# Patient Record
Sex: Female | Born: 1970 | Race: Black or African American | Hispanic: No | Marital: Single | Smoking: Current every day smoker
Health system: Southern US, Community
[De-identification: ages and names within clinical notes are randomized; demographics above are authoritative.]

## PROBLEM LIST (undated history)

## (undated) DIAGNOSIS — F32A Depression, unspecified: Secondary | ICD-10-CM

## (undated) DIAGNOSIS — G5600 Carpal tunnel syndrome, unspecified upper limb: Secondary | ICD-10-CM

## (undated) DIAGNOSIS — M1990 Unspecified osteoarthritis, unspecified site: Secondary | ICD-10-CM

## (undated) DIAGNOSIS — E119 Type 2 diabetes mellitus without complications: Secondary | ICD-10-CM

## (undated) DIAGNOSIS — E669 Obesity, unspecified: Secondary | ICD-10-CM

## (undated) DIAGNOSIS — J45909 Unspecified asthma, uncomplicated: Secondary | ICD-10-CM

## (undated) DIAGNOSIS — K219 Gastro-esophageal reflux disease without esophagitis: Secondary | ICD-10-CM

## (undated) DIAGNOSIS — F419 Anxiety disorder, unspecified: Secondary | ICD-10-CM

## (undated) DIAGNOSIS — F329 Major depressive disorder, single episode, unspecified: Secondary | ICD-10-CM

## (undated) DIAGNOSIS — M549 Dorsalgia, unspecified: Secondary | ICD-10-CM

## (undated) HISTORY — PX: TUBAL LIGATION: SHX77

## (undated) HISTORY — DX: Carpal tunnel syndrome, unspecified upper limb: G56.00

## (undated) HISTORY — DX: Major depressive disorder, single episode, unspecified: F32.9

## (undated) HISTORY — DX: Unspecified osteoarthritis, unspecified site: M19.90

## (undated) HISTORY — PX: CHOLECYSTECTOMY: SHX55

## (undated) HISTORY — DX: Gastro-esophageal reflux disease without esophagitis: K21.9

## (undated) HISTORY — DX: Type 2 diabetes mellitus without complications: E11.9

## (undated) HISTORY — DX: Depression, unspecified: F32.A

---

## 2001-12-16 ENCOUNTER — Encounter: Payer: Self-pay | Admitting: *Deleted

## 2001-12-16 ENCOUNTER — Emergency Department (HOSPITAL_COMMUNITY): Admission: EM | Admit: 2001-12-16 | Discharge: 2001-12-16 | Payer: Self-pay | Admitting: *Deleted

## 2002-01-20 ENCOUNTER — Emergency Department (HOSPITAL_COMMUNITY): Admission: EM | Admit: 2002-01-20 | Discharge: 2002-01-20 | Payer: Self-pay | Admitting: Emergency Medicine

## 2005-01-03 ENCOUNTER — Emergency Department (HOSPITAL_COMMUNITY): Admission: EM | Admit: 2005-01-03 | Discharge: 2005-01-03 | Payer: Self-pay | Admitting: Emergency Medicine

## 2005-06-23 ENCOUNTER — Emergency Department (HOSPITAL_COMMUNITY): Admission: EM | Admit: 2005-06-23 | Discharge: 2005-06-23 | Payer: Self-pay | Admitting: Emergency Medicine

## 2005-09-12 ENCOUNTER — Emergency Department (HOSPITAL_COMMUNITY): Admission: EM | Admit: 2005-09-12 | Discharge: 2005-09-12 | Payer: Self-pay | Admitting: Emergency Medicine

## 2005-10-27 ENCOUNTER — Emergency Department (HOSPITAL_COMMUNITY): Admission: EM | Admit: 2005-10-27 | Discharge: 2005-10-27 | Payer: Self-pay | Admitting: Emergency Medicine

## 2006-06-23 ENCOUNTER — Emergency Department (HOSPITAL_COMMUNITY): Admission: EM | Admit: 2006-06-23 | Discharge: 2006-06-24 | Payer: Self-pay | Admitting: Emergency Medicine

## 2006-11-03 ENCOUNTER — Emergency Department (HOSPITAL_COMMUNITY): Admission: EM | Admit: 2006-11-03 | Discharge: 2006-11-03 | Payer: Self-pay | Admitting: *Deleted

## 2008-09-10 ENCOUNTER — Emergency Department (HOSPITAL_COMMUNITY): Admission: EM | Admit: 2008-09-10 | Discharge: 2008-09-10 | Payer: Self-pay | Admitting: Emergency Medicine

## 2010-05-19 ENCOUNTER — Emergency Department (HOSPITAL_COMMUNITY): Admission: EM | Admit: 2010-05-19 | Discharge: 2010-05-19 | Payer: Self-pay | Admitting: Emergency Medicine

## 2010-12-01 LAB — URINALYSIS, ROUTINE W REFLEX MICROSCOPIC
Glucose, UA: NEGATIVE mg/dL
Hgb urine dipstick: NEGATIVE
Ketones, ur: NEGATIVE mg/dL
Protein, ur: NEGATIVE mg/dL
pH: 5.5 (ref 5.0–8.0)

## 2010-12-01 LAB — CBC
HCT: 40.4 % (ref 36.0–46.0)
Hemoglobin: 13.2 g/dL (ref 12.0–15.0)
MCV: 91.3 fL (ref 78.0–100.0)
Platelets: 239 10*3/uL (ref 150–400)
WBC: 11.8 10*3/uL — ABNORMAL HIGH (ref 4.0–10.5)

## 2010-12-01 LAB — DIFFERENTIAL
Basophils Absolute: 0.1 10*3/uL (ref 0.0–0.1)
Basophils Relative: 1 % (ref 0–1)
Lymphocytes Relative: 31 % (ref 12–46)
Monocytes Absolute: 0.5 10*3/uL (ref 0.1–1.0)
Neutro Abs: 7.5 10*3/uL (ref 1.7–7.7)
Neutrophils Relative %: 63 % (ref 43–77)

## 2010-12-01 LAB — PREGNANCY, URINE: Preg Test, Ur: NEGATIVE

## 2010-12-01 LAB — COMPREHENSIVE METABOLIC PANEL
Albumin: 3.7 g/dL (ref 3.5–5.2)
Alkaline Phosphatase: 77 U/L (ref 39–117)
BUN: 10 mg/dL (ref 6–23)
Chloride: 102 mEq/L (ref 96–112)
Creatinine, Ser: 0.83 mg/dL (ref 0.4–1.2)
GFR calc non Af Amer: >60 mL/min (ref >60)
Glucose, Bld: 104 mg/dL — ABNORMAL HIGH (ref 70–99)
Potassium: 3.6 mEq/L (ref 3.5–5.1)
Total Bilirubin: 0.3 mg/dL (ref 0.3–1.2)

## 2011-11-06 ENCOUNTER — Encounter (HOSPITAL_COMMUNITY): Payer: Self-pay

## 2011-11-06 ENCOUNTER — Emergency Department (HOSPITAL_COMMUNITY): Payer: Self-pay

## 2011-11-06 ENCOUNTER — Emergency Department (HOSPITAL_COMMUNITY)
Admission: EM | Admit: 2011-11-06 | Discharge: 2011-11-06 | Disposition: A | Discharge status: Home Health | Payer: Self-pay | Attending: Emergency Medicine | Admitting: Emergency Medicine

## 2011-11-06 DIAGNOSIS — F17200 Nicotine dependence, unspecified, uncomplicated: Secondary | ICD-10-CM | POA: Insufficient documentation

## 2011-11-06 DIAGNOSIS — R040 Epistaxis: Secondary | ICD-10-CM | POA: Insufficient documentation

## 2011-11-06 DIAGNOSIS — IMO0001 Reserved for inherently not codable concepts without codable children: Secondary | ICD-10-CM | POA: Insufficient documentation

## 2011-11-06 DIAGNOSIS — R61 Generalized hyperhidrosis: Secondary | ICD-10-CM | POA: Insufficient documentation

## 2011-11-06 DIAGNOSIS — R209 Unspecified disturbances of skin sensation: Secondary | ICD-10-CM | POA: Insufficient documentation

## 2011-11-06 DIAGNOSIS — R059 Cough, unspecified: Secondary | ICD-10-CM | POA: Insufficient documentation

## 2011-11-06 DIAGNOSIS — R6883 Chills (without fever): Secondary | ICD-10-CM | POA: Insufficient documentation

## 2011-11-06 DIAGNOSIS — J40 Bronchitis, not specified as acute or chronic: Secondary | ICD-10-CM | POA: Insufficient documentation

## 2011-11-06 DIAGNOSIS — R112 Nausea with vomiting, unspecified: Secondary | ICD-10-CM | POA: Insufficient documentation

## 2011-11-06 DIAGNOSIS — M791 Myalgia, unspecified site: Secondary | ICD-10-CM

## 2011-11-06 DIAGNOSIS — R05 Cough: Secondary | ICD-10-CM | POA: Insufficient documentation

## 2011-11-06 LAB — COMPREHENSIVE METABOLIC PANEL
AST: 25 U/L (ref 0–37)
Albumin: 3.5 g/dL (ref 3.5–5.2)
Alkaline Phosphatase: 83 U/L (ref 39–117)
BUN: 5 mg/dL — ABNORMAL LOW (ref 6–23)
CO2: 25 mEq/L (ref 19–32)
Chloride: 104 mEq/L (ref 96–112)
Creatinine, Ser: 0.7 mg/dL (ref 0.50–1.10)
GFR calc non Af Amer: >90 mL/min (ref >90)
Potassium: 3.9 mEq/L (ref 3.5–5.1)
Total Bilirubin: 0.1 mg/dL — ABNORMAL LOW (ref 0.3–1.2)

## 2011-11-06 LAB — DIFFERENTIAL
Basophils Absolute: 0.1 10*3/uL (ref 0.0–0.1)
Basophils Relative: 1 % (ref 0–1)
Eosinophils Absolute: 0.1 10*3/uL (ref 0.0–0.7)
Eosinophils Relative: 3 % (ref 0–5)
Lymphocytes Relative: 46 % (ref 12–46)
Lymphs Abs: 2.1 10*3/uL (ref 0.7–4.0)
Monocytes Absolute: 0.7 10*3/uL (ref 0.1–1.0)
Monocytes Relative: 15 % — ABNORMAL HIGH (ref 3–12)
Neutro Abs: 1.6 10*3/uL — ABNORMAL LOW (ref 1.7–7.7)
Neutrophils Relative %: 35 % — ABNORMAL LOW (ref 43–77)

## 2011-11-06 LAB — CBC
HCT: 41.6 % (ref 36.0–46.0)
Hemoglobin: 13.6 g/dL (ref 12.0–15.0)
MCHC: 32.7 g/dL (ref 30.0–36.0)
RBC: 4.69 MIL/uL (ref 3.87–5.11)
WBC: 4.5 10*3/uL (ref 4.0–10.5)

## 2011-11-06 MED ORDER — KETOROLAC TROMETHAMINE 60 MG/2ML IM SOLN
60.0000 mg | Freq: Once | INTRAMUSCULAR | Status: AC
  Administered 2011-11-06: 60 mg via INTRAMUSCULAR
  Filled 2011-11-06: qty 2

## 2011-11-06 MED ORDER — HYDROCODONE-ACETAMINOPHEN 5-325 MG PO TABS: 2.0000 | ORAL_TABLET | ORAL | Status: AC | PRN

## 2011-11-06 MED ORDER — ONDANSETRON 4 MG PO TBDP
4.0000 mg | ORAL_TABLET | Freq: Once | ORAL | Status: AC
  Administered 2011-11-06: 4 mg via ORAL
  Filled 2011-11-06: qty 1

## 2011-11-06 MED ORDER — AZITHROMYCIN 250 MG PO TABS: 250.0000 mg | ORAL_TABLET | Freq: Every day | ORAL | Status: AC

## 2011-11-06 MED ORDER — PROMETHAZINE HCL 25 MG PO TABS: 25.0000 mg | ORAL_TABLET | Freq: Four times a day (QID) | ORAL | Status: DC | PRN

## 2011-11-06 NOTE — ED Notes (Signed)
Patient states that since last week she had a stomach virus with n/v/d. She states that in the past 3 days she has now developed left sided numbness on her left arm as well. She states that this morning when she woke up she had a nosebleed that was severe in nature. Pt thinks that she still has the stomach virus and the flu. She describes chills and sweats. She states that she has been having productive cough as well.

## 2011-11-06 NOTE — ED Provider Notes (Signed)
History     CSN: [id number]  Arrival date & time 11/06/11  9518   First MD Initiated Contact with Patient 11/06/11 [phone]      Chief Complaint  Patient presents with  . Influenza  . Numbness     HPI Patient states that since last week she had a stomach virus with n/v/d. She states that in the past 3 days she has now developed left sided numbness on her left arm as well. She states that this morning when she woke up she had a nosebleed that was severe in nature. Pt thinks that she still has the stomach virus and the flu. She describes chills and sweats. She states that she has been having productive cough as well.   History reviewed. No pertinent past medical history.  Past Surgical History  Procedure Date  . Cholecystectomy     History reviewed. No pertinent family history.  History  Substance Use Topics  . Smoking status: Current Everyday Smoker -- 1.0 packs/day  . Smokeless tobacco: Not on file  . Alcohol Use: No    OB History    Grav Para Term Preterm Abortions TAB SAB Ect Mult Living                  Review of Systems Negative except as noted in history of present illness Allergies  Penicillins  Home Medications   Current Outpatient Rx  Name Route Sig Dispense Refill  . ACETAMINOPHEN 500 MG PO TABS Oral Take 500 mg by mouth every 6 (six) hours as needed. Pain/ fever    . IBUPROFEN 200 MG PO TABS Oral Take 400 mg by mouth every 6 (six) hours as needed. Pain/fever    . DAYQUIL MULTI-SYMPTOM PO Oral Take 2 tablets by mouth every 6 (six) hours as needed. For cold    . AZITHROMYCIN 250 MG PO TABS Oral Take 1 tablet (250 mg total) by mouth daily. Take 2 tablets on day 1 then take 1 tablet daily until gone 66 tablet 0  . HYDROCODONE-ACETAMINOPHEN 5-325 MG PO TABS Oral Take 2 tablets by mouth every 4 (four) hours as needed for pain. 10 tablet 0  . PROMETHAZINE HCL 25 MG PO TABS Oral Take 1 tablet (25 mg total) by mouth every 6 (six) hours as needed for nausea. 15  tablet 0    BP 102/57  Pulse 83  Temp(Src) 99.9 F (37.7 C) (Oral)  Resp 22  SpO2 97%  LMP 11/06/2011  Physical Exam  Nursing note and vitals reviewed. Constitutional: She is oriented to person, place, and time. She appears well-developed and well-nourished. No distress.  HENT:  Head: Normocephalic and atraumatic.  Eyes: Pupils are equal, round, and reactive to light.  Neck: Normal range of motion.  Cardiovascular: Normal rate and intact distal pulses.   Pulmonary/Chest: No respiratory distress. She has no wheezes. She has no rales.  Abdominal: Normal appearance. She exhibits no distension. There is no tenderness. There is no rebound and no guarding.  Musculoskeletal: Normal range of motion.  Neurological: She is alert and oriented to person, place, and time. No cranial nerve deficit.  Skin: Skin is warm and dry. No rash noted.  Psychiatric: She has a normal mood and affect. Her behavior is normal.    ED Course  Procedures (including critical care time)  Labs Reviewed  DIFFERENTIAL - Abnormal; Notable for the following:    Neutrophils Relative 35 (*)    Neutro Abs 1.6 (*)    Monocytes Relative 15 (*)  All other components within normal limits  COMPREHENSIVE METABOLIC PANEL - Abnormal; Notable for the following:    Glucose, Bld 113 (*)    BUN 5 (*)    Total Bilirubin 0.1 (*)    All other components within normal limits  CBC   Dg Chest 2 View  11/06/2011  *RADIOLOGY REPORT*  Clinical Data: Productive cough, chest soreness, smoking history  CHEST - 2 VIEW  Comparison: None.  Findings: No pneumonia is seen.  There are somewhat prominent perihilar markings with peribronchial thickening which may indicate bronchitis.  The heart is mildly enlarged.  No bony abnormality is seen.  IMPRESSION: Prominent perihilar markings may indicate bronchitis.  No pneumonia is seen.  Original Report Authenticated By: Juline Patch, M.D.     1. Nausea and vomiting   2. Myalgia   3.  Bronchitis       MDM          Nelia Shi, MD 11/06/11 1135

## 2011-11-06 NOTE — Discharge Instructions (Signed)
B.R.A.T. Diet Your doctor has recommended the B.R.A.T. diet for you or your child until the condition improves. This is often used to help control diarrhea and vomiting symptoms. If you or your child can tolerate clear liquids, you may have:  Bananas.   Rice.   Applesauce.   Toast (and other simple starches such as crackers, potatoes, noodles).  Be sure to avoid dairy products, meats, and fatty foods until symptoms are better. Fruit juices such as apple, grape, and prune juice can make diarrhea worse. Avoid these. Continue this diet for 2 days or as instructed by your caregiver. Document Released: 08/03/2005 Document Revised: 07/23/2011 Document Reviewed: 01/20/2007 [facility] Patient Information 2012 [place], [place].Bronchitis Bronchitis is the body's way of reacting to injury and/or infection (inflammation) of the bronchi. Bronchi are the air tubes that extend from the windpipe into the lungs. If the inflammation becomes severe, it may cause shortness of breath. CAUSES  Inflammation may be caused by:  A virus.   Germs (bacteria).   Dust.   Allergens.   Pollutants and many other irritants.  The cells lining the bronchial tree are covered with tiny hairs (cilia). These constantly beat upward, away from the lungs, toward the mouth. This keeps the lungs free of pollutants. When these cells become too irritated and are unable to do their job, mucus begins to develop. This causes the characteristic cough of bronchitis. The cough clears the lungs when the cilia are unable to do their job. Without either of these protective mechanisms, the mucus would settle in the lungs. Then you would develop pneumonia. Smoking is a common cause of bronchitis and can contribute to pneumonia. Stopping this habit is the single most important thing you can do to help yourself. TREATMENT   Your caregiver may prescribe an antibiotic if the cough is caused by bacteria. Also, medicines that open up your airways make  it easier to breathe. Your caregiver may also recommend or prescribe an expectorant. It will loosen the mucus to be coughed up. Only take over-the-counter or prescription medicines for pain, discomfort, or fever as directed by your caregiver.   Removing whatever causes the problem (smoking, for example) is critical to preventing the problem from getting worse.   Cough suppressants may be prescribed for relief of cough symptoms.   Inhaled medicines may be prescribed to help with symptoms now and to help prevent problems from returning.   For those with recurrent (chronic) bronchitis, there may be a need for steroid medicines.  SEEK IMMEDIATE MEDICAL CARE IF:   During treatment, you develop more pus-like mucus (purulent sputum).   You have a fever.   Your baby is older than 3 months with a rectal temperature of 102 F (38.9 C) or higher.   Your baby is 28 months old or younger with a rectal temperature of 100.4 F (38 C) or higher.   You become progressively more ill.   You have increased difficulty breathing, wheezing, or shortness of breath.  It is necessary to seek immediate medical care if you are elderly or sick from any other disease. MAKE SURE YOU:   Understand these instructions.   Will watch your condition.   Will get help right away if you are not doing well or get worse.  Document Released: 08/03/2005 Document Revised: 07/23/2011 Document Reviewed: 06/12/2008 [facility] Patient Information 2012 [place], [place].

## 2011-11-06 NOTE — ED Notes (Signed)
Patient transported to X-ray 

## 2011-11-06 NOTE — ED Notes (Addendum)
Pt reports this week nausea, vomiting, diarrhea but these symptoms have since stopped x 2 days. Today, pt c/o headache, chills, cough, "spitting up blood". Pt reports nosebleed this morning, since then has been coughing up blood. Pt skin feels warm. Pt has mask in place. Family at bedside. Pt c/o "aches". Pt also c/o "numbness" in left arm. No neuro deficits noted.

## 2012-05-04 ENCOUNTER — Encounter: Payer: Self-pay | Admitting: Family Medicine

## 2012-05-04 ENCOUNTER — Ambulatory Visit (INDEPENDENT_AMBULATORY_CARE_PROVIDER_SITE_OTHER): Payer: Self-pay | Admitting: Family Medicine

## 2012-05-04 DIAGNOSIS — F329 Major depressive disorder, single episode, unspecified: Secondary | ICD-10-CM

## 2012-05-04 DIAGNOSIS — Z720 Tobacco use: Secondary | ICD-10-CM

## 2012-05-04 DIAGNOSIS — G8929 Other chronic pain: Secondary | ICD-10-CM

## 2012-05-04 DIAGNOSIS — F32A Depression, unspecified: Secondary | ICD-10-CM

## 2012-05-04 DIAGNOSIS — F17200 Nicotine dependence, unspecified, uncomplicated: Secondary | ICD-10-CM

## 2012-05-04 MED ORDER — IBUPROFEN 600 MG PO TABS: 600.0000 mg | ORAL_TABLET | Freq: Three times a day (TID) | ORAL | Status: DC | PRN

## 2012-05-04 NOTE — Patient Instructions (Addendum)
Nice to meet you! Please bring in your empty pill bottles to next visit so I can know the name of your depression medication. You might have sleep apnea, and we need to do some bloodwork and pap test (physical exam). Make sure to get the orange card, so we can do the testing you need! Try to stay active and get as much exercise as possible for weight loss. Make an appointment as soon as you can after the orange card.   Degenerative Arthritis You have osteoarthritis. This is the wear and tear arthritis that comes with aging. It is also called degenerative arthritis. This is common in people past middle age. It is caused by stress on the joints. The large weight bearing joints of the lower extremities are most often affected. The knees, hips, back, neck, and hands can become painful, swollen, and stiff. This is the most common type of arthritis. It comes on with age, carrying too much weight, or from an injury. Treatment includes resting the sore joint until the pain and swelling improve. Crutches or a walker may be needed for severe flares. Only take over-the-counter or prescription medicines for pain, discomfort, or fever as directed by your caregiver. Local heat therapy may improve motion. Cortisone shots into the joint are sometimes used to reduce pain and swelling during flares. Osteoarthritis is usually not crippling and progresses slowly. There are things you can do to decrease pain:  Avoid high impact activities.   Exercise regularly.   Low impact exercises such as walking, biking and swimming help to keep the muscles strong and keep normal joint function.   Stretching helps to keep your range of motion.   Lose weight if you are overweight. This reduces joint stress.  In severe cases when you have pain at rest or increasing disability, joint surgery may be helpful. See your caregiver for follow-up treatment as recommended.  SEEK IMMEDIATE MEDICAL CARE IF:   You have severe joint pain.     Marked swelling and redness in your joint develops.   You develop a high fever.  Document Released: 08/03/2005 Document Revised: 07/23/2011 Document Reviewed: 01/03/2007 [facility] Patient Information 2012 [place], [place].
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
## 2012-05-04 NOTE — Assessment & Plan Note (Signed)
Discussed weight as an exacerbating factor for joint pains and likely sleep apnea. She describes symptoms consistent with apnea, will plan to obtain a sleep study once she is approved for the orange card. Reinforced weight loss currently. Consider nutrition versus bariatric referral, though not likely unable to afford this. Will check A1c, lipids, TSH at next visit.

## 2012-05-04 NOTE — Assessment & Plan Note (Signed)
Complains of chronic polyarticular symmetric pain. Has not seen a doctor in greater than 10 years. There are plain films of her knees taken 10 years ago that looked normal. Will start trial of NSAID in addition to her Tylenol. Has followup planned to screen for inflammatory conditions with ESR, CBC differential, cmet, TSH. Possibly this represents simple arthritic changes related to morbid obesity. Followup as soon as orange card is obtained.

## 2012-05-04 NOTE — Assessment & Plan Note (Signed)
Symptoms are not bothersome to patient currently. Advised her to bring old pill bottles to next visit, so that this medication may be re-prescribed if beneficial, may try to target chronic pain with a TCA or SNRI. Check PHQ-9 and MDQ at next visit.

## 2012-05-05 ENCOUNTER — Telehealth: Payer: Self-pay | Admitting: Family Medicine

## 2012-05-05 NOTE — Telephone Encounter (Signed)
Pt called to say she couldn't get the info for [employer] and she wanted to know what she could do.  States that the visit yesterday was not a real office visit and she wanted a refund of her $19 - the doctor would give her what she needed and she is upset that we aren't going to help her. I explained what the process is and that she would need to help get the info together (she hasn't filed taxes since 1998) and something about her food stamps verification. She states that she is not coming back here so I have taken her off as our patient at [facility] medicine.

## 2013-01-13 ENCOUNTER — Encounter (HOSPITAL_COMMUNITY): Payer: Self-pay | Admitting: Emergency Medicine

## 2013-01-13 ENCOUNTER — Emergency Department (HOSPITAL_COMMUNITY)
Admission: EM | Admit: 2013-01-13 | Discharge: 2013-01-13 | Disposition: A | Discharge status: Home / Self Care | Payer: Self-pay | Attending: Emergency Medicine | Admitting: Emergency Medicine

## 2013-01-13 DIAGNOSIS — M170 Bilateral primary osteoarthritis of knee: Secondary | ICD-10-CM

## 2013-01-13 DIAGNOSIS — IMO0002 Reserved for concepts with insufficient information to code with codable children: Secondary | ICD-10-CM | POA: Insufficient documentation

## 2013-01-13 DIAGNOSIS — Z880 Allergy status to penicillin: Secondary | ICD-10-CM | POA: Insufficient documentation

## 2013-01-13 DIAGNOSIS — F17200 Nicotine dependence, unspecified, uncomplicated: Secondary | ICD-10-CM | POA: Insufficient documentation

## 2013-01-13 DIAGNOSIS — M1710 Unilateral primary osteoarthritis, unspecified knee: Secondary | ICD-10-CM | POA: Insufficient documentation

## 2013-01-13 DIAGNOSIS — Z8659 Personal history of other mental and behavioral disorders: Secondary | ICD-10-CM | POA: Insufficient documentation

## 2013-01-13 HISTORY — DX: Obesity, unspecified: E66.9

## 2013-01-13 MED ORDER — IBUPROFEN 800 MG PO TABS
800.0000 mg | ORAL_TABLET | Freq: Once | ORAL | Status: AC
  Administered 2013-01-13: 800 mg via ORAL
  Filled 2013-01-13: qty 1

## 2013-01-13 NOTE — ED Provider Notes (Signed)
Medical screening examination/treatment/procedure(s) were performed by non-physician practitioner and as supervising physician I was immediately available for consultation/collaboration.   Joya Gaskins, MD 01/13/13 1013

## 2013-01-13 NOTE — ED Provider Notes (Signed)
History     CSN: [id number]  Arrival date & time 01/13/13  4098   First MD Initiated Contact with Patient 01/13/13 [phone]      Chief Complaint  Patient presents with  . Knee Pain    (Consider location/radiation/quality/duration/timing/severity/associated sxs/prior treatment) HPI  This is a morbidly obese 42 year old female with history of osteoarthritis presents complaining of bilateral knee pain.patient reports she has chronic knee pain for many years. Pain is described as a sharp and throbbing sensation to bilateral knee, worsening with weather changes. She usually takes Tylenol, using aspercreme for pain with minimal relief. No complaints of hip or ankle pain. Denies fever, swelling, or rash. Denies any recent trauma. She reports that her lawyer recommend coming to the ER for management of her knee pain in order to file for disability.  patient is able to ambulate without any assistive device  Past Medical History  Diagnosis Date  . Depression   . Arthritis     Past Surgical History  Procedure Laterality Date  . Cholecystectomy    . Tubal ligation      Family History  Problem Relation Age of Onset  . Depression Mother     bipolar/schizo  . Heart disease Mother   . Diabetes Mother   . Asthma Father   . Hyperlipidemia Father   . Hypertension Father   . Depression Maternal Grandmother   . Heart disease Maternal Grandmother   . Diabetes Maternal Grandmother   . Hypertension Maternal Grandfather     History  Substance Use Topics  . Smoking status: Current Every Day Smoker -- 1.00 packs/day  . Smokeless tobacco: Not on file  . Alcohol Use: No    OB History   Grav Para Term Preterm Abortions TAB SAB Ect Mult Living                  Review of Systems  Constitutional: Negative for fever.  Musculoskeletal: Positive for arthralgias. Negative for back pain and joint swelling.  Skin: Negative for rash and wound.  Neurological: Negative for numbness.    Allergies   Penicillins  Home Medications   Current Outpatient Rx  Name  Route  Sig  Dispense  Refill  . trolamine salicylate (ASPERCREME) 10 % cream   Topical   Apply 1 application topically as needed (For pain.).           There were no vitals taken for this visit.  Physical Exam  Nursing note and vitals reviewed. Constitutional:  Morbidly obese, appears to be in no acute distress  HENT:  Head: Normocephalic and atraumatic.  Neck: Neck supple.  Cardiovascular: Intact distal pulses.   Musculoskeletal: She exhibits tenderness (Generalized tenderness to bilateral anterior and posterior knee on palpation with normal range of motion, no joint laxity, no overlying skin changes, or edema noted). She exhibits no edema.       Right hip: Normal.       Left hip: Normal.       Right ankle: Normal.       Left ankle: Normal.  Neurological: She is alert.  Skin: Skin is warm. No rash noted.  Psychiatric: She has a normal mood and affect.    ED Course  Procedures (including critical care time)  9:18 AM Patient with polys arthralgia consistence with osteoarthritis. No red flags. Able to ambulate. Patient likely will benefit from diet, exercise, and weight loss along with rice therapy. [facility] for the  same complaint, was prescribed NSAIDs however patient unsatisfied with treatment. I do not believe narcotic pain medication is appropriate for this patient.  Time spent discussing weight reduction, diet and exercise, and followup with ortho as needed. Patient voiced understanding and agrees with plan. Patient stable for discharge.  Labs Reviewed - No data to display No results found.   1. Osteoarthritis of both knees       MDM  BP 95/57  Pulse 79  Temp(Src) 98.3 F (36.8 C)  Resp 16  SpO2 99%         Fayrene Helper, PA-C 01/13/13 [phone]

## 2013-01-13 NOTE — ED Notes (Signed)
Rt knee pain  X years but has hurt worse the last 4 months now has some swelling has never seen a dr for this hurts to walk it no injury she states

## 2013-07-04 ENCOUNTER — Emergency Department (HOSPITAL_COMMUNITY)
Admission: EM | Admit: 2013-07-04 | Discharge: 2013-07-05 | Disposition: A | Discharge status: Home / Self Care | Payer: Self-pay | Attending: Emergency Medicine | Admitting: Emergency Medicine

## 2013-07-04 ENCOUNTER — Encounter (HOSPITAL_COMMUNITY): Payer: Self-pay | Admitting: Emergency Medicine

## 2013-07-04 DIAGNOSIS — F17200 Nicotine dependence, unspecified, uncomplicated: Secondary | ICD-10-CM | POA: Insufficient documentation

## 2013-07-04 DIAGNOSIS — E669 Obesity, unspecified: Secondary | ICD-10-CM | POA: Insufficient documentation

## 2013-07-04 DIAGNOSIS — R112 Nausea with vomiting, unspecified: Secondary | ICD-10-CM | POA: Insufficient documentation

## 2013-07-04 DIAGNOSIS — Z880 Allergy status to penicillin: Secondary | ICD-10-CM | POA: Insufficient documentation

## 2013-07-04 DIAGNOSIS — Z8659 Personal history of other mental and behavioral disorders: Secondary | ICD-10-CM | POA: Insufficient documentation

## 2013-07-04 DIAGNOSIS — Z3202 Encounter for pregnancy test, result negative: Secondary | ICD-10-CM | POA: Insufficient documentation

## 2013-07-04 DIAGNOSIS — R109 Unspecified abdominal pain: Secondary | ICD-10-CM | POA: Insufficient documentation

## 2013-07-04 DIAGNOSIS — Z8739 Personal history of other diseases of the musculoskeletal system and connective tissue: Secondary | ICD-10-CM | POA: Insufficient documentation

## 2013-07-04 DIAGNOSIS — M549 Dorsalgia, unspecified: Secondary | ICD-10-CM | POA: Insufficient documentation

## 2013-07-04 MED ORDER — ONDANSETRON HCL 4 MG/2ML IJ SOLN
4.0000 mg | Freq: Once | INTRAMUSCULAR | Status: AC
  Administered 2013-07-05: 4 mg via INTRAVENOUS
  Filled 2013-07-04: qty 2

## 2013-07-04 MED ORDER — SODIUM CHLORIDE 0.9 % IV BOLUS (SEPSIS)
1000.0000 mL | Freq: Once | INTRAVENOUS | Status: AC
  Administered 2013-07-05: 1000 mL via INTRAVENOUS

## 2013-07-04 MED ORDER — KETOROLAC TROMETHAMINE 30 MG/ML IJ SOLN
30.0000 mg | Freq: Once | INTRAMUSCULAR | Status: AC
  Administered 2013-07-05: 30 mg via INTRAVENOUS
  Filled 2013-07-04: qty 1

## 2013-07-04 NOTE — ED Notes (Signed)
Pt with back and abd pain with nausea and also noted "speck of blood" on toliet paper per pt with last BM tonight, denies vomiting

## 2013-07-04 NOTE — ED Provider Notes (Signed)
CSN: [id number]     Arrival date & time 07/04/13  2235 History  This chart was scribed for Geoffery Lyons, MD by Ronal Fear, ED Scribe. This patient was seen in room APA11/APA11 and the patient's care was started at 11:33 PM.    Chief Complaint  Patient presents with  . Back Pain  . Abdominal Pain   (Consider location/radiation/quality/duration/timing/severity/associated sxs/prior Treatment) HPI  HPI Comments: Sandra Orr is a 42 y.o. female who presents to the Emergency Department complaining of sudden onset back pain for 4x days, with nausea. Pt states that she went to the bathroom today and during an episode of emesis she saw dark brown blood. She denies any heavy lifting, fever, diarrhea, or dysuria. She denies hx of UTI or bladder infection, and diverticulitis. Pt had her tubes tied 25 yrs ago.  She does not appear to be in any acute distress with no other complaints.    Past Medical History  Diagnosis Date  . Depression   . Arthritis   . Obesity    Past Surgical History  Procedure Laterality Date  . Cholecystectomy    . Tubal ligation     Family History  Problem Relation Age of Onset  . Depression Mother     bipolar/schizo  . Heart disease Mother   . Diabetes Mother   . Asthma Father   . Hyperlipidemia Father   . Hypertension Father   . Depression Maternal Grandmother   . Heart disease Maternal Grandmother   . Diabetes Maternal Grandmother   . Hypertension Maternal Grandfather    History  Substance Use Topics  . Smoking status: Current Every Day Smoker -- 1.00 packs/day  . Smokeless tobacco: Not on file  . Alcohol Use: No   OB History   Grav Para Term Preterm Abortions TAB SAB Ect Mult Living                 Review of Systems  Constitutional: Negative for fever.  Gastrointestinal: Positive for nausea, vomiting and abdominal pain. Negative for diarrhea and blood in stool.  Genitourinary: Negative for dysuria and hematuria.  All other systems reviewed  and are negative.    Allergies  Penicillins  Home Medications   Current Outpatient Rx  Name  Route  Sig  Dispense  Refill  . trolamine salicylate (ASPERCREME) 10 % cream   Topical   Apply 1 application topically as needed (For pain.).          BP 130/86  Pulse 80  Temp(Src) 97.8 F (36.6 C) (Oral)  Resp 22  Ht 5\' 4"  (1.626 m)  Wt 353 lb (160.12 kg)  BMI 60.56 kg/m2  SpO2 99%  LMP 06/02/2013 Physical Exam  Nursing note and vitals reviewed. Constitutional: She is oriented to person, place, and time. She appears well-developed and well-nourished. No distress.  HENT:  Head: Normocephalic and atraumatic.  Eyes: EOM are normal.  Neck: Neck supple. No tracheal deviation present.  Cardiovascular: Normal rate.   Pulmonary/Chest: Effort normal. No respiratory distress.  Abdominal: Soft. Bowel sounds are normal. There is tenderness. There is no rebound and no guarding.  Mild tenderness to palpation in all four quadrants with no rebound and no guarding.   Musculoskeletal: Normal range of motion. She exhibits no tenderness.  Neurological: She is alert and oriented to person, place, and time.  Skin: Skin is warm and dry.  Psychiatric: She has a normal mood and affect. Her behavior is normal.    ED Course  Procedures (  including critical care time) DIAGNOSTIC STUDIES: Oxygen Saturation is 99% on RA, normal by my interpretation.    COORDINATION OF CARE:    11:37 PM- Pt advised of plan for treatment IV and anti emetic pt agrees.  Labs Review Labs Reviewed  URINALYSIS, ROUTINE W REFLEX MICROSCOPIC  PREGNANCY, URINE   Imaging Review No results found.    MDM  No diagnosis found. Patient is a 42 year old female presents to the emergency department with complaints of abdominal pain and nausea for the past 2 days. She denies any diarrhea. Workup reveals a mildly elevated white count of 11.7 and urinalysis is unremarkable. She is having mild tenderness to palpation  throughout the abdomen however there were no focal areas of tenderness. CT scan is negative for any intra-abdominal pathology. This does not appear to be appendicitis and she is status post cholecystectomy. This point feels that she is stable for discharge. I will give her a small number of pain pills and advised to return if her symptoms worsen or change.  I personally performed the services described in this documentation, which was scribed in my presence. The recorded information has been reviewed and is accurate.      Geoffery Lyons, MD 07/05/13 [phone]

## 2013-07-05 ENCOUNTER — Emergency Department (HOSPITAL_COMMUNITY): Payer: Self-pay

## 2013-07-05 ENCOUNTER — Emergency Department (HOSPITAL_COMMUNITY)
Admission: EM | Admit: 2013-07-05 | Discharge: 2013-07-06 | Disposition: A | Discharge status: Home / Self Care | Payer: Self-pay | Attending: Emergency Medicine | Admitting: Emergency Medicine

## 2013-07-05 ENCOUNTER — Encounter (HOSPITAL_COMMUNITY): Payer: Self-pay | Admitting: Emergency Medicine

## 2013-07-05 DIAGNOSIS — E669 Obesity, unspecified: Secondary | ICD-10-CM | POA: Insufficient documentation

## 2013-07-05 DIAGNOSIS — R109 Unspecified abdominal pain: Secondary | ICD-10-CM | POA: Insufficient documentation

## 2013-07-05 DIAGNOSIS — M545 Low back pain, unspecified: Secondary | ICD-10-CM | POA: Insufficient documentation

## 2013-07-05 DIAGNOSIS — K625 Hemorrhage of anus and rectum: Secondary | ICD-10-CM

## 2013-07-05 DIAGNOSIS — M129 Arthropathy, unspecified: Secondary | ICD-10-CM | POA: Insufficient documentation

## 2013-07-05 DIAGNOSIS — R110 Nausea: Secondary | ICD-10-CM | POA: Insufficient documentation

## 2013-07-05 DIAGNOSIS — Z880 Allergy status to penicillin: Secondary | ICD-10-CM | POA: Insufficient documentation

## 2013-07-05 DIAGNOSIS — F17200 Nicotine dependence, unspecified, uncomplicated: Secondary | ICD-10-CM | POA: Insufficient documentation

## 2013-07-05 DIAGNOSIS — Z8659 Personal history of other mental and behavioral disorders: Secondary | ICD-10-CM | POA: Insufficient documentation

## 2013-07-05 DIAGNOSIS — K649 Unspecified hemorrhoids: Secondary | ICD-10-CM | POA: Insufficient documentation

## 2013-07-05 LAB — URINALYSIS, ROUTINE W REFLEX MICROSCOPIC
Glucose, UA: NEGATIVE mg/dL
Nitrite: NEGATIVE
Protein, ur: NEGATIVE mg/dL
Specific Gravity, Urine: >1.03 — ABNORMAL HIGH (ref 1.005–1.030)
Urobilinogen, UA: 0.2 mg/dL (ref 0.0–1.0)
Urobilinogen, UA: 0.2 mg/dL (ref 0.0–1.0)

## 2013-07-05 LAB — ETHANOL: Alcohol, Ethyl (B): <11 mg/dL (ref 0–11)

## 2013-07-05 LAB — URINE MICROSCOPIC-ADD ON

## 2013-07-05 LAB — COMPREHENSIVE METABOLIC PANEL
ALT: 17 U/L (ref 0–35)
Albumin: 4 g/dL (ref 3.5–5.2)
Albumin: 4.1 g/dL (ref 3.5–5.2)
BUN: 10 mg/dL (ref 6–23)
CO2: 28 mEq/L (ref 19–32)
Calcium: 10 mg/dL (ref 8.4–10.5)
Calcium: 9.8 mg/dL (ref 8.4–10.5)
Creatinine, Ser: 0.68 mg/dL (ref 0.50–1.10)
Creatinine, Ser: 0.68 mg/dL (ref 0.50–1.10)
GFR calc Af Amer: >90 mL/min (ref >90)
GFR calc non Af Amer: >90 mL/min (ref >90)
Glucose, Bld: 105 mg/dL — ABNORMAL HIGH (ref 70–99)
Sodium: 138 mEq/L (ref 135–145)
Total Bilirubin: 0.3 mg/dL (ref 0.3–1.2)
Total Protein: 8 g/dL (ref 6.0–8.3)
Total Protein: 8.5 g/dL — ABNORMAL HIGH (ref 6.0–8.3)

## 2013-07-05 LAB — CBC WITH DIFFERENTIAL/PLATELET
Basophils Absolute: 0 10*3/uL (ref 0.0–0.1)
Basophils Absolute: 0 10*3/uL (ref 0.0–0.1)
Basophils Relative: 0 % (ref 0–1)
Basophils Relative: 0 % (ref 0–1)
Eosinophils Absolute: 0.1 10*3/uL (ref 0.0–0.7)
Eosinophils Absolute: 0.2 10*3/uL (ref 0.0–0.7)
Eosinophils Relative: 1 % (ref 0–5)
Eosinophils Relative: 2 % (ref 0–5)
HCT: 41.4 % (ref 36.0–46.0)
Hemoglobin: 13.8 g/dL (ref 12.0–15.0)
Lymphs Abs: 3.7 10*3/uL (ref 0.7–4.0)
MCH: 29.1 pg (ref 26.0–34.0)
MCH: 29.6 pg (ref 26.0–34.0)
MCHC: 32.8 g/dL (ref 30.0–36.0)
MCHC: 33.3 g/dL (ref 30.0–36.0)
MCV: 88.9 fL (ref 78.0–100.0)
Monocytes Absolute: 0.7 10*3/uL (ref 0.1–1.0)
Monocytes Relative: 6 % (ref 3–12)
Neutrophils Relative %: 63 % (ref 43–77)
Platelets: 237 10*3/uL (ref 150–400)
RDW: 15 % (ref 11.5–15.5)
RDW: 15.1 % (ref 11.5–15.5)
WBC: 12.2 10*3/uL — ABNORMAL HIGH (ref 4.0–10.5)

## 2013-07-05 LAB — LIPASE, BLOOD
Lipase: 26 U/L (ref 11–59)
Lipase: 30 U/L (ref 11–59)

## 2013-07-05 LAB — RAPID URINE DRUG SCREEN, HOSP PERFORMED
Cocaine: NOT DETECTED
Opiates: NOT DETECTED

## 2013-07-05 MED ORDER — ONDANSETRON HCL 4 MG/2ML IJ SOLN
4.0000 mg | Freq: Once | INTRAMUSCULAR | Status: AC
  Administered 2013-07-05: 4 mg via INTRAVENOUS
  Filled 2013-07-05: qty 2

## 2013-07-05 MED ORDER — HYDROMORPHONE HCL PF 1 MG/ML IJ SOLN
1.0000 mg | Freq: Once | INTRAMUSCULAR | Status: AC
  Administered 2013-07-05: 1 mg via INTRAVENOUS
  Filled 2013-07-05: qty 1

## 2013-07-05 MED ORDER — SODIUM CHLORIDE 0.9 % IV BOLUS (SEPSIS)
1000.0000 mL | Freq: Once | INTRAVENOUS | Status: AC
  Administered 2013-07-05: 1000 mL via INTRAVENOUS

## 2013-07-05 MED ORDER — IOHEXOL 300 MG/ML  SOLN
120.0000 mL | Freq: Once | INTRAMUSCULAR | Status: AC | PRN
  Administered 2013-07-05: 120 mL via INTRAVENOUS

## 2013-07-05 MED ORDER — HYDROCODONE-ACETAMINOPHEN 5-325 MG PO TABS: 2.0000 | ORAL_TABLET | ORAL | Status: DC | PRN

## 2013-07-05 MED ORDER — OXYCODONE-ACETAMINOPHEN 5-325 MG PO TABS: 1.0000 | ORAL_TABLET | ORAL | Status: DC | PRN

## 2013-07-05 MED ORDER — SODIUM CHLORIDE 0.9 % IV SOLN
INTRAVENOUS | Status: DC
  Administered 2013-07-05: 22:00:00 via INTRAVENOUS

## 2013-07-05 MED ORDER — IOHEXOL 300 MG/ML  SOLN
50.0000 mL | Freq: Once | INTRAMUSCULAR | Status: AC | PRN
  Administered 2013-07-05: 50 mL via ORAL

## 2013-07-05 NOTE — ED Notes (Signed)
Patient complaining of diffuse abdominal pain and mid lower back pain x 4 days. Also reports nausea and black stools. States seen in ED yesterday for the same.

## 2013-07-05 NOTE — ED Provider Notes (Signed)
CSN: [id number]     Arrival date & time 07/05/13  1925 History  This chart was scribed for Sandra Melter, MD by Danella Maiers, ED Scribe. This patient was seen in room APA04/APA04 and the patient's care was started at 8:24 PM.     Chief Complaint  Patient presents with  . Abdominal Pain  . Back Pain   The history is provided by the patient. No language interpreter was used.   HPI Comments: Sandra Orr is a 42 y.o. female who presents to the Emergency Department complaining of diffuse abdominal pain and mid lower back pain for the past four days. Pt reports black stool whereas yesterday was brown. She reports nausea but no vomiting. She has had 3 BMs today. Pt was seen in ED yesterday for same with a negative CT and is back because of the black stools. She denies h/o similar pain. She has a h/o cholecystectomy.   Past Medical History  Diagnosis Date  . Depression   . Arthritis   . Obesity    Past Surgical History  Procedure Laterality Date  . Cholecystectomy    . Tubal ligation     Family History  Problem Relation Age of Onset  . Depression Mother     bipolar/schizo  . Heart disease Mother   . Diabetes Mother   . Asthma Father   . Hyperlipidemia Father   . Hypertension Father   . Depression Maternal Grandmother   . Heart disease Maternal Grandmother   . Diabetes Maternal Grandmother   . Hypertension Maternal Grandfather    History  Substance Use Topics  . Smoking status: Current Every Day Smoker -- 1.00 packs/day  . Smokeless tobacco: Not on file  . Alcohol Use: No   OB History   Grav Para Term Preterm Abortions TAB SAB Ect Mult Living                 Review of Systems  Gastrointestinal: Positive for nausea and abdominal pain. Negative for vomiting.  Musculoskeletal: Positive for back pain.  All other systems reviewed and are negative.    Allergies  Penicillins  Home Medications   Current Outpatient Rx  Name  Route  Sig  Dispense  Refill  .  trolamine salicylate (ASPERCREME) 10 % cream   Topical   Apply 1 application topically as needed (For pain.).         Marland Kitchen HYDROcodone-acetaminophen (NORCO) 5-325 MG per tablet   Oral   Take 2 tablets by mouth every 4 (four) hours as needed.   10 tablet   0   . oxyCODONE-acetaminophen (PERCOCET/ROXICET) 5-325 MG per tablet   Oral   Take 1 tablet by mouth every 4 (four) hours as needed.   6 tablet   0    BP 130/53  Pulse 89  Temp(Src) 98.3 F (36.8 C) (Oral)  Resp 20  Ht 5\' 4"  (1.626 m)  Wt 350 lb (158.759 kg)  BMI 60.05 kg/m2  SpO2 100%  LMP 06/02/2013 Physical Exam  Nursing note and vitals reviewed. Constitutional: She is oriented to person, place, and time. She appears well-developed and well-nourished.  HENT:  Head: Normocephalic and atraumatic.  Eyes: Conjunctivae and EOM are normal. Pupils are equal, round, and reactive to light.  Neck: Normal range of motion and phonation normal. Neck supple.  Cardiovascular: Normal rate, regular rhythm and intact distal pulses.   Pulmonary/Chest: Effort normal and breath sounds normal. She exhibits no tenderness.  Abdominal: Soft. Bowel sounds are  normal. She exhibits no distension and no mass. There is no hepatosplenomegaly. There is tenderness (diffuse, moderate). There is no guarding.  Musculoskeletal: Normal range of motion.  Neurological: She is alert and oriented to person, place, and time. She exhibits normal muscle tone.  Skin: Skin is warm and dry.  Psychiatric: She has a normal mood and affect. Her behavior is normal. Judgment and thought content normal.    ED Course  Procedures (including critical care time) Medications  0.9 %  sodium chloride infusion ( Intravenous New Bag/Given 07/05/13 2156)  sodium chloride 0.9 % bolus 1,000 mL (0 mLs Intravenous Stopped 07/05/13 2156)  HYDROmorphone (DILAUDID) injection 1 mg (1 mg Intravenous Given 07/05/13 2127)  ondansetron (ZOFRAN) injection 4 mg (4 mg Intravenous Given  07/05/13 2127)    DIAGNOSTIC STUDIES: Oxygen Saturation is 100% on RA, normal by my interpretation.    COORDINATION OF CARE: 9:03 PM- Discussed treatment plan with pt which includes pain meds and blood work. Pt agrees to plan.  Stool examination, by nursing reveals brown stool. Stool Hemoccult is positive.  11:53 PM Reevaluation with update and discussion. After initial assessment and treatment, an updated evaluation reveals she is more comfortable now. Gaylan Fauver L   Patient Vitals for the past 24 hrs:  BP Temp Temp src Pulse Resp SpO2 Height Weight  07/05/13 1928 130/53 mmHg 98.3 F (36.8 C) Oral 89 20 100 % 5\' 4"  (1.626 m) 350 lb (158.759 kg)     Labs Review Labs Reviewed  CBC WITH DIFFERENTIAL - Abnormal; Notable for the following:    WBC 12.2 (*)    All other components within normal limits  COMPREHENSIVE METABOLIC PANEL - Abnormal; Notable for the following:    Glucose, Bld 105 (*)    All other components within normal limits  URINALYSIS, ROUTINE W REFLEX MICROSCOPIC - Abnormal; Notable for the following:    Specific Gravity, Urine >1.030 (*)    Hgb urine dipstick TRACE (*)    Ketones, ur 15 (*)    All other components within normal limits  URINE RAPID DRUG SCREEN (HOSP PERFORMED) - Abnormal; Notable for the following:    Tetrahydrocannabinol POSITIVE (*)    All other components within normal limits  URINE MICROSCOPIC-ADD ON - Abnormal; Notable for the following:    Squamous Epithelial / LPF FEW (*)    Bacteria, UA FEW (*)    All other components within normal limits  LIPASE, BLOOD  ETHANOL   Imaging Review Ct Abdomen Pelvis W Contrast  07/05/2013   CLINICAL DATA:  Abdominal pain and elevated white count  EXAM: CT ABDOMEN AND PELVIS WITH CONTRAST  TECHNIQUE: Multidetector CT imaging of the abdomen and pelvis was performed using the standard protocol following bolus administration of intravenous contrast.  CONTRAST:  50mL OMNIPAQUE IOHEXOL 300 MG/ML SOLN,  OMNIPAQUE IOHEXOL 300 MG/ML SOLN  COMPARISON:  09/10/2008  FINDINGS: BODY WALL: Unremarkable.  LOWER CHEST: Unremarkable.  ABDOMEN/PELVIS:  Liver: Possible mild fatty infiltration.  Biliary: Cholecystectomy.  Pancreas: Unremarkable.  Spleen: Unremarkable.  Adrenals: Unremarkable.  Kidneys and ureters: No hydronephrosis or stone.  Bladder: Unremarkable.  Reproductive: Unremarkable.  Bowel: No obstruction. Normal appendix.  Retroperitoneum: No mass or adenopathy.  Peritoneum: No free fluid or gas.  Vascular: Early aortoiliac atherosclerosis, notable did a age.  OSSEOUS: No acute abnormalities.  IMPRESSION: No evidence of acute intra-abdominal disease.   Electronically Signed   By: Tiburcio Pea M.D.   On: 07/05/2013 02:52    EKG Interpretation   None  MDM   1. Abdominal pain   2. Rectal bleeding   3. Hemorrhoids      Nonspecific abdominal pain. Patient did not use the prescribed pain medication, given to her yesterday. Reevaluation, today, is reassuring. Doubt acute, serious intra-abdominal process. Rectal bleeding, mild, with anal hemorrhoids, and brown stool in the rectal vault. She is stable for discharge.  Nursing Notes Reviewed/ Care Coordinated, and agree without changes. Applicable Imaging Reviewed.  Interpretation of Laboratory Data incorporated into ED treatment   Plan: Home Medications- Percocet prepack to go; Home Treatments and Observation- rest, fluids; return here if the recommended treatment, does not improve the symptoms; Recommended follow up- PCP of choice for check up in 1 week.     I personally performed the services described in this documentation, which was scribed in my presence. The recorded information has been reviewed and is accurate.      Sandra Melter, MD 07/05/13 [phone]

## 2013-07-06 LAB — OCCULT BLOOD, POC DEVICE: Fecal Occult Bld: POSITIVE — AB

## 2013-07-08 ENCOUNTER — Encounter (HOSPITAL_COMMUNITY): Payer: Self-pay | Admitting: Emergency Medicine

## 2013-07-08 ENCOUNTER — Emergency Department (HOSPITAL_COMMUNITY): Payer: Self-pay

## 2013-07-08 ENCOUNTER — Emergency Department (HOSPITAL_COMMUNITY)
Admission: EM | Admit: 2013-07-08 | Discharge: 2013-07-08 | Disposition: A | Discharge status: Home / Self Care | Payer: Self-pay | Attending: Emergency Medicine | Admitting: Emergency Medicine

## 2013-07-08 DIAGNOSIS — M129 Arthropathy, unspecified: Secondary | ICD-10-CM | POA: Insufficient documentation

## 2013-07-08 DIAGNOSIS — Z8659 Personal history of other mental and behavioral disorders: Secondary | ICD-10-CM | POA: Insufficient documentation

## 2013-07-08 DIAGNOSIS — Z880 Allergy status to penicillin: Secondary | ICD-10-CM | POA: Insufficient documentation

## 2013-07-08 DIAGNOSIS — K5900 Constipation, unspecified: Secondary | ICD-10-CM | POA: Insufficient documentation

## 2013-07-08 DIAGNOSIS — F17200 Nicotine dependence, unspecified, uncomplicated: Secondary | ICD-10-CM | POA: Insufficient documentation

## 2013-07-08 DIAGNOSIS — R109 Unspecified abdominal pain: Secondary | ICD-10-CM | POA: Insufficient documentation

## 2013-07-08 DIAGNOSIS — E669 Obesity, unspecified: Secondary | ICD-10-CM | POA: Insufficient documentation

## 2013-07-08 DIAGNOSIS — Z9089 Acquired absence of other organs: Secondary | ICD-10-CM | POA: Insufficient documentation

## 2013-07-08 LAB — COMPREHENSIVE METABOLIC PANEL
ALT: 18 U/L (ref 0–35)
AST: 27 U/L (ref 0–37)
BUN: 7 mg/dL (ref 6–23)
Calcium: 9.5 mg/dL (ref 8.4–10.5)
Chloride: 101 mEq/L (ref 96–112)
Sodium: 138 mEq/L (ref 135–145)
Total Bilirubin: 0.3 mg/dL (ref 0.3–1.2)
Total Protein: 7.8 g/dL (ref 6.0–8.3)

## 2013-07-08 LAB — CBC WITH DIFFERENTIAL/PLATELET
Basophils Absolute: 0 10*3/uL (ref 0.0–0.1)
Eosinophils Relative: 2 % (ref 0–5)
HCT: 39.8 % (ref 36.0–46.0)
Hemoglobin: 13.5 g/dL (ref 12.0–15.0)
Lymphocytes Relative: 30 % (ref 12–46)
Lymphs Abs: 3.5 10*3/uL (ref 0.7–4.0)
MCH: 30.3 pg (ref 26.0–34.0)
MCHC: 33.9 g/dL (ref 30.0–36.0)
Monocytes Relative: 5 % (ref 3–12)
Neutrophils Relative %: 62 % (ref 43–77)
Platelets: 256 10*3/uL (ref 150–400)
RDW: 15.4 % (ref 11.5–15.5)

## 2013-07-08 LAB — LIPASE, BLOOD: Lipase: 19 U/L (ref 11–59)

## 2013-07-08 MED ORDER — SODIUM CHLORIDE 0.9 % IV BOLUS (SEPSIS)
1000.0000 mL | Freq: Once | INTRAVENOUS | Status: AC
  Administered 2013-07-08: 1000 mL via INTRAVENOUS

## 2013-07-08 MED ORDER — MORPHINE SULFATE 4 MG/ML IJ SOLN
4.0000 mg | Freq: Once | INTRAMUSCULAR | Status: AC
  Administered 2013-07-08: 4 mg via INTRAVENOUS
  Filled 2013-07-08: qty 1

## 2013-07-08 MED ORDER — OXYCODONE-ACETAMINOPHEN 5-325 MG PO TABS: 2.0000 | ORAL_TABLET | ORAL | Status: DC | PRN

## 2013-07-08 MED ORDER — HYDROMORPHONE HCL PF 1 MG/ML IJ SOLN
0.5000 mg | Freq: Once | INTRAMUSCULAR | Status: AC
  Administered 2013-07-08: 0.5 mg via INTRAVENOUS
  Filled 2013-07-08: qty 1

## 2013-07-08 MED ORDER — ONDANSETRON HCL 4 MG/2ML IJ SOLN
4.0000 mg | Freq: Once | INTRAMUSCULAR | Status: AC
  Administered 2013-07-08: 4 mg via INTRAVENOUS
  Filled 2013-07-08: qty 2

## 2013-07-08 NOTE — ED Notes (Signed)
Pt states that she has constant abdominal pain x 8-9 days. Pt states she was seen at [facility] and was given a laxative. Pt states had bowel movement last night without seeing blood. Pt states she continues to have pain in abdomen that radiates to back. Pt states pain decrease when she lifts her breasts. Pt states pain is not associated with bowel movements. NAD noted at this time. Skin warm and dry.

## 2013-07-08 NOTE — Discharge Instructions (Signed)
Abdominal Pain Many things can cause belly (abdominal) pain. Most times, the belly pain is not dangerous. The amount of belly pain does not tell how serious the problem may be. Many cases of belly pain can be watched and treated at home. HOME CARE   Do not take medicines that help you go poop (laxatives) unless told to by your doctor.  Only take medicine as told by your doctor.  Eat or drink as told by your doctor. Your doctor will tell you if you should be on a special diet. GET HELP RIGHT AWAY IF:   The pain does not go away.  You have a fever.  You keep throwing up (vomiting).  The pain changes and is only in the right or left part of the belly.  You have bloody or tarry looking poop. MAKE SURE YOU:   Understand these instructions.  Will watch your condition.  Will get help right away if you are not doing well or get worse. Document Released: 01/20/2008 Document Revised: 10/26/2011 Document Reviewed: 08/19/2009 [facility] Patient Information 2014 [place], [place].   Take your pain medication. Labs and x-rays were normal.   Try to get primary care followup

## 2013-07-08 NOTE — ED Provider Notes (Signed)
CSN: [id number]     Arrival date & time 07/08/13  5284 History   First MD Initiated Contact with Patient 07/08/13 [phone]     Chief Complaint  Patient presents with  . Abdominal Pain  . Constipation   (Consider location/radiation/quality/duration/timing/severity/associated sxs/prior Treatment) HPI.... intermittent sharp left-sided abdominal pain for several days, left upper quadrant worse than left lower quadrant.  Pain radiates to the back.   Third emergency department visit for same. CT scan of abdomen /pelvis on 07/04/13 normal.  Labs and urinalysis have been normal.  Patient is able to eat. Last bowel movement yesterday. Status post cholecystectomy. No fever, chills, dysuria, vomiting, chest pain, dyspnea, black stool Past Medical History  Diagnosis Date  . Depression   . Arthritis   . Obesity    Past Surgical History  Procedure Laterality Date  . Cholecystectomy    . Tubal ligation     Family History  Problem Relation Age of Onset  . Depression Mother     bipolar/schizo  . Heart disease Mother   . Diabetes Mother   . Asthma Father   . Hyperlipidemia Father   . Hypertension Father   . Depression Maternal Grandmother   . Heart disease Maternal Grandmother   . Diabetes Maternal Grandmother   . Hypertension Maternal Grandfather    History  Substance Use Topics  . Smoking status: Current Every Day Smoker -- 1.00 packs/day  . Smokeless tobacco: Not on file  . Alcohol Use: No   OB History   Grav Para Term Preterm Abortions TAB SAB Ect Mult Living                 Review of Systems  All other systems reviewed and are negative.    Allergies  Penicillins  Home Medications   Current Outpatient Rx  Name  Route  Sig  Dispense  Refill  . trolamine salicylate (ASPERCREME) 10 % cream   Topical   Apply 1 application topically as needed (For pain.).         Marland Kitchen HYDROcodone-acetaminophen (NORCO) 5-325 MG per tablet   Oral   Take 2 tablets by mouth every 4 (four) hours  as needed.   10 tablet   0   . oxyCODONE-acetaminophen (PERCOCET/ROXICET) 5-325 MG per tablet   Oral   Take 1 tablet by mouth every 4 (four) hours as needed.   6 tablet   0   . oxyCODONE-acetaminophen (PERCOCET/ROXICET) 5-325 MG per tablet   Oral   Take 2 tablets by mouth every 4 (four) hours as needed for severe pain.   6 tablet   0    BP 152/94  Pulse 65  Temp(Src) 97.3 F (36.3 C) (Oral)  Resp 18  Ht 5\' 4"  (1.626 m)  Wt 350 lb (158.759 kg)  BMI 60.05 kg/m2  SpO2 100%  LMP 06/02/2013 Physical Exam  Nursing note and vitals reviewed. Constitutional: She is oriented to person, place, and time.  obese  HENT:  Head: Normocephalic and atraumatic.  Eyes: Conjunctivae and EOM are normal. Pupils are equal, round, and reactive to light.  Neck: Normal range of motion. Neck supple.  Cardiovascular: Normal rate, regular rhythm and normal heart sounds.   Pulmonary/Chest: Effort normal and breath sounds normal.  Abdominal: Soft. Bowel sounds are normal.  nontender  Genitourinary:  Normal genitalia  Musculoskeletal: Normal range of motion.  Neurological: She is alert and oriented to person, place, and time.  Skin: Skin is warm and dry.  Pink color  Psychiatric:  She has a normal mood and affect. Her behavior is normal.    ED Course  Procedures (including critical care time) Labs Review Labs Reviewed  CBC WITH DIFFERENTIAL - Abnormal; Notable for the following:    WBC 11.6 (*)    All other components within normal limits  COMPREHENSIVE METABOLIC PANEL  LIPASE, BLOOD   Imaging Review Dg Abd Acute W/chest  07/08/2013   CLINICAL DATA:  Left-sided abdominal pain for 4 days  EXAM: ACUTE ABDOMEN SERIES (ABDOMEN 2 VIEW & CHEST 1 VIEW)  COMPARISON:  None.  FINDINGS: Previous cholecystectomy. Air-filled loops of small bowel within the right lower quadrant measure up to 2.1 cm. Tere is no evidence of dilated bowel loops or free intraperitoneal air. No radiopaque calculi or other  significant radiographic abnormality is seen. There is mild cardiac enlargement. Both lungs are clear.  IMPRESSION: Nonobstructive bowel gas pattern.   Electronically Signed   By: Signa Kell M.D.   On: 07/08/2013 10:23    EKG Interpretation   None       MDM   1. Abdominal pain    Patient is in no acute distress. No acute abdomen. All x-rays and labs reviewed from previous visits. No red flags identified. Screening labs, abdominal series negative.  Discharge medications Percocet   Donnetta Hutching, MD 07/08/13 1354

## 2013-07-09 ENCOUNTER — Encounter (HOSPITAL_COMMUNITY): Payer: Self-pay | Admitting: Emergency Medicine

## 2013-07-09 DIAGNOSIS — K2970 Gastritis, unspecified, without bleeding: Secondary | ICD-10-CM | POA: Insufficient documentation

## 2013-07-09 DIAGNOSIS — F17200 Nicotine dependence, unspecified, uncomplicated: Secondary | ICD-10-CM | POA: Insufficient documentation

## 2013-07-09 DIAGNOSIS — Z9089 Acquired absence of other organs: Secondary | ICD-10-CM | POA: Insufficient documentation

## 2013-07-09 DIAGNOSIS — R5381 Other malaise: Secondary | ICD-10-CM | POA: Insufficient documentation

## 2013-07-09 DIAGNOSIS — Z880 Allergy status to penicillin: Secondary | ICD-10-CM | POA: Insufficient documentation

## 2013-07-09 DIAGNOSIS — Z8659 Personal history of other mental and behavioral disorders: Secondary | ICD-10-CM | POA: Insufficient documentation

## 2013-07-09 DIAGNOSIS — M129 Arthropathy, unspecified: Secondary | ICD-10-CM | POA: Insufficient documentation

## 2013-07-09 DIAGNOSIS — Z79899 Other long term (current) drug therapy: Secondary | ICD-10-CM | POA: Insufficient documentation

## 2013-07-09 DIAGNOSIS — Z3202 Encounter for pregnancy test, result negative: Secondary | ICD-10-CM | POA: Insufficient documentation

## 2013-07-09 DIAGNOSIS — M546 Pain in thoracic spine: Secondary | ICD-10-CM | POA: Insufficient documentation

## 2013-07-09 DIAGNOSIS — E669 Obesity, unspecified: Secondary | ICD-10-CM | POA: Insufficient documentation

## 2013-07-09 MED ORDER — ONDANSETRON 4 MG PO TBDP
8.0000 mg | ORAL_TABLET | Freq: Once | ORAL | Status: AC
  Administered 2013-07-10: 8 mg via ORAL
  Filled 2013-07-09: qty 2

## 2013-07-09 NOTE — ED Notes (Addendum)
Presents with nausea, vomiting  and generalized abdominal pain for over one week. Also c/o back pain. 4th ED visit for this same problem. Nothing is making nausea and vomiting better. C/o body aches and cough. Denies diarrhea, unable to have BM without a laxative.  Denies sob. Reports abdominal tenderness to touch. Family is frustrated about not getting results and pt continuously being sent home. Denies dizziness and lightheadedness.  Abdominal pain begins in lower part of stomach and goes up into epigastric area, described as sharp and intermittent.

## 2013-07-09 NOTE — ED Notes (Signed)
To ED from home via EMS, c/o abd pain, N/V X1w (4th ED visit this week), being treated for constipation, VSS, NAD

## 2013-07-10 ENCOUNTER — Emergency Department (HOSPITAL_COMMUNITY)
Admission: EM | Admit: 2013-07-10 | Discharge: 2013-07-10 | Disposition: A | Discharge status: Home / Self Care | Payer: Self-pay | Attending: Emergency Medicine | Admitting: Emergency Medicine

## 2013-07-10 DIAGNOSIS — R109 Unspecified abdominal pain: Secondary | ICD-10-CM

## 2013-07-10 DIAGNOSIS — K2970 Gastritis, unspecified, without bleeding: Secondary | ICD-10-CM

## 2013-07-10 DIAGNOSIS — R112 Nausea with vomiting, unspecified: Secondary | ICD-10-CM

## 2013-07-10 LAB — CBC WITH DIFFERENTIAL/PLATELET
Basophils Absolute: 0.1 10*3/uL (ref 0.0–0.1)
Basophils Relative: 1 % (ref 0–1)
Eosinophils Relative: 0 % (ref 0–5)
HCT: 38.5 % (ref 36.0–46.0)
Hemoglobin: 13.1 g/dL (ref 12.0–15.0)
Lymphocytes Relative: 17 % (ref 12–46)
Lymphs Abs: 2.1 10*3/uL (ref 0.7–4.0)
MCHC: 34 g/dL (ref 30.0–36.0)
Monocytes Absolute: 0.6 10*3/uL (ref 0.1–1.0)
Monocytes Relative: 5 % (ref 3–12)
Neutro Abs: 9.3 10*3/uL — ABNORMAL HIGH (ref 1.7–7.7)
Neutrophils Relative %: 77 % (ref 43–77)
RDW: 15.5 % (ref 11.5–15.5)
WBC: 12.2 10*3/uL — ABNORMAL HIGH (ref 4.0–10.5)

## 2013-07-10 LAB — URINALYSIS, ROUTINE W REFLEX MICROSCOPIC
Hgb urine dipstick: NEGATIVE
Leukocytes, UA: NEGATIVE
Nitrite: NEGATIVE
Specific Gravity, Urine: 1.028 (ref 1.005–1.030)
Urobilinogen, UA: 0.2 mg/dL (ref 0.0–1.0)

## 2013-07-10 LAB — COMPREHENSIVE METABOLIC PANEL
ALT: 19 U/L (ref 0–35)
AST: 22 U/L (ref 0–37)
Albumin: 4.1 g/dL (ref 3.5–5.2)
Alkaline Phosphatase: 83 U/L (ref 39–117)
CO2: 26 mEq/L (ref 19–32)
Calcium: 9.6 mg/dL (ref 8.4–10.5)
Chloride: 102 mEq/L (ref 96–112)
GFR calc non Af Amer: >90 mL/min (ref >90)
Glucose, Bld: 117 mg/dL — ABNORMAL HIGH (ref 70–99)
Potassium: 3.7 mEq/L (ref 3.5–5.1)
Total Bilirubin: 0.2 mg/dL — ABNORMAL LOW (ref 0.3–1.2)

## 2013-07-10 LAB — URINE MICROSCOPIC-ADD ON

## 2013-07-10 LAB — POCT PREGNANCY, URINE: Preg Test, Ur: NEGATIVE

## 2013-07-10 MED ORDER — FAMOTIDINE 20 MG PO TABS: 20.0000 mg | ORAL_TABLET | Freq: Two times a day (BID) | ORAL | Status: DC

## 2013-07-10 MED ORDER — ONDANSETRON HCL 4 MG/2ML IJ SOLN
4.0000 mg | Freq: Once | INTRAMUSCULAR | Status: AC
  Administered 2013-07-10: 4 mg via INTRAVENOUS
  Filled 2013-07-10: qty 2

## 2013-07-10 MED ORDER — PROMETHAZINE HCL 25 MG PO TABS: 25.0000 mg | ORAL_TABLET | Freq: Four times a day (QID) | ORAL | Status: DC | PRN

## 2013-07-10 MED ORDER — HYDROMORPHONE HCL PF 1 MG/ML IJ SOLN
1.0000 mg | Freq: Once | INTRAMUSCULAR | Status: AC
  Administered 2013-07-10: 1 mg via INTRAVENOUS
  Filled 2013-07-10: qty 1

## 2013-07-10 MED ORDER — FAMOTIDINE IN NACL 20-0.9 MG/50ML-% IV SOLN
20.0000 mg | Freq: Once | INTRAVENOUS | Status: AC
  Administered 2013-07-10: 20 mg via INTRAVENOUS
  Filled 2013-07-10: qty 50

## 2013-07-10 MED FILL — Oxycodone w/ Acetaminophen Tab 5-325 MG: ORAL | Qty: 6 | Status: AC

## 2013-07-10 NOTE — ED Notes (Signed)
Patient with 5 day history of abdominal pain, caused by her back pain.  Patient states she did have some nausea, it was relieved with ODT Zofran.

## 2013-07-10 NOTE — ED Provider Notes (Signed)
CSN: [id number]     Arrival date & time 07/09/13  2231 History   First MD Initiated Contact with Patient 07/10/13 0300     Chief Complaint  Patient presents with  . Abdominal Pain   (Consider location/radiation/quality/duration/timing/severity/associated sxs/prior Treatment) HPI Comments: 42 y/o woman s/p cholecystectomy comes in with cc of abd pain. Pt has nausea, vomiting  and generalized abdominal pain for over one week. Also she has thoracic back pain. 4th ED visit for this same problem. CT abdomen with contrast was neg. Pt has had about 3 episodes of emesis in the past 24 hours - bilious. She has no diarrhea. Appetite is reduced. No uti like sx, no hx of renal stones.  Patient is a 42 y.o. female presenting with abdominal pain. The history is provided by the patient, medical records and a relative.  Abdominal Pain Associated symptoms: fatigue, nausea and vomiting   Associated symptoms: no chest pain, no diarrhea, no dysuria, no fever and no shortness of breath     Past Medical History  Diagnosis Date  . Depression   . Arthritis   . Obesity    Past Surgical History  Procedure Laterality Date  . Cholecystectomy    . Tubal ligation     Family History  Problem Relation Age of Onset  . Depression Mother     bipolar/schizo  . Heart disease Mother   . Diabetes Mother   . Asthma Father   . Hyperlipidemia Father   . Hypertension Father   . Depression Maternal Grandmother   . Heart disease Maternal Grandmother   . Diabetes Maternal Grandmother   . Hypertension Maternal Grandfather    History  Substance Use Topics  . Smoking status: Current Every Day Smoker -- 1.00 packs/day  . Smokeless tobacco: Not on file  . Alcohol Use: No   OB History   Grav Para Term Preterm Abortions TAB SAB Ect Mult Living                 Review of Systems  Constitutional: Positive for activity change, appetite change and fatigue. Negative for fever.  Respiratory: Negative for shortness of  breath.   Cardiovascular: Negative for chest pain.  Gastrointestinal: Positive for nausea, vomiting and abdominal pain. Negative for diarrhea.  Genitourinary: Negative for dysuria.  Musculoskeletal: Negative for neck pain.  Neurological: Negative for headaches.    Allergies  Penicillins  Home Medications   Current Outpatient Rx  Name  Route  Sig  Dispense  Refill  . famotidine (PEPCID) 20 MG tablet   Oral   Take 1 tablet (20 mg total) by mouth 2 (two) times daily.   30 tablet   0   . HYDROcodone-acetaminophen (NORCO) 5-325 MG per tablet   Oral   Take 2 tablets by mouth every 4 (four) hours as needed.   10 tablet   0   . EXPIRED: promethazine (PHENERGAN) 25 MG tablet   Oral   Take 1 tablet (25 mg total) by mouth every 6 (six) hours as needed for nausea.   15 tablet   0   . promethazine (PHENERGAN) 25 MG tablet   Oral   Take 1 tablet (25 mg total) by mouth every 6 (six) hours as needed for nausea.   30 tablet   0    BP 97/59  Pulse 58  Temp(Src) 97.7 F (36.5 C) (Oral)  Resp 21  SpO2 95%  LMP 06/02/2013 Physical Exam  Nursing note and vitals reviewed. Constitutional: She is oriented  to person, place, and time. She appears well-developed and well-nourished.  Morbidly obese  HENT:  Head: Normocephalic and atraumatic.  Eyes: EOM are normal. Pupils are equal, round, and reactive to light.  Neck: Neck supple.  Cardiovascular: Normal rate, regular rhythm and normal heart sounds.   No murmur heard. Pulmonary/Chest: Effort normal. No respiratory distress.  Abdominal: Soft. She exhibits no distension. There is tenderness. There is no rebound and no guarding.  Epigastrium, LUQ  Musculoskeletal:  Pt has NO tenderness over the spine No step offs, no erythema. No tenderness over the flank region.  Neurological: She is alert and oriented to person, place, and time.  Skin: Skin is warm and dry.    ED Course  Procedures (including critical care time) Labs  Review Labs Reviewed  CBC WITH DIFFERENTIAL - Abnormal; Notable for the following:    WBC 12.2 (*)    Neutro Abs 9.3 (*)    All other components within normal limits  COMPREHENSIVE METABOLIC PANEL - Abnormal; Notable for the following:    Glucose, Bld 117 (*)    BUN 5 (*)    Total Bilirubin 0.2 (*)    All other components within normal limits  URINALYSIS, ROUTINE W REFLEX MICROSCOPIC - Abnormal; Notable for the following:    Color, Urine AMBER (*)    APPearance TURBID (*)    Bilirubin Urine SMALL (*)    Ketones, ur 40 (*)    Protein, ur 30 (*)    All other components within normal limits  URINE MICROSCOPIC-ADD ON - Abnormal; Notable for the following:    Squamous Epithelial / LPF FEW (*)    Bacteria, UA MANY (*)    All other components within normal limits  POCT PREGNANCY, URINE   Imaging Review Dg Abd Acute W/chest  07/08/2013   CLINICAL DATA:  Left-sided abdominal pain for 4 days  EXAM: ACUTE ABDOMEN SERIES (ABDOMEN 2 VIEW & CHEST 1 VIEW)  COMPARISON:  None.  FINDINGS: Previous cholecystectomy. Air-filled loops of small bowel within the right lower quadrant measure up to 2.1 cm. Tere is no evidence of dilated bowel loops or free intraperitoneal air. No radiopaque calculi or other significant radiographic abnormality is seen. There is mild cardiac enlargement. Both lungs are clear.  IMPRESSION: Nonobstructive bowel gas pattern.   Electronically Signed   By: Signa Kell M.D.   On: 07/08/2013 10:23    EKG Interpretation    Date/Time:  Sunday July 09 2013 23:45:24 EST Ventricular Rate:  70 PR Interval:  140 QRS Duration: 78 QT Interval:  404 QTC Calculation: 436 R Axis:   84 Text Interpretation:  Normal sinus rhythm Low voltage QRS Borderline ECG Confirmed by OTTER  MD, OLGA ([id number]) on 07/09/2013 11:48:40 PM            MDM   1. Abdominal pain   2. Nausea and vomiting   3. Gastritis    Pt comes in with cc of abd pain, back pain and n/v. EKG is normal. She  has no cardiac risk factors. Pt is having a BM, no peritoneal signs on exam, she had a CT abd that was negative for this same complains, her vitals have 0 sirs criteria.  Suspect gastritis, enteritis, gastroparesis etc.  I explained to the patient and family, that we cannot diagnose every condition in the ED, and that someone like her will benefit from close PCP follow up - and since she doesn't have one - i have decided to give her GI follow  up.  Derwood Kaplan, MD 07/10/13 0730

## 2013-07-12 ENCOUNTER — Emergency Department (HOSPITAL_COMMUNITY)
Admission: EM | Admit: 2013-07-12 | Discharge: 2013-07-12 | Disposition: A | Discharge status: Home / Self Care | Payer: Self-pay | Attending: Emergency Medicine | Admitting: Emergency Medicine

## 2013-07-12 ENCOUNTER — Encounter (HOSPITAL_COMMUNITY): Payer: Self-pay | Admitting: Emergency Medicine

## 2013-07-12 DIAGNOSIS — F17200 Nicotine dependence, unspecified, uncomplicated: Secondary | ICD-10-CM | POA: Insufficient documentation

## 2013-07-12 DIAGNOSIS — R112 Nausea with vomiting, unspecified: Secondary | ICD-10-CM | POA: Insufficient documentation

## 2013-07-12 DIAGNOSIS — M129 Arthropathy, unspecified: Secondary | ICD-10-CM | POA: Insufficient documentation

## 2013-07-12 DIAGNOSIS — Z79899 Other long term (current) drug therapy: Secondary | ICD-10-CM | POA: Insufficient documentation

## 2013-07-12 DIAGNOSIS — Z880 Allergy status to penicillin: Secondary | ICD-10-CM | POA: Insufficient documentation

## 2013-07-12 DIAGNOSIS — R109 Unspecified abdominal pain: Secondary | ICD-10-CM

## 2013-07-12 DIAGNOSIS — E669 Obesity, unspecified: Secondary | ICD-10-CM | POA: Insufficient documentation

## 2013-07-12 DIAGNOSIS — Z8659 Personal history of other mental and behavioral disorders: Secondary | ICD-10-CM | POA: Insufficient documentation

## 2013-07-12 DIAGNOSIS — R1012 Left upper quadrant pain: Secondary | ICD-10-CM | POA: Insufficient documentation

## 2013-07-12 DIAGNOSIS — Z9089 Acquired absence of other organs: Secondary | ICD-10-CM | POA: Insufficient documentation

## 2013-07-12 LAB — COMPREHENSIVE METABOLIC PANEL
ALT: 16 U/L (ref 0–35)
AST: 17 U/L (ref 0–37)
Albumin: 4 g/dL (ref 3.5–5.2)
Alkaline Phosphatase: 77 U/L (ref 39–117)
Calcium: 9.3 mg/dL (ref 8.4–10.5)
GFR calc Af Amer: >90 mL/min (ref >90)
Potassium: 3.3 mEq/L — ABNORMAL LOW (ref 3.5–5.1)
Sodium: 140 mEq/L (ref 135–145)
Total Protein: 7.5 g/dL (ref 6.0–8.3)

## 2013-07-12 LAB — CBC WITH DIFFERENTIAL/PLATELET
Basophils Absolute: 0 10*3/uL (ref 0.0–0.1)
Basophils Relative: 0 % (ref 0–1)
Eosinophils Absolute: 0.1 10*3/uL (ref 0.0–0.7)
Eosinophils Relative: 1 % (ref 0–5)
MCH: 30.7 pg (ref 26.0–34.0)
MCHC: 34.2 g/dL (ref 30.0–36.0)
MCV: 89.7 fL (ref 78.0–100.0)
Monocytes Relative: 6 % (ref 3–12)
Neutrophils Relative %: 69 % (ref 43–77)
Platelets: 250 10*3/uL (ref 150–400)
RBC: 4.37 MIL/uL (ref 3.87–5.11)
RDW: 15.2 % (ref 11.5–15.5)

## 2013-07-12 MED ORDER — MORPHINE SULFATE 4 MG/ML IJ SOLN
4.0000 mg | Freq: Once | INTRAMUSCULAR | Status: AC
  Administered 2013-07-12: 4 mg via INTRAVENOUS
  Filled 2013-07-12: qty 1

## 2013-07-12 MED ORDER — PROMETHAZINE HCL 25 MG PO TABS: 25.0000 mg | ORAL_TABLET | Freq: Four times a day (QID) | ORAL | Status: DC | PRN

## 2013-07-12 MED ORDER — OMEPRAZOLE 20 MG PO CPDR: 20.0000 mg | DELAYED_RELEASE_CAPSULE | Freq: Every day | ORAL | Status: DC

## 2013-07-12 MED ORDER — OXYCODONE-ACETAMINOPHEN 5-325 MG PO TABS: 1.0000 | ORAL_TABLET | Freq: Four times a day (QID) | ORAL | Status: DC | PRN

## 2013-07-12 MED ORDER — ONDANSETRON HCL 4 MG/2ML IJ SOLN
4.0000 mg | Freq: Once | INTRAMUSCULAR | Status: AC
  Administered 2013-07-12: 4 mg via INTRAVENOUS
  Filled 2013-07-12: qty 2

## 2013-07-12 MED ORDER — PANTOPRAZOLE SODIUM 40 MG IV SOLR
40.0000 mg | Freq: Once | INTRAVENOUS | Status: AC
  Administered 2013-07-12: 40 mg via INTRAVENOUS
  Filled 2013-07-12: qty 40

## 2013-07-12 NOTE — ED Provider Notes (Signed)
Medical screening examination/treatment/procedure(s) were performed by non-physician practitioner and as supervising physician I was immediately available for consultation/collaboration.  EKG Interpretation   None         Sandra Orr Sandra Nyliah Nierenberg, DO 07/12/13 1702 

## 2013-07-12 NOTE — ED Notes (Signed)
Pt. Having lt. Upper abdominal pain for over 5 days,  She is having loose stool and reports it being black.  Also vomiting black.  Denies any urinary problems denies any vaginal problems.

## 2013-07-12 NOTE — ED Provider Notes (Signed)
CSN: [id number]     Arrival date & time 07/12/13  7829 History   First MD Initiated Contact with Patient 07/12/13 [phone]     Chief Complaint  Patient presents with  . Abdominal Pain   (Consider location/radiation/quality/duration/timing/severity/associated sxs/prior Treatment) HPI Comments: Patient presents today with a chief complaint of LUQ abdominal pain.  Pain radiates to her back.  She states that the pain has been present for the past 5 days and is intermittent.  She reports that the pain is not associated with eating.  This is her fifth ED visit in the past 8 days for the same.  She had a CT ab/pelvis done on 07/05/13, which was negative.  She has been taking Hydrocodone for the pain, which she reports has helped.  However, she states that the pain medication makes her more nauseous.  She has taken Zofran for nausea, which she reports has helped some. She reports that she has not been vomiting, but has been gagging.  She reports that she had a Cholecystectomy performed approximately 25-30 year ago.  She reports that she is also having nausea and vomiting.  She denies diarrhea.  She reports that she noticed some black specks in her emesis yesterday, which she was concerned was blood.  She reports that her emesis since that time has been bile and that she has not noticed any further black color or blood.  She also reports that her stool has appeared black in color.  She denies diarrhea.  She states that she has not followed up with GI or a PCP as she has previously been instructed to do at past ED visits.  She states that she does not have a PCP or insurance.  The history is provided by the patient.    Past Medical History  Diagnosis Date  . Depression   . Arthritis   . Obesity    Past Surgical History  Procedure Laterality Date  . Cholecystectomy    . Tubal ligation     Family History  Problem Relation Age of Onset  . Depression Mother     bipolar/schizo  . Heart disease Mother   .  Diabetes Mother   . Asthma Father   . Hyperlipidemia Father   . Hypertension Father   . Depression Maternal Grandmother   . Heart disease Maternal Grandmother   . Diabetes Maternal Grandmother   . Hypertension Maternal Grandfather    History  Substance Use Topics  . Smoking status: Current Every Day Smoker -- 1.00 packs/day  . Smokeless tobacco: Not on file  . Alcohol Use: No   OB History   Grav Para Term Preterm Abortions TAB SAB Ect Mult Living                 Review of Systems  Gastrointestinal: Positive for nausea, vomiting and abdominal pain.  All other systems reviewed and are negative.    Allergies  Penicillins  Home Medications   Current Outpatient Rx  Name  Route  Sig  Dispense  Refill  . famotidine (PEPCID) 20 MG tablet   Oral   Take 1 tablet (20 mg total) by mouth 2 (two) times daily.   30 tablet   0   . HYDROcodone-acetaminophen (NORCO) 5-325 MG per tablet   Oral   Take 2 tablets by mouth every 4 (four) hours as needed.   10 tablet   0   . EXPIRED: promethazine (PHENERGAN) 25 MG tablet   Oral   Take 1 tablet (  25 mg total) by mouth every 6 (six) hours as needed for nausea.   15 tablet   0   . promethazine (PHENERGAN) 25 MG tablet   Oral   Take 1 tablet (25 mg total) by mouth every 6 (six) hours as needed for nausea.   30 tablet   0    BP 141/102  Pulse 80  Temp(Src) 98.6 F (37 C) (Oral)  Resp 18  SpO2 100%  LMP 06/02/2013 Physical Exam  Nursing note and vitals reviewed. Constitutional: She appears well-developed and well-nourished. No distress.  HENT:  Head: Normocephalic and atraumatic.  Mouth/Throat: Oropharynx is clear and moist.  Neck: Normal range of motion. Neck supple.  Cardiovascular: Normal rate, regular rhythm and normal heart sounds.   Pulmonary/Chest: Effort normal and breath sounds normal. No respiratory distress. She has no wheezes. She has no rales.  Abdominal: Soft. Bowel sounds are normal. She exhibits no  distension and no mass. There is tenderness in the left upper quadrant. There is no rebound, no guarding and negative Murphy's sign.  Genitourinary:  Brown colored stool visualized on rectal exam  Neurological: She is alert.  Skin: Skin is warm and dry. She is not diaphoretic.  Psychiatric: She has a normal mood and affect.    ED Course  Procedures (including critical care time) Labs Review Labs Reviewed  CBC WITH DIFFERENTIAL  COMPREHENSIVE METABOLIC PANEL  LIPASE, BLOOD   Imaging Review No results found.  EKG Interpretation   None       MDM  No diagnosis found. Patient presents with a chief complaint of LUQ abdominal pain.  Patient with several recent ED visits for the same.  Patient had a negative CT of the abdomen/pelvis on 07/05/13, which was negative.  Patient without any peritoneal signs on abdominal exam.  Patient afebrile.  Labs unremarkable.  Pain improved during ED course.  Nausea also improved.  Patient tolerating PO liquids prior to discharge.  Feel that the patient is stable for discharge.  Patient instructed to follow up with GI.  Return precautions given.    Santiago Glad, PA-C 07/12/13 1621

## 2013-07-12 NOTE — ED Notes (Signed)
Pt. oob to the bathroom, gait steady.  

## 2013-07-12 NOTE — ED Notes (Signed)
Pt given grape juice for fluid challenge.

## 2013-07-15 ENCOUNTER — Emergency Department (HOSPITAL_COMMUNITY)
Admission: EM | Admit: 2013-07-15 | Discharge: 2013-07-16 | Disposition: A | Discharge status: Home / Self Care | Payer: Self-pay | Attending: Emergency Medicine | Admitting: Emergency Medicine

## 2013-07-15 ENCOUNTER — Encounter (HOSPITAL_COMMUNITY): Payer: Self-pay | Admitting: Emergency Medicine

## 2013-07-15 DIAGNOSIS — Z9089 Acquired absence of other organs: Secondary | ICD-10-CM | POA: Insufficient documentation

## 2013-07-15 DIAGNOSIS — R630 Anorexia: Secondary | ICD-10-CM | POA: Insufficient documentation

## 2013-07-15 DIAGNOSIS — R109 Unspecified abdominal pain: Secondary | ICD-10-CM

## 2013-07-15 DIAGNOSIS — Z3202 Encounter for pregnancy test, result negative: Secondary | ICD-10-CM | POA: Insufficient documentation

## 2013-07-15 DIAGNOSIS — R112 Nausea with vomiting, unspecified: Secondary | ICD-10-CM | POA: Insufficient documentation

## 2013-07-15 DIAGNOSIS — Z8739 Personal history of other diseases of the musculoskeletal system and connective tissue: Secondary | ICD-10-CM | POA: Insufficient documentation

## 2013-07-15 DIAGNOSIS — Z9851 Tubal ligation status: Secondary | ICD-10-CM | POA: Insufficient documentation

## 2013-07-15 DIAGNOSIS — F17200 Nicotine dependence, unspecified, uncomplicated: Secondary | ICD-10-CM | POA: Insufficient documentation

## 2013-07-15 DIAGNOSIS — Z79899 Other long term (current) drug therapy: Secondary | ICD-10-CM | POA: Insufficient documentation

## 2013-07-15 DIAGNOSIS — R0682 Tachypnea, not elsewhere classified: Secondary | ICD-10-CM | POA: Insufficient documentation

## 2013-07-15 DIAGNOSIS — R6883 Chills (without fever): Secondary | ICD-10-CM | POA: Insufficient documentation

## 2013-07-15 DIAGNOSIS — Z880 Allergy status to penicillin: Secondary | ICD-10-CM | POA: Insufficient documentation

## 2013-07-15 DIAGNOSIS — E669 Obesity, unspecified: Secondary | ICD-10-CM | POA: Insufficient documentation

## 2013-07-15 DIAGNOSIS — Z8659 Personal history of other mental and behavioral disorders: Secondary | ICD-10-CM | POA: Insufficient documentation

## 2013-07-15 LAB — COMPREHENSIVE METABOLIC PANEL WITH GFR
Albumin: 3.8 g/dL (ref 3.5–5.2)
Calcium: 9.4 mg/dL (ref 8.4–10.5)
Creatinine, Ser: 0.67 mg/dL (ref 0.50–1.10)
Total Protein: 7.3 g/dL (ref 6.0–8.3)

## 2013-07-15 LAB — COMPREHENSIVE METABOLIC PANEL
ALT: 13 U/L (ref 0–35)
AST: 15 U/L (ref 0–37)
Alkaline Phosphatase: 77 U/L (ref 39–117)
BUN: 6 mg/dL (ref 6–23)
CO2: 26 mEq/L (ref 19–32)
Chloride: 101 mEq/L (ref 96–112)
GFR calc Af Amer: >90 mL/min (ref >90)
GFR calc non Af Amer: >90 mL/min (ref >90)
Glucose, Bld: 109 mg/dL — ABNORMAL HIGH (ref 70–99)
Potassium: 3.5 mEq/L (ref 3.5–5.1)
Sodium: 136 mEq/L (ref 135–145)
Total Bilirubin: 0.3 mg/dL (ref 0.3–1.2)

## 2013-07-15 LAB — CBC WITH DIFFERENTIAL/PLATELET
Basophils Absolute: 0.1 K/uL (ref 0.0–0.1)
Basophils Relative: 1 % (ref 0–1)
Eosinophils Absolute: 0.4 10*3/uL (ref 0.0–0.7)
Eosinophils Relative: 4 % (ref 0–5)
HCT: 38.2 % (ref 36.0–46.0)
Hemoglobin: 13.1 g/dL (ref 12.0–15.0)
Lymphocytes Relative: 23 % (ref 12–46)
Lymphs Abs: 2.5 10*3/uL (ref 0.7–4.0)
MCH: 30.4 pg (ref 26.0–34.0)
MCHC: 34.3 g/dL (ref 30.0–36.0)
MCV: 88.6 fL (ref 78.0–100.0)
Monocytes Absolute: 0.7 K/uL (ref 0.1–1.0)
Monocytes Relative: 7 % (ref 3–12)
Neutro Abs: 7.3 K/uL (ref 1.7–7.7)
Neutrophils Relative %: 66 % (ref 43–77)
Platelets: 257 10*3/uL (ref 150–400)
RBC: 4.31 MIL/uL (ref 3.87–5.11)
RDW: 15.4 % (ref 11.5–15.5)
WBC: 11 10*3/uL — ABNORMAL HIGH (ref 4.0–10.5)

## 2013-07-15 LAB — LIPASE, BLOOD: Lipase: 22 U/L (ref 11–59)

## 2013-07-15 MED ORDER — SODIUM CHLORIDE 0.9 % IV BOLUS (SEPSIS)
1000.0000 mL | Freq: Once | INTRAVENOUS | Status: AC
  Administered 2013-07-16: 1000 mL via INTRAVENOUS

## 2013-07-15 MED ORDER — PANTOPRAZOLE SODIUM 40 MG IV SOLR
40.0000 mg | Freq: Once | INTRAVENOUS | Status: AC
  Administered 2013-07-16: 40 mg via INTRAVENOUS
  Filled 2013-07-15: qty 40

## 2013-07-15 MED ORDER — HYDROMORPHONE HCL PF 1 MG/ML IJ SOLN
1.0000 mg | Freq: Once | INTRAMUSCULAR | Status: AC
  Administered 2013-07-16: 1 mg via INTRAVENOUS
  Filled 2013-07-15: qty 1

## 2013-07-15 MED ORDER — ONDANSETRON HCL 4 MG/2ML IJ SOLN
4.0000 mg | Freq: Once | INTRAMUSCULAR | Status: AC
  Administered 2013-07-16: 4 mg via INTRAVENOUS
  Filled 2013-07-15: qty 2

## 2013-07-15 MED ORDER — GI COCKTAIL ~~LOC~~
30.0000 mL | Freq: Once | ORAL | Status: AC
  Administered 2013-07-16: 30 mL via ORAL
  Filled 2013-07-15: qty 30

## 2013-07-15 MED ORDER — ONDANSETRON 4 MG PO TBDP: 8.0000 mg | ORAL_TABLET | Freq: Once | ORAL | Status: DC

## 2013-07-15 NOTE — ED Provider Notes (Signed)
CSN: [id number]     Arrival date & time 07/15/13  2128 History   First MD Initiated Contact with Patient 07/15/13 2307     Chief Complaint  Patient presents with  . Abdominal Pain   (Consider location/radiation/quality/duration/timing/severity/associated sxs/prior Treatment) HPI Comments: Pt has been seen 5 times previously for same, had CT scan with contrast on 11/19, plain films on 11/22 with no acute findings.  Pt has called and made an appt with GI but is not until 12/6.  Pt is taking meds with no relief.  Pt has no PCP, admits has no $ or insurance.  Has not tried antacids, unsure if she received any during prior visits.  She reports pain when it comes on, seems worse each time.  She gets partial relief after visiting the ED and receiving treatment, but as medication subsides, symptoms simply come back and seems worse each time.  Pain initially was in "spine" in her middle back and went to her left flank.  Now involves more left upper quadrant and epigastrium.  Some soreness to right side that she attributes to retching.  Vomited 3 times today.  Has been seeing rectal bleeding as well.  Denies alcohol use.  Has had hysterectomy and GB removed in the past due to stones.    Patient is a 42 y.o. female presenting with abdominal pain. The history is provided by the patient.  Abdominal Pain Pain location:  L flank, LUQ and epigastric Pain quality: cramping, sharp, shooting and throbbing   Pain severity:  Severe Onset quality:  Gradual Duration:  2 weeks Timing:  Constant Progression:  Waxing and waning Chronicity:  New Context: previous surgery and retching   Context: not alcohol use and not recent illness   Relieved by:  Nothing Worsened by:  Eating (Sight or smell of food makes nausea worse) Ineffective treatments:  Antacids and OTC medications (Vicodin) Associated symptoms: anorexia, chills, nausea and vomiting   Associated symptoms: no chest pain, no constipation, no cough, no  diarrhea, no fever, no vaginal bleeding and no vaginal discharge   Associated symptoms comment:  Back pain Risk factors: multiple surgeries   Risk factors: no alcohol abuse, no NSAID use and not pregnant     Past Medical History  Diagnosis Date  . Depression   . Arthritis   . Obesity    Past Surgical History  Procedure Laterality Date  . Cholecystectomy    . Tubal ligation     Family History  Problem Relation Age of Onset  . Depression Mother     bipolar/schizo  . Heart disease Mother   . Diabetes Mother   . Asthma Father   . Hyperlipidemia Father   . Hypertension Father   . Depression Maternal Grandmother   . Heart disease Maternal Grandmother   . Diabetes Maternal Grandmother   . Hypertension Maternal Grandfather    History  Substance Use Topics  . Smoking status: Current Every Day Smoker -- 1.00 packs/day  . Smokeless tobacco: Not on file  . Alcohol Use: No   OB History   Grav Para Term Preterm Abortions TAB SAB Ect Mult Living                 Review of Systems  Constitutional: Positive for chills. Negative for fever.  Respiratory: Negative for cough and wheezing.   Cardiovascular: Negative for chest pain.  Gastrointestinal: Positive for nausea, vomiting, abdominal pain and anorexia. Negative for diarrhea and constipation.  Genitourinary: Negative for vaginal bleeding and  vaginal discharge.  Skin: Negative for rash.  All other systems reviewed and are negative.    Allergies  Penicillins  Home Medications   Current Outpatient Rx  Name  Route  Sig  Dispense  Refill  . famotidine (PEPCID) 20 MG tablet   Oral   Take 1 tablet (20 mg total) by mouth 2 (two) times daily.   30 tablet   0   . HYDROmorphone (DILAUDID) 4 MG tablet   Oral   Take 1 tablet (4 mg total) by mouth every 6 (six) hours as needed for moderate pain or severe pain.   25 tablet   0   . metoCLOPramide (REGLAN) 10 MG tablet   Oral   Take 1 tablet (10 mg total) by mouth every 6  (six) hours as needed for nausea.   30 tablet   0    BP 103/86  Pulse 74  Temp(Src) 98 F (36.7 C) (Rectal)  Resp 20  Ht 5\' 4"  (1.626 m)  Wt 350 lb 9 oz (159.014 kg)  BMI 60.14 kg/m2  SpO2 100%  LMP 06/02/2013 Physical Exam  Nursing note and vitals reviewed. Constitutional: She is oriented to person, place, and time. She appears well-developed and well-nourished.  HENT:  Head: Normocephalic and atraumatic.  Eyes: EOM are normal.  Cardiovascular: Normal rate, regular rhythm and intact distal pulses.   No murmur heard. Pulmonary/Chest: Tachypnea noted. No respiratory distress. She has no decreased breath sounds. She has no wheezes. She has no rhonchi.  Abdominal: Soft. There is tenderness. There is guarding. There is no rebound, no CVA tenderness and negative Murphy's sign.    Neurological: She is alert and oriented to person, place, and time. She exhibits normal muscle tone. Coordination normal.  Skin: Skin is warm. No rash noted. She is not diaphoretic.    ED Course  Procedures (including critical care time) Labs Review Labs Reviewed  CBC WITH DIFFERENTIAL - Abnormal; Notable for the following:    WBC 11.0 (*)    All other components within normal limits  COMPREHENSIVE METABOLIC PANEL - Abnormal; Notable for the following:    Glucose, Bld 109 (*)    All other components within normal limits  URINALYSIS, ROUTINE W REFLEX MICROSCOPIC - Abnormal; Notable for the following:    APPearance CLOUDY (*)    Ketones, ur 15 (*)    All other components within normal limits  LIPASE, BLOOD  OCCULT BLOOD, POC DEVICE  POCT PREGNANCY, URINE   Imaging Review No results found.  EKG Interpretation    Date/Time:  Sunday July 16 2013 00:21:21 EST Ventricular Rate:  70 PR Interval:  144 QRS Duration: 86 QT Interval:  399 QTC Calculation: 430 R Axis:   42 Text Interpretation:  Sinus rhythm Low voltage, precordial leads Borderline T abnormalities, anterior leads T wave abn's  more pronounced in inf and lateral leads more flat Borderline ECG Confirmed by [facility]  MD, MICHEAL ([id number]) on 07/16/2013 12:49:22 AM           ra sat is 100% and I interpret to be normal  12:53 AM Hemoccult is negative today, Hgb is stable, WBC is up, but improved from prior values, lytes and lipase also again normal.  No fever on rectal temp.  Pt reports that percocet didn't help pain either.  At this point, pt reports feeling improved, she is laying still, no longer appears to be in pain and denies nausea.  She is slightly drowsy from IV dilaudid.  Will challenge PO's  and see if she can tolerate.  If she can, will start on PO pepcid, Rx for reglan and hydromorphone to see if she can remain stable until her GI appt on 12/6.      1:33 AM Pt has ambulated on her own to the bathroom, no distress noted.    MDM   1. Abdominal pain      I reviewed prior visits, labs, films.  Pt had neg CT and plain films.  Pt has had heme positive stools 2 successive visits, however no change in Hgb.  However WBC has remained mildly elevated for each visit.  Pt is tender on exam.  Will consider repeat CT at this point, and if symptoms cannot be controlled easily, would consider admission for intractable pain and vomiting with now 6 visits and report not improved with her prescriptions.  Pt was given percocet as well 1.5 weeks ago and has used it up.      Gavin Pound. Oletta Lamas, MD 07/16/13 [phone]

## 2013-07-15 NOTE — ED Notes (Signed)
Lt. Upper quad abd. Pain. Pain radiating from back to front? Some n/v. No diarrhea.

## 2013-07-16 LAB — POCT PREGNANCY, URINE: Preg Test, Ur: NEGATIVE

## 2013-07-16 LAB — URINALYSIS, ROUTINE W REFLEX MICROSCOPIC
Bilirubin Urine: NEGATIVE
Glucose, UA: NEGATIVE mg/dL
Hgb urine dipstick: NEGATIVE
Ketones, ur: 15 mg/dL — AB
Leukocytes, UA: NEGATIVE
Nitrite: NEGATIVE
Protein, ur: NEGATIVE mg/dL
Specific Gravity, Urine: 1.017 (ref 1.005–1.030)
Urobilinogen, UA: 1 mg/dL (ref 0.0–1.0)
pH: 6.5 (ref 5.0–8.0)

## 2013-07-16 LAB — OCCULT BLOOD, POC DEVICE: Fecal Occult Bld: NEGATIVE

## 2013-07-16 MED ORDER — HYDROMORPHONE HCL 4 MG PO TABS: 4.0000 mg | ORAL_TABLET | Freq: Four times a day (QID) | ORAL | Status: DC | PRN

## 2013-07-16 MED ORDER — METOCLOPRAMIDE HCL 10 MG PO TABS: 10.0000 mg | ORAL_TABLET | Freq: Four times a day (QID) | ORAL | Status: DC | PRN

## 2013-07-16 MED ORDER — HYDROMORPHONE HCL PF 1 MG/ML IJ SOLN
0.5000 mg | Freq: Once | INTRAMUSCULAR | Status: AC
  Administered 2013-07-16: 0.5 mg via INTRAVENOUS
  Filled 2013-07-16: qty 1

## 2013-07-16 MED ORDER — FAMOTIDINE 20 MG PO TABS: 20.0000 mg | ORAL_TABLET | Freq: Two times a day (BID) | ORAL | Status: DC

## 2013-07-16 NOTE — ED Notes (Signed)
Pt given grape juice 

## 2013-07-16 NOTE — ED Notes (Signed)
Pt keeping grape down stated has stomach pain, hs no nausea

## 2013-07-22 ENCOUNTER — Ambulatory Visit: Payer: No Typology Code available for payment source | Attending: Internal Medicine | Admitting: Internal Medicine

## 2013-07-22 VITALS — BP 139/79 | HR 78 | Temp 98.7°F | Ht 64.0 in | Wt 353.6 lb

## 2013-07-22 DIAGNOSIS — R109 Unspecified abdominal pain: Secondary | ICD-10-CM | POA: Insufficient documentation

## 2013-07-22 DIAGNOSIS — G8929 Other chronic pain: Secondary | ICD-10-CM | POA: Insufficient documentation

## 2013-07-22 DIAGNOSIS — Z7689 Persons encountering health services in other specified circumstances: Secondary | ICD-10-CM

## 2013-07-22 DIAGNOSIS — Z7189 Other specified counseling: Secondary | ICD-10-CM

## 2013-07-22 LAB — LIPID PANEL
HDL: 35 mg/dL — ABNORMAL LOW (ref >39)
LDL Cholesterol: 119 mg/dL — ABNORMAL HIGH (ref 0–99)
Total CHOL/HDL Ratio: 4.9 Ratio
VLDL: 17 mg/dL (ref 0–40)

## 2013-07-22 LAB — TSH: TSH: 2.331 u[IU]/mL (ref 0.350–4.500)

## 2013-07-22 NOTE — Progress Notes (Signed)
[name], female   DOB: 10/08/1970, 42 y.o.   MRN: [id number]   CC:  HPI: 42 year old female who has been seen 5 times previously for abdominal pain. The pain is mostly in the epigastric region and below her breast line. She has had 2 CAT scans done that did not show any acute findings. The patient has had a negative CMP panel, negative urinalysis, stool guaiac has been positive x2. Hemoglobin has been stable She is currently taking Pepcid, Dilaudid, Phenergan for her symptoms Pain initially was in "spine" in her middle back and went to her left flank. Now involves more left upper quadrant and epigastrium. Some soreness to right side that she attributes to retching. Vomited 3 times today  She denies any use of alcohol She is refusing flu vaccination today She denies any cardiopulmonary symptoms, dependent edema, She currently smokes half a pack a day    Allergies  Allergen Reactions  . Penicillins Hives   Past Medical History  Diagnosis Date  . Depression   . Arthritis   . Obesity    Current Outpatient Prescriptions on File Prior to Visit  Medication Sig Dispense Refill  . famotidine (PEPCID) 20 MG tablet Take 1 tablet (20 mg total) by mouth 2 (two) times daily.  30 tablet  0  . HYDROmorphone (DILAUDID) 4 MG tablet Take 1 tablet (4 mg total) by mouth every 6 (six) hours as needed for moderate pain or severe pain.  25 tablet  0   No current facility-administered medications on file prior to visit.   Family History  Problem Relation Age of Onset  . Depression Mother     bipolar/schizo  . Heart disease Mother   . Diabetes Mother   . Asthma Father   . Hyperlipidemia Father   . Hypertension Father   . Depression Maternal Grandmother   . Heart disease Maternal Grandmother   . Diabetes Maternal Grandmother   . Hypertension Maternal Grandfather    History   Social History  . Marital Status: Single    Spouse Name: N/A    Number of Children: N/A  . Years  of Education: N/A   Occupational History  . Not on file.   Social History Main Topics  . Smoking status: Current Every Day Smoker -- 1.00 packs/day  . Smokeless tobacco: Not on file  . Alcohol Use: No  . Drug Use: Yes    Special: Marijuana  . Sexual Activity: Not on file   Other Topics Concern  . Not on file   Social History Narrative   Single, Unemployed, attended some high school. She stays with different friends, no stable home. Public transportation.    Review of Systems  Constitutional: Negative for fever, chills, diaphoresis, activity change, appetite change and fatigue.  HENT: Negative for ear pain, nosebleeds, congestion, facial swelling, rhinorrhea, neck pain, neck stiffness and ear discharge.   Eyes: Negative for pain, discharge, redness, itching and visual disturbance.  Respiratory: Negative for cough, choking, chest tightness, shortness of breath, wheezing and stridor.   Cardiovascular: Negative for chest pain, palpitations and leg swelling.  Gastrointestinal: Negative for abdominal distention.  Genitourinary: Negative for dysuria, urgency, frequency, hematuria, flank pain, decreased urine volume, difficulty urinating and dyspareunia.  Musculoskeletal: Negative for back pain, joint swelling, arthralgias and gait problem.  Neurological: Negative for dizziness, tremors, seizures, syncope, facial asymmetry, speech difficulty, weakness, light-headedness, numbness and headaches.  Hematological: Negative for adenopathy. Does not bruise/bleed easily.  Psychiatric/Behavioral: Negative for hallucinations, behavioral problems,  confusion, dysphoric mood, decreased concentration and agitation.    Objective:   Filed Vitals:   07/22/13 1110  BP: 139/79  Pulse: 78  Temp: 98.7 F (37.1 C)    Physical Exam  Constitutional: Appears well-developed and well-nourished. No distress.  HENT: Normocephalic. External right and left ear normal. Oropharynx is clear and moist.  Eyes:  Conjunctivae and EOM are normal. PERRLA, no scleral icterus.  Neck: Normal ROM. Neck supple. No JVD. No tracheal deviation. No thyromegaly.  CVS: RRR, S1/S2 +, no murmurs, no gallops, no carotid bruit.  Pulmonary: Effort and breath sounds normal, no stridor, rhonchi, wheezes, rales.  Abdominal: Soft. BS +,  no distension, tenderness, rebound or guarding.  Musculoskeletal: Normal range of motion. No edema and no tenderness.  Lymphadenopathy: No lymphadenopathy noted, cervical, inguinal. Neuro: Alert. Normal reflexes, muscle tone coordination. No cranial nerve deficit. Skin: Skin is warm and dry. No rash noted. Not diaphoretic. No erythema. No pallor.  Psychiatric: Normal mood and affect. Behavior, judgment, thought content normal.   Lab Results  Component Value Date   WBC 11.0* 07/15/2013   HGB 13.1 07/15/2013   HCT 38.2 07/15/2013   MCV 88.6 07/15/2013   PLT 257 07/15/2013   Lab Results  Component Value Date   CREATININE 0.67 07/15/2013   BUN 6 07/15/2013   NA 136 07/15/2013   K 3.5 07/15/2013   CL 101 07/15/2013   CO2 26 07/15/2013    No results found for this basename: HGBA1C   Lipid Panel  No results found for this basename: chol, trig, hdl, cholhdl, vldl, ldlcalc       Assessment and plan:   Patient Active Problem List   Diagnosis Date Noted  . Morbid obesity 05/04/2012  . Depression 05/04/2012  . Chronic pain 05/04/2012  . Tobacco abuse 05/04/2012       Chronic abdominal pain Feel that the patient probably has thoracic spine pain with radiation to the front She also has extremely heavy breasts may be good candidate for breast reduction GI referral has been provided for EGD and colonoscopy for positive stool guaiac Thoracic spine x-rays   Establish care Will schedule mammogram Gynecologic referral for Pap smear, breast exam Refusing flu vaccination  Routine followup in 3 months  The patient was given clear instructions to go to ER or return to medical  center if symptoms don't improve, worsen or new problems develop. The patient verbalized understanding. The patient was told to call to get any lab results if not heard anything in the next week.

## 2013-07-25 ENCOUNTER — Telehealth: Payer: Self-pay | Admitting: Emergency Medicine

## 2013-07-25 NOTE — Telephone Encounter (Signed)
Pt informed of normal lab results

## 2013-07-25 NOTE — Telephone Encounter (Signed)
Message copied by Darlis Loan on Tue Jul 25, 2013  2:18 PM ------      Message from: Susie Cassette MD, [facility]      Created: Tue Jul 25, 2013  2:16 PM       Please notify patient of the patient's lipid panel and thyroid function was normal ------

## 2013-07-27 ENCOUNTER — Other Ambulatory Visit: Payer: Self-pay | Admitting: Obstetrics and Gynecology

## 2013-07-27 DIAGNOSIS — Z1231 Encounter for screening mammogram for malignant neoplasm of breast: Secondary | ICD-10-CM

## 2013-08-22 ENCOUNTER — Encounter (HOSPITAL_COMMUNITY): Payer: Self-pay

## 2013-08-22 ENCOUNTER — Ambulatory Visit (HOSPITAL_COMMUNITY)
Admission: RE | Admit: 2013-08-22 | Discharge: 2013-08-22 | Disposition: A | Discharge status: Home / Self Care | Payer: Self-pay | Source: Ambulatory Visit | Attending: Obstetrics and Gynecology | Admitting: Obstetrics and Gynecology

## 2013-08-22 ENCOUNTER — Ambulatory Visit (HOSPITAL_COMMUNITY)
Admission: RE | Admit: 2013-08-22 | Discharge: 2013-08-22 | Disposition: A | Discharge status: Home / Self Care | Payer: No Typology Code available for payment source | Source: Ambulatory Visit | Attending: Obstetrics and Gynecology | Admitting: Obstetrics and Gynecology

## 2013-08-22 VITALS — BP 124/80 | Temp 97.9°F | Ht 64.0 in | Wt 351.6 lb

## 2013-08-22 DIAGNOSIS — Z01419 Encounter for gynecological examination (general) (routine) without abnormal findings: Secondary | ICD-10-CM

## 2013-08-22 DIAGNOSIS — Z1231 Encounter for screening mammogram for malignant neoplasm of breast: Secondary | ICD-10-CM

## 2013-08-22 HISTORY — DX: Dorsalgia, unspecified: M54.9

## 2013-08-22 NOTE — Progress Notes (Signed)
No complaints today.  Pap Smear:    Pap smear completed today. Patients last Pap smear was in 2000 at [facility] and normal per patient. Per patient has no history of an abnormal Pap smear. No Pap smear results in EPIC.  Physical exam: Breasts Breasts symmetrical. No skin abnormalities bilateral breasts. No nipple retraction right breast. Left nipple inverted per patient is normal for her. No nipple discharge bilateral breasts. No lymphadenopathy. No lumps palpated bilateral breasts. No complaints of pain or tenderness on exam. Patient escorted to mammography for a screening mammogram.         Pelvic/Bimanual   Ext Genitalia No lesions, no swelling and no discharge observed on external genitalia.         Vagina Vagina pink and normal texture. No lesions or discharge observed in vagina.          Cervix Cervix is present. Cervix pink and of normal texture. No discharge observed.     Uterus Uterus is present and palpable. Uterus in normal position and difficult to palpate size of uterus due to patient being morbidly obese.     Adnexae Bilateral ovaries present and unable to palpate due to patient being morbidly obese. No tenderness on palpation.          Rectovaginal No rectal exam completed today since patient had no rectal complaints. No skin abnormalities observed on exam.

## 2013-08-22 NOTE — Patient Instructions (Signed)
Taught Sandra Orr how to perform BSE and gave educational materials to take home. Let her know [facility] will cover Pap smears every 3 years unless has a history of abnormal Pap smears. Let patient know will follow up with her within the next couple weeks with results by letter and/or phone. Smoking cessation discussed with patient. [facility] scheduled for Friday, September 08, 2013 at 1100. Patient aware of appointment and will be there. Sandra Orr verbalized understanding. Patient escorted to mammography for a screening mammogram.  Jakhai Fant, Kathaleen Maserhristine Poll, RN 2:22 PM

## 2013-08-30 ENCOUNTER — Telehealth (HOSPITAL_COMMUNITY): Payer: Self-pay

## 2013-08-30 NOTE — Telephone Encounter (Signed)
Called and informed that PAP smear was negative.

## 2013-09-08 ENCOUNTER — Ambulatory Visit: Payer: No Typology Code available for payment source

## 2013-09-08 ENCOUNTER — Other Ambulatory Visit: Payer: No Typology Code available for payment source

## 2013-10-12 ENCOUNTER — Telehealth: Payer: Self-pay

## 2013-10-12 NOTE — Telephone Encounter (Signed)
I called to see if patient thought she could make it to the appointment 2/27 at 11 AM or if needed to reschedule? Patient stated that wanted to reschedule and would call back.to reschedule.

## 2013-10-13 ENCOUNTER — Ambulatory Visit: Payer: No Typology Code available for payment source

## 2013-10-20 ENCOUNTER — Ambulatory Visit: Payer: No Typology Code available for payment source

## 2014-01-24 ENCOUNTER — Emergency Department (HOSPITAL_COMMUNITY)
Admission: EM | Admit: 2014-01-24 | Discharge: 2014-01-24 | Disposition: A | Discharge status: Home / Self Care | Payer: Self-pay | Attending: Emergency Medicine | Admitting: Emergency Medicine

## 2014-01-24 ENCOUNTER — Encounter (HOSPITAL_COMMUNITY): Payer: Self-pay | Admitting: Emergency Medicine

## 2014-01-24 DIAGNOSIS — Z8659 Personal history of other mental and behavioral disorders: Secondary | ICD-10-CM | POA: Insufficient documentation

## 2014-01-24 DIAGNOSIS — F17200 Nicotine dependence, unspecified, uncomplicated: Secondary | ICD-10-CM | POA: Insufficient documentation

## 2014-01-24 DIAGNOSIS — Z8739 Personal history of other diseases of the musculoskeletal system and connective tissue: Secondary | ICD-10-CM | POA: Insufficient documentation

## 2014-01-24 DIAGNOSIS — R1013 Epigastric pain: Secondary | ICD-10-CM | POA: Insufficient documentation

## 2014-01-24 DIAGNOSIS — R112 Nausea with vomiting, unspecified: Secondary | ICD-10-CM | POA: Insufficient documentation

## 2014-01-24 DIAGNOSIS — Z880 Allergy status to penicillin: Secondary | ICD-10-CM | POA: Insufficient documentation

## 2014-01-24 DIAGNOSIS — R630 Anorexia: Secondary | ICD-10-CM | POA: Insufficient documentation

## 2014-01-24 DIAGNOSIS — Z9089 Acquired absence of other organs: Secondary | ICD-10-CM | POA: Insufficient documentation

## 2014-01-24 DIAGNOSIS — E669 Obesity, unspecified: Secondary | ICD-10-CM | POA: Insufficient documentation

## 2014-01-24 DIAGNOSIS — R197 Diarrhea, unspecified: Secondary | ICD-10-CM | POA: Insufficient documentation

## 2014-01-24 LAB — COMPREHENSIVE METABOLIC PANEL
ALT: 16 U/L (ref 0–35)
AST: 19 U/L (ref 0–37)
Albumin: 3.7 g/dL (ref 3.5–5.2)
Alkaline Phosphatase: 90 U/L (ref 39–117)
BUN: 10 mg/dL (ref 6–23)
CO2: 21 meq/L (ref 19–32)
Calcium: 9.5 mg/dL (ref 8.4–10.5)
Chloride: 97 mEq/L (ref 96–112)
Creatinine, Ser: 0.85 mg/dL (ref 0.50–1.10)
GFR calc Af Amer: >90 mL/min (ref >90)
GFR, EST NON AFRICAN AMERICAN: 83 mL/min — AB (ref >90)
GLUCOSE: 114 mg/dL — AB (ref 70–99)
Potassium: 3.9 mEq/L (ref 3.7–5.3)
Sodium: 136 mEq/L — ABNORMAL LOW (ref 137–147)
TOTAL PROTEIN: 8.1 g/dL (ref 6.0–8.3)
Total Bilirubin: 0.4 mg/dL (ref 0.3–1.2)

## 2014-01-24 LAB — CBC WITH DIFFERENTIAL/PLATELET
Basophils Absolute: 0 10*3/uL (ref 0.0–0.1)
Basophils Relative: 0 % (ref 0–1)
EOS ABS: 0.1 10*3/uL (ref 0.0–0.7)
EOS PCT: 1 % (ref 0–5)
HCT: 41.2 % (ref 36.0–46.0)
HEMOGLOBIN: 13.8 g/dL (ref 12.0–15.0)
LYMPHS ABS: 1.2 10*3/uL (ref 0.7–4.0)
Lymphocytes Relative: 13 % (ref 12–46)
MCH: 29.1 pg (ref 26.0–34.0)
MCHC: 33.5 g/dL (ref 30.0–36.0)
MCV: 86.7 fL (ref 78.0–100.0)
Monocytes Absolute: 0.3 10*3/uL (ref 0.1–1.0)
Monocytes Relative: 3 % (ref 3–12)
Neutro Abs: 7.6 10*3/uL (ref 1.7–7.7)
Neutrophils Relative %: 83 % — ABNORMAL HIGH (ref 43–77)
Platelets: 239 10*3/uL (ref 150–400)
RBC: 4.75 MIL/uL (ref 3.87–5.11)
RDW: 15.5 % (ref 11.5–15.5)
WBC: 9.2 10*3/uL (ref 4.0–10.5)

## 2014-01-24 LAB — LIPASE, BLOOD: LIPASE: 22 U/L (ref 11–59)

## 2014-01-24 MED ORDER — ONDANSETRON HCL 4 MG PO TABS: 4.0000 mg | ORAL_TABLET | Freq: Three times a day (TID) | ORAL | Status: DC | PRN

## 2014-01-24 MED ORDER — SODIUM CHLORIDE 0.9 % IV BOLUS (SEPSIS)
1000.0000 mL | Freq: Once | INTRAVENOUS | Status: AC
  Administered 2014-01-24: 1000 mL via INTRAVENOUS

## 2014-01-24 MED ORDER — ONDANSETRON 4 MG PO TBDP
8.0000 mg | ORAL_TABLET | Freq: Once | ORAL | Status: AC
  Administered 2014-01-24: 8 mg via ORAL
  Filled 2014-01-24: qty 2

## 2014-01-24 NOTE — ED Notes (Signed)
Pt comfortable with discharge and follow up instructions. Prescriptions x1 

## 2014-01-24 NOTE — ED Provider Notes (Addendum)
CSN: [id number]     Arrival date & time 01/24/14  1944 History   First MD Initiated Contact with Patient 01/24/14 2127     Chief Complaint  Patient presents with  . N/V/D      (Consider location/radiation/quality/duration/timing/severity/associated sxs/prior Treatment) HPI Comments: This is a 43y/o AAF presenting with 8/10 sharp upper abd pain that began at 1pm today. States she awoke this morning feeling fine, although she had decreased appetite. Last meal was dinner yesterday (ate fried chicken). Reports nausea and bilious emesis, and watery yellowish diarrhea, both >10 occurrences since onset. She tried pepto bismol with no relief. + chills but no fever. + sick contacts at home (neighbor with similar symptoms). Denies bloody emesis or blood in her stools. Hx of cholecystectomy.   Patient is a 43 y.o. female presenting with abdominal pain. The history is provided by the patient.  Abdominal Pain Pain location:  Epigastric Pain quality: sharp   Pain radiates to:  Does not radiate Pain severity now: 8/10. Onset quality:  Sudden Duration: since 1pm. Timing:  Constant Progression:  Waxing and waning Chronicity:  Recurrent (has appt in one month to see GI specialist) Context: sick contacts (neighbor was ill with similar symptoms recently)   Context: not recent travel   Context comment:  Woke up feeling fine but with no appetite, and then at 1pm developed abd pain and diarrhea Relieved by:  Nothing (tried Pepto bismol which she states "came back up") Exacerbated by: gagging/retching. Ineffective treatments:  Antacids (pepto bismol) Associated symptoms: anorexia, chills, diarrhea (watery and yellowish, no blood noted), nausea and vomiting (yellowish emesis)   Associated symptoms: no chest pain, no cough, no dysuria, no hematemesis, no hematochezia, no hematuria, no melena, no shortness of breath, no sore throat, no vaginal bleeding and no vaginal discharge  Fever: unsure.   Diarrhea:     Quality:  Watery   Number of occurrences:  >10 times   Severity:  Moderate   Diarrhea duration: since 1pm.   Timing:  Constant   Progression:  Unchanged Nausea:    Severity:  Mild   Onset quality:  Sudden   Nausea duration: since 1pm.   Timing:  Intermittent   Progression:  Waxing and waning Vomiting:    Quality:  Bilious material   Number of occurrences:  Unable to specify   Severity:  Mild   Timing:  Intermittent   Progression:  Unchanged Risk factors: obesity   Risk factors: no alcohol abuse, no aspirin use, has not had multiple surgeries and no NSAID use     Past Medical History  Diagnosis Date  . Depression   . Arthritis   . Obesity   . Back pain    Past Surgical History  Procedure Laterality Date  . Cholecystectomy    . Tubal ligation     Family History  Problem Relation Age of Onset  . Depression Mother     bipolar/schizo  . Heart disease Mother   . Diabetes Mother   . Asthma Father   . Hyperlipidemia Father   . Hypertension Father   . Depression Maternal Grandmother   . Heart disease Maternal Grandmother   . Diabetes Maternal Grandmother   . Hypertension Maternal Grandfather    History  Substance Use Topics  . Smoking status: Current Every Day Smoker -- 1.00 packs/day for 20 years  . Smokeless tobacco: Not on file  . Alcohol Use: No   OB History   Grav Para Term Preterm Abortions TAB SAB Ect  Mult Living   3 3 3       3      Review of Systems  Constitutional: Positive for chills and appetite change (decreased since this AM). Negative for diaphoresis. Fever: unsure.  HENT: Negative for congestion, ear pain, postnasal drip, rhinorrhea and sore throat.   Eyes: Negative for pain.  Respiratory: Negative for cough, chest tightness, shortness of breath and wheezing.   Cardiovascular: Negative for chest pain.  Gastrointestinal: Positive for nausea, vomiting (yellowish emesis), abdominal pain, diarrhea (watery and yellowish, no blood noted) and anorexia.  Negative for blood in stool, melena, hematochezia and hematemesis.  Genitourinary: Negative for dysuria, hematuria, flank pain, vaginal bleeding, vaginal discharge, difficulty urinating and pelvic pain.  Musculoskeletal: Negative for arthralgias and myalgias.  Skin: Negative for rash.  Neurological: Negative for dizziness, weakness and light-headedness.  Psychiatric/Behavioral: Negative.       Allergies  Penicillins  Home Medications   Prior to Admission medications   Not on File   BP 136/107  Pulse 102  Temp(Src) 99 F (37.2 C) (Oral)  Ht 5\' 4"  (1.626 m)  Wt 359 lb 11.2 oz (163.159 kg)  BMI 61.71 kg/m2  SpO2 97% Physical Exam  Nursing note and vitals reviewed. Constitutional: She is oriented to person, place, and time. She appears well-developed and well-nourished.  Obese  HENT:  Head: Normocephalic and atraumatic.  Mouth/Throat: Mucous membranes are dry.  Eyes: EOM are normal. Pupils are equal, round, and reactive to light. No scleral icterus.  Neck: Normal range of motion. Neck supple.  Cardiovascular: Normal rate, regular rhythm and normal heart sounds.   Exam limited by body habitus  Pulmonary/Chest: Effort normal and breath sounds normal. She has no wheezes.  Abdominal: Soft. Bowel sounds are normal. She exhibits no distension. There is tenderness (epigastric). There is no rebound and no guarding.  Exam limited by body habitus  Musculoskeletal: Normal range of motion.  Neurological: She is alert and oriented to person, place, and time.  Skin: Skin is warm and dry.  Pt has sweater zipped up and is covered with multiple blankets, but does not feel febrile or diaphoretic  Psychiatric: She has a normal mood and affect. Her behavior is normal. Thought content normal.    ED Course  Procedures (including critical care time) Labs Review Labs Reviewed  CBC WITH DIFFERENTIAL  COMPREHENSIVE METABOLIC PANEL    Imaging Review No results found.   EKG  Interpretation None      MDM   Final diagnoses:  None    Pt with diffuse abd pain and vomiting, pt has a hx of cholecystectomy. CBC w/ diff, CMP, and lipase all within normal limits. I believe this is viral in etiology given her recent sick contacts and the sudden onset earlier today. Pt feels better with Zofran and Fluid bolus, and mucous membranes moistened. PO challenged prior to discharge and pt tolerated food and drink well. Will discharge with Zofran 4mg  q8h PRN nausea and instructed pt to drink plenty of fluids. Follow up as needed with PCP, in addition to GI specialist next month as already scheduled.    Donnita Falls [place], [place] 01/24/14 2334  Donnita Falls Camprubi-Soms, PA-C 01/24/14 [address] [place], [place] 01/25/14 [phone]

## 2014-01-24 NOTE — ED Notes (Addendum)
Pt in c/o n/v/d and chills since 1pm today, pt states she called a nursing line and they told her to come in, unsure of fever at home, ambulatory without distress- also c/o cough since earlier today, c/o abdominal soreness when coughing

## 2014-01-24 NOTE — Discharge Instructions (Signed)
Nausea and Vomiting Nausea is a sick feeling that often comes before throwing up (vomiting). Vomiting is a reflex where stomach contents come out of your mouth. Vomiting can cause severe loss of body fluids (dehydration). Children and elderly adults can become dehydrated quickly, especially if they also have diarrhea. Nausea and vomiting are symptoms of a condition or disease. It is important to find the cause of your symptoms. CAUSES   Direct irritation of the stomach lining. This irritation can result from increased acid production (gastroesophageal reflux disease), infection, food poisoning, taking certain medicines (such as nonsteroidal anti-inflammatory drugs), alcohol use, or tobacco use.  Signals from the brain.These signals could be caused by a headache, heat exposure, an inner ear disturbance, increased pressure in the brain from injury, infection, a tumor, or a concussion, pain, emotional stimulus, or metabolic problems.  An obstruction in the gastrointestinal tract (bowel obstruction).  Illnesses such as diabetes, hepatitis, gallbladder problems, appendicitis, kidney problems, cancer, sepsis, atypical symptoms of a heart attack, or eating disorders.  Medical treatments such as chemotherapy and radiation.  Receiving medicine that makes you sleep (general anesthetic) during surgery. DIAGNOSIS Your caregiver may ask for tests to be done if the problems do not improve after a few days. Tests may also be done if symptoms are severe or if the reason for the nausea and vomiting is not clear. Tests may include:  Urine tests.  Blood tests.  Stool tests.  Cultures (to look for evidence of infection).  X-rays or other imaging studies. Test results can help your caregiver make decisions about treatment or the need for additional tests. TREATMENT You need to stay well hydrated. Drink frequently but in small amounts.You may wish to drink water, sports drinks, clear broth, or eat frozen  ice pops or gelatin dessert to help stay hydrated.When you eat, eating slowly may help prevent nausea.There are also some antinausea medicines that may help prevent nausea. HOME CARE INSTRUCTIONS   Take all medicine as directed by your caregiver.  If you do not have an appetite, do not force yourself to eat. However, you must continue to drink fluids.  If you have an appetite, eat a normal diet unless your caregiver tells you differently.  Eat a variety of complex carbohydrates (rice, wheat, potatoes, bread), lean meats, yogurt, fruits, and vegetables.  Avoid high-fat foods because they are more difficult to digest.  Drink enough water and fluids to keep your urine clear or pale yellow.  If you are dehydrated, ask your caregiver for specific rehydration instructions. Signs of dehydration may include:  Severe thirst.  Dry lips and mouth.  Dizziness.  Dark urine.  Decreasing urine frequency and amount.  Confusion.  Rapid breathing or pulse. SEEK IMMEDIATE MEDICAL CARE IF:   You have blood or brown flecks (like coffee grounds) in your vomit.  You have black or bloody stools.  You have a severe headache or stiff neck.  You are confused.  You have severe abdominal pain.  You have chest pain or trouble breathing.  You do not urinate at least once every 8 hours.  You develop cold or clammy skin.  You continue to vomit for longer than 24 to 48 hours.  You have a fever. MAKE SURE YOU:   Understand these instructions.  Will watch your condition.  Will get help right away if you are not doing well or get worse. Document Released: 08/03/2005 Document Revised: 10/26/2011 Document Reviewed: 12/31/2010 [facility] Patient Information 2014 [place], [place].  Emergency Department Resource Guide  1) Find a Librarian, academic and Pay Out of Pocket Although you won't have to find out who is covered by your insurance plan, it is a good idea to ask around and get recommendations. You will  then need to call the office and see if the doctor you have chosen will accept you as a new patient and what types of options they offer for patients who are self-pay. Some doctors offer discounts or will set up payment plans for their patients who do not have insurance, but you will need to ask so you aren't surprised when you get to your appointment.  2) Contact Your Local Health Department Not all health departments have doctors that can see patients for sick visits, but many do, so it is worth a call to see if yours does. If you don't know where your local health department is, you can check in your phone book. The CDC also has a tool to help you locate your state's health department, and many state websites also have listings of all of their local health departments.  3) Find a Walk-in Clinic If your illness is not likely to be very severe or complicated, you may want to try a walk in clinic. These are popping up all over the country in pharmacies, drugstores, and shopping centers. They're usually staffed by nurse practitioners or physician assistants that have been trained to treat common illnesses and complaints. They're usually fairly quick and inexpensive. However, if you have serious medical issues or chronic medical problems, these are probably not your best option.  No Primary Care Doctor: - Call Health Connect at  [phone] - they can help you locate a primary care doctor that  accepts your insurance, provides certain services, etc. - Physician Referral Service- [phone]  Chronic Pain Problems: Organization         Address  Phone   Notes  [facility]  [phone] Patients need to be referred by their primary care doctor.   Medication Assistance: Organization         Address  Phone   Notes  [facility] Medication [facility] [address] [place] [place], [place] [address] [phone] --Must be a resident of [facility] -- Must have NO  insurance coverage whatsoever (no Medicaid/ Medicare, etc.) -- The pt. MUST have a primary care doctor that directs their care regularly and follows them in the community   [facility]  [phone]   [employer]  [phone]    Agencies that provide inexpensive medical care: Organization         Address  Phone   Notes  [facility]  [phone]   [facility]    [phone]   [facility] [address] [place], [place] [address] [phone]   [facility] [address], [place] [phone]   Planned Parenthood    [phone]   [facility]    [phone]   Community [facility] and [facility]  [address], [place] Phone:  [phone], Fax:  [phone] Hours of Operation:  9 am - 6 pm, M-F.  Also accepts Medicaid/Medicare and self-pay.  [facility]  [address],  Phone: [phone], Fax: [phone]. Hours of Operation:  8:30 am - 5:30 pm, M-F.  Also accepts Medicaid and self-pay.  [facility] [place] [address] [place] Phone: [phone]   [facility] [address] Natasha Bence [place], [place] [phone], Ext. [phone] Mondays & Thursdays: 7-9 AM.  First 15 patients are seen on a first come, first serve basis.    Medicaid-accepting [facility] Providers:  Organization         Address  Phone   Notes  [facility] [address], [place] [phone] Also accepts self-pay patients.  [facility] [address] Laurell Josephs [place], [place]  [phone]   [facility] [address], [place] [phone]   [facility] [address] [place] [phone]   Renaye Rakers [address], [place]   [phone] Only accepts [place] Access [place] patients after they have  their name applied to their card.   Self-Pay (no insurance) in [facility]:  Organization         Address  Phone   Notes  Sickle Cell Patients, [facility] Internal Medicine [address] [place], [place] [phone]   [facility] [address] [place], [place] [phone]   [facility]  [address], [place] [phone]   [facility]/Dr. Osei-Bonsu  [address] [place] or [address], [place] [phone] Phone number for both [place] and [place] locations is the same.  Urgent Medical and [facility] [address], [place] [phone]   [facility] [address], [place] or [address] [phone]   [facility] [address] [place] [phone], phone; [phone], fax Sees patients 1st and 3rd Saturday of every month.  Must not qualify for public or private insurance (i.e. Medicaid, Medicare, [place] Health Choice, Veterans' Benefits)  Household income should be no more than 200% of the poverty level The clinic cannot treat you if you are pregnant or think you are pregnant  Sexually transmitted diseases are not treated at the clinic.    Dental Care: Organization         Address  Phone  Notes  [facility] Department of [facility] [address] [place], [place] [phone] Accepts children up to age 38 who are enrolled in [place] or [place] Health Choice; pregnant women with a Medicaid card; and children who have applied for Medicaid or [place] Health Choice, but were declined, whose parents can pay a reduced fee at time of service.  [facility] of [facility]  [address], [place] [phone] Accepts children up to age 93 who are enrolled in [place] or [place] Health Choice; pregnant women with a Medicaid card; and children who have applied  for Medicaid or [place] Health Choice, but were declined, whose parents can pay a reduced fee at time of service.  [facility]  [address] [place], [place] [phone] Patients are seen by appointment only. Walk-ins are not accepted. [facility] will see patients 22 years of age and older. Monday - Tuesday (8am-5pm) Most Wednesdays (8:30-5pm) $30 per visit, cash only  [facility] Adult Dental Access PRO[facility]  [address], [facility] [phone] Patients are seen by appointment only. Walk-ins are not accepted. [facility] will see patients 13 years of age and older. One Wednesday Evening (Monthly:  Volunteer Based).  $30 per visit, cash only  [employer] [facility] [facility]  [phone]) [phone] for adults; Children under age 554, call Graduate Pediatric Dentistry at [phone]) [phone]. Children aged 684-14, please call [phone] to request a pediatric application.  Dental services are provided in all areas of dental care including fillings, crowns and bridges, complete and partial dentures, implants, gum treatment, root canals, and extractions. Preventive care is also provided. Treatment is provided to both adults and children. Patients are selected via a lottery and there is often a waiting list.   [facility] [address], [place]  [phone] [email or web address]   [facility] [address], [place], [place] [phone], Ext. [phone] Second and Fourth Thursday of each month, opens at 6:30 AM; Clinic ends at 9 AM.  Patients are seen on a first-come first-served basis, and a limited number are seen during each clinic.   [facility]  [address], [place], [place] [phone]   Eligibility Requirements You must have lived in [place], [place], or [place] counties for at least the last three months.   You cannot be eligible for state or federal sponsored [place]healthcare insurance, including CIG[employer]eterans Administration,  [place]icaid, or [employer]icare.   You generally cannot be eligible for healthcare insurance through your employer.    How to apply: Eligibility screenings are held every Tuesday and Wednesday afternoon from 1:00 pm until 4:00 pm. You do not need an appointment for the interview!  [facility] [address], [place], [place] [phone]   [facility]  [phone]   [facility]  [phone]   [facility]  [phone]    Behavioral Health Resources in the Community: Intensive Outpatient Programs Organization         Address  Phone  Notes  [facility] [address], [place], [place] [phone]   [facility] [address], [place], [place] [phone]   [facility] [address], [place], [place]  [phone]   [facility] [address],  [place], [place] [phone] or [phone]   Substance Abuse Resources Organization         Address  Phone  Notes  [facility]  [phone]   [facility]  [phone]   [facility]  [phone]   [facility]  [phone]   Residential & Outpatient Substance Abuse Program  [phone]   Psychological Services Organization         Address  Phone  Notes  [facility]  [phone]- [phone]   [facility]  [phone]- [phone]   [facility] [address], [place] [phone] or [phone]    Mobile Crisis Teams Organization         Address  Phone  Notes  [facility], Mobile Crisis Care Unit  [phone]   [facility]  [address] [place], [place] [phone]   Doristine LocksSharon DeEsch [address] [place] [phone]    Self-Help/Support Groups Organization         Address  Phone             Notes  [facility] [place] - variety of support  groups  336- [phone] Call for more information  Narcotics Anonymous (NA), [facility] [address], [place] Mentor  2 meetings at this location   Residential  Treatment Programs Organization         Address  Phone  Notes  [facility] [address]    [place]  [phone]   [facility]  [address], [place] [address], [place], [place] [phone]   [facility] [address] [place], [place] [phone] Admissions: 8am-3pm M-F  [facility] [address]    [place], [place] [phone]   [facility] [address] [place], [place], [place] [phone]   [facility] [address]  [place], [place] [phone]   [facility] - Intensive Outpatient [address], [place], [place] [phone]   [facility] (Addiction [facility] [address] [place]  [place], [place] [phone] or [phone]   [facility] ([facility]) [address] [place], [place] [phone] Accepts Medicaid  [facility] [address],  [place] [phone] Substance Abuse/Addiction Treatment   [facility] Organization         Address  Phone  Notes  [facility]  [phone]   Angie Fava, PhD [address] Ervin Knack [place], [place]   [phone] or [phone]   [facility]   [address] [place], [place] [phone]   [facility] [address], [place], [place] [phone] Insurance/Medicaid/sponsorship through [employer] and Families [address]                                    [place], [place] [phone] Therapy/tele-psych/case  [facility] [address], [place] [phone]    Dr. Lolly Mustache  [phone]   [facility]  [employer] [facility] Dept. 1) [address] [place] 2) [address] [place] 3)   [address], [place] [phone]  [phone]   [facility] Child Abuse Hotline (812)477-4553 or 514-634-2395 (After Hours)     You've been given the above resource list to help you find a Primary Care Physician

## 2014-01-25 NOTE — ED Provider Notes (Signed)
  Medical screening examination/treatment/procedure(s) were performed by non-physician practitioner and as supervising physician I was immediately available for consultation/collaboration.   EKG Interpretation None         Gerhard Munch, MD 01/25/14 0000

## 2014-01-27 NOTE — ED Provider Notes (Signed)
Medical screening examination/treatment/procedure(s) were performed by non-physician practitioner and as supervising physician I was immediately available for consultation/collaboration.    Gerhard Munchobert Willie [place], MD 01/27/14 0001

## 2014-05-29 ENCOUNTER — Emergency Department (HOSPITAL_COMMUNITY)
Admission: EM | Admit: 2014-05-29 | Discharge: 2014-05-29 | Disposition: A | Discharge status: Home / Self Care | Payer: Self-pay | Attending: Emergency Medicine | Admitting: Emergency Medicine

## 2014-05-29 ENCOUNTER — Encounter (HOSPITAL_COMMUNITY): Payer: Self-pay | Admitting: Emergency Medicine

## 2014-05-29 DIAGNOSIS — M1990 Unspecified osteoarthritis, unspecified site: Secondary | ICD-10-CM | POA: Insufficient documentation

## 2014-05-29 DIAGNOSIS — R51 Headache: Secondary | ICD-10-CM | POA: Insufficient documentation

## 2014-05-29 DIAGNOSIS — Z720 Tobacco use: Secondary | ICD-10-CM | POA: Insufficient documentation

## 2014-05-29 DIAGNOSIS — Z880 Allergy status to penicillin: Secondary | ICD-10-CM | POA: Insufficient documentation

## 2014-05-29 DIAGNOSIS — J069 Acute upper respiratory infection, unspecified: Secondary | ICD-10-CM | POA: Insufficient documentation

## 2014-05-29 DIAGNOSIS — J9801 Acute bronchospasm: Secondary | ICD-10-CM | POA: Insufficient documentation

## 2014-05-29 DIAGNOSIS — E669 Obesity, unspecified: Secondary | ICD-10-CM | POA: Insufficient documentation

## 2014-05-29 DIAGNOSIS — Z8659 Personal history of other mental and behavioral disorders: Secondary | ICD-10-CM | POA: Insufficient documentation

## 2014-05-29 MED ORDER — PREDNISONE 20 MG PO TABS
60.0000 mg | ORAL_TABLET | Freq: Once | ORAL | Status: AC
  Administered 2014-05-29: 60 mg via ORAL
  Filled 2014-05-29: qty 3

## 2014-05-29 MED ORDER — ALBUTEROL SULFATE (2.5 MG/3ML) 0.083% IN NEBU
5.0000 mg | INHALATION_SOLUTION | Freq: Once | RESPIRATORY_TRACT | Status: AC
  Administered 2014-05-29: 5 mg via RESPIRATORY_TRACT
  Filled 2014-05-29: qty 6

## 2014-05-29 MED ORDER — PREDNISONE 20 MG PO TABS: 60.0000 mg | ORAL_TABLET | Freq: Every day | ORAL | Status: DC

## 2014-05-29 MED ORDER — ALBUTEROL SULFATE HFA 108 (90 BASE) MCG/ACT IN AERS
2.0000 | INHALATION_SPRAY | RESPIRATORY_TRACT | Status: DC | PRN
  Administered 2014-05-29: 2 via RESPIRATORY_TRACT
  Filled 2014-05-29: qty 6.7

## 2014-05-29 NOTE — Discharge Instructions (Signed)
Bronchospasm °A bronchospasm is a spasm or tightening of the airways going into the lungs. During a bronchospasm breathing becomes more difficult because the airways get smaller. When this happens there can be coughing, a whistling sound when breathing (wheezing), and difficulty breathing. Bronchospasm is often associated with asthma, but not all patients who experience a bronchospasm have asthma. °CAUSES  °A bronchospasm is caused by inflammation or irritation of the airways. The inflammation or irritation may be triggered by:  °· Allergies (such as to animals, pollen, food, or mold). Allergens that cause bronchospasm may cause wheezing immediately after exposure or many hours later.   °· Infection. Viral infections are believed to be the most common cause of bronchospasm.   °· Exercise.   °· Irritants (such as pollution, cigarette smoke, strong odors, aerosol sprays, and paint fumes).   °· Weather changes. Winds increase molds and pollens in the air. Rain refreshes the air by washing irritants out. Cold air may cause inflammation.   °· Stress and emotional upset.   °SIGNS AND SYMPTOMS  °· Wheezing.   °· Excessive nighttime coughing.   °· Frequent or severe coughing with a simple cold.   °· Chest tightness.   °· Shortness of breath.   °DIAGNOSIS  °Bronchospasm is usually diagnosed through a history and physical exam. Tests, such as chest X-rays, are sometimes done to look for other conditions. °TREATMENT  °· Inhaled medicines can be given to open up your airways and help you breathe. The medicines can be given using either an inhaler or a nebulizer machine. °· Corticosteroid medicines may be given for severe bronchospasm, usually when it is associated with asthma. °HOME CARE INSTRUCTIONS  °· Always have a plan prepared for seeking medical care. Know when to call your health care provider and local emergency services (911 in the [place] Know where you can access local emergency care. °· Only take medicines as  directed by your health care provider. °· If you were prescribed an inhaler or nebulizer machine, ask your health care provider to explain how to use it correctly. Always use a spacer with your inhaler if you were given one. °· It is necessary to remain calm during an attack. Try to relax and breathe more slowly.  °· Control your home environment in the following ways:   °¨ Change your heating and air conditioning filter at least once a month.   °¨ Limit your use of fireplaces and wood stoves. °¨ Do not smoke and do not allow smoking in your home.   °¨ Avoid exposure to perfumes and fragrances.   °¨ Get rid of pests (such as roaches and mice) and their droppings.   °¨ Throw away plants if you see mold on them.   °¨ Keep your house clean and dust free.   °¨ Replace carpet with wood, tile, or vinyl flooring. Carpet can trap dander and dust.   °¨ Use allergy-proof pillows, mattress covers, and box spring covers.   °¨ Wash bed sheets and blankets every week in hot water and dry them in a dryer.   °¨ Use blankets that are made of polyester or cotton.   °¨ Wash hands frequently. °SEEK MEDICAL CARE IF:  °· You have muscle aches.   °· You have chest pain.   °· The sputum changes from clear or white to yellow, green, gray, or bloody.   °· The sputum you cough up gets thicker.   °· There are problems that may be related to the medicine you are given, such as a rash, itching, swelling, or trouble breathing.   °SEEK IMMEDIATE MEDICAL CARE IF:  °· You have worsening wheezing and coughing even   after taking your prescribed medicines.   You have increased difficulty breathing.   You develop severe chest pain. MAKE SURE YOU:   Understand these instructions.  Will watch your condition.  Will get help right away if you are not doing well or get worse. Document Released: 08/06/2003 Document Revised: 08/08/2013 Document Reviewed: 01/23/2013 [facility] Patient Information 2015 [place], [place]. This information is not  intended to replace advice given to you by your health care provider. Make sure you discuss any questions you have with your health care provider. Smoking Cessation Quitting smoking is important to your health and has many advantages. However, it is not always easy to quit since nicotine is a very addictive drug. Oftentimes, people try 3 times or more before being able to quit. This document explains the best ways for you to prepare to quit smoking. Quitting takes hard work and a lot of effort, but you can do it. ADVANTAGES OF QUITTING SMOKING  You will live longer, feel better, and live better.  Your body will feel the impact of quitting smoking almost immediately.  Within 20 minutes, blood pressure decreases. Your pulse returns to its normal level.  After 8 hours, carbon monoxide levels in the blood return to normal. Your oxygen level increases.  After 24 hours, the chance of having a heart attack starts to decrease. Your breath, hair, and body stop smelling like smoke.  After 48 hours, damaged nerve endings begin to recover. Your sense of taste and smell improve.  After 72 hours, the body is virtually free of nicotine. Your bronchial tubes relax and breathing becomes easier.  After 2 to 12 weeks, lungs can hold more air. Exercise becomes easier and circulation improves.  The risk of having a heart attack, stroke, cancer, or lung disease is greatly reduced.  After 1 year, the risk of coronary heart disease is cut in half.  After 5 years, the risk of stroke falls to the same as a nonsmoker.  After 10 years, the risk of lung cancer is cut in half and the risk of other cancers decreases significantly.  After 15 years, the risk of coronary heart disease drops, usually to the level of a nonsmoker.  If you are pregnant, quitting smoking will improve your chances of having a healthy baby.  The people you live with, especially any children, will be healthier.  You will have extra money  to spend on things other than cigarettes. QUESTIONS TO THINK ABOUT BEFORE ATTEMPTING TO QUIT You may want to talk about your answers with your health care provider.  Why do you want to quit?  If you tried to quit in the past, what helped and what did not?  What will be the most difficult situations for you after you quit? How will you plan to handle them?  Who can help you through the tough times? Your family? Friends? A health care provider?  What pleasures do you get from smoking? What ways can you still get pleasure if you quit? Here are some questions to ask your health care provider:  How can you help me to be successful at quitting?  What medicine do you think would be best for me and how should I take it?  What should I do if I need more help?  What is smoking withdrawal like? How can I get information on withdrawal? GET READY  Set a quit date.  Change your environment by getting rid of all cigarettes, ashtrays, matches, and lighters in your home,  car, or work. Do not let people smoke in your home.  Review your past attempts to quit. Think about what worked and what did not. GET SUPPORT AND ENCOURAGEMENT You have a better chance of being successful if you have help. You can get support in many ways.  Tell your family, friends, and coworkers that you are going to quit and need their support. Ask them not to smoke around you.  Get individual, group, or telephone counseling and support. Programs are available at [facility] and health [facility]. Call your local health department for information about programs in your area.  Spiritual beliefs and practices may help some smokers quit.  Download a "quit meter" on your computer to keep track of quit statistics, such as how long you have gone without smoking, cigarettes not smoked, and money saved.  Get a self-help book about quitting smoking and staying off tobacco. LEARN NEW SKILLS AND BEHAVIORS  Distract yourself from  urges to smoke. Talk to someone, go for a walk, or occupy your time with a task.  Change your normal routine. Take a different route to work. Drink tea instead of coffee. Eat breakfast in a different place.  Reduce your stress. Take a hot bath, exercise, or read a book.  Plan something enjoyable to do every day. Reward yourself for not smoking.  Explore interactive web-based programs that specialize in helping you quit. GET MEDICINE AND USE IT CORRECTLY Medicines can help you stop smoking and decrease the urge to smoke. Combining medicine with the above behavioral methods and support can greatly increase your chances of successfully quitting smoking.  Nicotine replacement therapy helps deliver nicotine to your body without the negative effects and risks of smoking. Nicotine replacement therapy includes nicotine gum, lozenges, inhalers, nasal sprays, and skin patches. Some may be available over-the-counter and others require a prescription.  Antidepressant medicine helps people abstain from smoking, but how this works is unknown. This medicine is available by prescription.  Nicotinic receptor partial agonist medicine simulates the effect of nicotine in your brain. This medicine is available by prescription. Ask your health care provider for advice about which medicines to use and how to use them based on your health history. Your health care provider will tell you what side effects to look out for if you choose to be on a medicine or therapy. Carefully read the information on the package. Do not use any other product containing nicotine while using a nicotine replacement product.  RELAPSE OR DIFFICULT SITUATIONS Most relapses occur within the first 3 months after quitting. Do not be discouraged if you start smoking again. Remember, most people try several times before finally quitting. You may have symptoms of withdrawal because your body is used to nicotine. You may crave cigarettes, be irritable,  feel very hungry, cough often, get headaches, or have difficulty concentrating. The withdrawal symptoms are only temporary. They are strongest when you first quit, but they will go away within 10-14 days. To reduce the chances of relapse, try to:  Avoid drinking alcohol. Drinking lowers your chances of successfully quitting.  Reduce the amount of caffeine you consume. Once you quit smoking, the amount of caffeine in your body increases and can give you symptoms, such as a rapid heartbeat, sweating, and anxiety.  Avoid smokers because they can make you want to smoke.  Do not let weight gain distract you. Many smokers will gain weight when they quit, usually less than 10 pounds. Eat a healthy diet and stay active.  You can always lose the weight gained after you quit.  Find ways to improve your mood other than smoking. FOR MORE INFORMATION  www.smokefree.gov  Document Released: 07/28/2001 Document Revised: 12/18/2013 Document Reviewed: 11/12/2011 [facility] Patient Information 2015 [place]. This information is not intended to replace advice given to you by your health care provider. Make sure you discuss any questions you have with your health care provider. Upper Respiratory Infection, Adult An upper respiratory infection (URI) is also known as the common cold. It is often caused by a type of germ (virus). Colds are easily spread (contagious). You can pass it to others by kissing, coughing, sneezing, or drinking out of the same glass. Usually, you get better in 1 or 2 weeks.  HOME CARE   Only take medicine as told by your doctor.  Use a warm mist humidifier or breathe in steam from a hot shower.  Drink enough water and fluids to keep your pee (urine) clear or pale yellow.  Get plenty of rest.  Return to work when your temperature is back to normal or as told by your doctor. You may use a face mask and wash your hands to stop your cold from spreading. GET HELP RIGHT AWAY IF:   After  the first few days, you feel you are getting worse.  You have questions about your medicine.  You have chills, shortness of breath, or brown or red spit (mucus).  You have yellow or brown snot (nasal discharge) or pain in the face, especially when you bend forward.  You have a fever, puffy (swollen) neck, pain when you swallow, or white spots in the back of your throat.  You have a bad headache, ear pain, sinus pain, or chest pain.  You have a high-pitched whistling sound when you breathe in and out (wheezing).  You have a lasting cough or cough up blood.  You have sore muscles or a stiff neck. MAKE SURE YOU:   Understand these instructions.  Will watch your condition.  Will get help right away if you are not doing well or get worse. Document Released: 01/20/2008 Document Revised: 10/26/2011 Document Reviewed: 11/08/2013 [facility] Patient Information 2015 [place], [place]. This information is not intended to replace advice given to you by your health care provider. Make sure you discuss any questions you have with your health care provider.   Emergency Department Resource Guide 1) Find a Doctor and Pay Out of Pocket Although you won't have to find out who is covered by your insurance plan, it is a good idea to ask around and get recommendations. You will then need to call the office and see if the doctor you have chosen will accept you as a new patient and what types of options they offer for patients who are self-pay. Some doctors offer discounts or will set up payment plans for their patients who do not have insurance, but you will need to ask so you aren't surprised when you get to your appointment.  2) Contact Your Local Health Department Not all health departments have doctors that can see patients for sick visits, but many do, so it is worth a call to see if yours does. If you don't know where your local health department is, you can check in your phone book. The CDC also has a  tool to help you locate your state's health department, and many state websites also have listings of all of their local health departments.  3) Find a Walk-in Clinic If  your illness is not likely to be very severe or complicated, you may want to try a walk in clinic. These are popping up all over the country in pharmacies, drugstores, and shopping centers. They're usually staffed by nurse practitioners or physician assistants that have been trained to treat common illnesses and complaints. They're usually fairly quick and inexpensive. However, if you have serious medical issues or chronic medical problems, these are probably not your best option.  No Primary Care Doctor: - Call Health Connect at  [phone] - they can help you locate a primary care doctor that  accepts your insurance, provides certain services, etc. - Physician Referral Service- [phone]  Chronic Pain Problems: Organization         Address  Phone   Notes  [facility]  [phone] Patients need to be referred by their primary care doctor.   Medication Assistance: Organization         Address  Phone   Notes  [facility] Medication [facility] [address] [place] [place], [place] [address] [phone] --Must be a resident of [facility] -- Must have NO insurance coverage whatsoever (no Medicaid/ Medicare, etc.) -- The pt. MUST have a primary care doctor that directs their care regularly and follows them in the community   [facility]  [phone]   [employer]  [phone]    Agencies that provide inexpensive medical care: Organization         Address  Phone   Notes  [facility]  [phone]   [facility]    [phone]   [facility] [address] [place], [place] [address] [phone]   [facility] [address] [place] [phone]   [facility]    [phone]    [facility]    [phone]   [facility]  [address], [place] Phone:  [phone], Fax:  [phone] Hours of Operation:  9 am - 6 pm, M-F.  Also accepts Medicaid/Medicare and self-pay.  [facility]  [address], [place] Phone: [phone], Fax: [phone]. Hours of Operation:  8:30 am - 5:30 pm, M-F.  Also accepts Medicaid and self-pay.  [facility] [place] [address] [place] Phone: [phone]   [facility] [address] Natasha Bence [place], [place] [phone], Ext. [phone] Mondays & Thursdays: 7-9 AM.  First 15 patients are seen on a first come, first serve basis.    Medicaid-accepting [facility] Providers:  Organization         Address  Phone   Notes  [facility] [address], [place] [phone] Also accepts self-pay patients.  [facility] [address] Laurell Josephs [place], [place]  [phone]   [facility] [address], [place] [phone]   [facility] [address] [place] [phone]   Renaye Rakers [address], [place]   [phone] Only accepts [place] patients after they have their name applied to their card.   Self-Pay (no insurance) in [facility]:  Organization         Address  Phone   Notes  Sickle Cell Patients, [facility] [address] [place], [place] [phone]  [facility] [address] [place], [place] [phone]   [facility] [place]  [address], [place] [phone]   [facility]/Dr. Osei-Bonsu  [address] [place] or [address], [place] [phone] Phone number for both [place] and [place] locations is the same.  [facility] [address], [place] [phone]   [facility] [address], [place] or [address] [phone]   [facility] [address], [place] [phone], phone; [phone], fax Sees patients 1st and 3rd Saturday of every month.  Must not qualify for public or private insurance (i.e. Medicaid, Medicare, [place] [employer] Choice, Veterans' Benefits)  Household income should be no more than 200% of the poverty level The clinic cannot treat you if you are pregnant or think you are pregnant  Sexually transmitted diseases are not treated at the clinic.

## 2014-05-29 NOTE — ED Notes (Signed)
Cough cold rib pain x 1 week hoarse

## 2014-05-29 NOTE — ED Provider Notes (Signed)
CSN: [id number]     Arrival date & time 05/29/14  1159 History  This chart was scribed for non-physician practitioner, Arnoldo HookerShari A Tari Lecount, PA-C, working with Lyanne CoKevin M Campos, MD by Charline BillsEssence Howell, ED Scribe. This patient was seen in room TR11C/TR11C and the patient's care was started at 1:01 PM.   Chief Complaint  Patient presents with  . Cough   The history is provided by the patient. No language interpreter was used.   HPI Comments: Sandra Orr is a 43 y.o. female who presents to the Emergency Department complaining of productive cough with yellow/green sputum onset 1.5 week ago. Pt reports associated intermittent pressure HA, SOB, sore throat, hoarseness, congestion, wheezing, night sweats, rhinorrhea that resolved, chills that resolved, and chest wall tedneress from coughing. Cough and SOB are worse at night. She denies recent illness. She also denies chest pain, palpitations, leg swelling, leg pain, fever, fatigue, appetite change, dysuria, urinary frequency, urinary urgency, hematuria, blood in stools, diarrhea, nausea, vomiting. Pt has tried Robitussin and Tylenol Cold and Flu Sinus tablets. Pt reports smoking .5 ppd. She last received her flu shot last year. H/o asthma as a child.   Past Medical History  Diagnosis Date  . Depression   . Arthritis   . Obesity   . Back pain    Past Surgical History  Procedure Laterality Date  . Cholecystectomy    . Tubal ligation     Family History  Problem Relation Age of Onset  . Depression Mother     bipolar/schizo  . Heart disease Mother   . Diabetes Mother   . Asthma Father   . Hyperlipidemia Father   . Hypertension Father   . Depression Maternal Grandmother   . Heart disease Maternal Grandmother   . Diabetes Maternal Grandmother   . Hypertension Maternal Grandfather    History  Substance Use Topics  . Smoking status: Current Every Day Smoker -- 1.00 packs/day for 20 years  . Smokeless tobacco: Not on file  . Alcohol Use: No    OB History   Grav Para Term Preterm Abortions TAB SAB Ect Mult Living   3 3 3       3      Review of Systems  Constitutional: Positive for chills (resolved). Negative for fever, appetite change and fatigue.  HENT: Positive for congestion, rhinorrhea (resolved) and sore throat.   Respiratory: Positive for cough, shortness of breath and wheezing.   Cardiovascular: Negative for chest pain, palpitations and leg swelling.  Gastrointestinal: Negative for nausea, vomiting, diarrhea and blood in stool.  Genitourinary: Negative for dysuria, urgency, frequency and hematuria.  Neurological: Positive for headaches.   Allergies  Penicillins  Home Medications   Prior to Admission medications   Medication Sig Start Date End Date Taking? Authorizing Provider  acetaminophen (TYLENOL) 500 MG tablet Take 500 mg by mouth every 8 (eight) hours as needed.   Yes Historical Provider, MD  guaiFENesin-dextromethorphan (ROBITUSSIN DM) 100-10 MG/5ML syrup Take 10 mLs by mouth every 4 (four) hours as needed for cough.   Yes Historical Provider, MD  pseudoephedrine-acetaminophen (TYLENOL SINUS) 30-500 MG TABS Take 2 tablets by mouth every 4 (four) hours as needed (cough/cold symptoms).   Yes Historical Provider, MD   Triage Vitals: BP 121/95  Pulse 87  Temp(Src) 98.4 F (36.9 C) (Oral)  Resp 24  Ht 5\' 4"  (1.626 m)  Wt 400 lb (181.439 kg)  BMI 68.63 kg/m2  SpO2 95% Physical Exam  Nursing note and vitals reviewed. Constitutional:  She is oriented to person, place, and time. She appears well-developed and well-nourished.  HENT:  Head: Normocephalic and atraumatic.  Neck: Neck supple.  Cardiovascular: Normal rate and regular rhythm.   Pulmonary/Chest: Effort normal. She has wheezes.  Diffuse expiratory wheezes bilaterally   Musculoskeletal: Normal range of motion.  Neurological: She is alert and oriented to person, place, and time.  Skin: Skin is warm and dry.  Psychiatric: She has a normal mood and  affect. Her behavior is normal.   ED Course  Procedures (including critical care time) DIAGNOSTIC STUDIES: Oxygen Saturation is 95% on RA, adequate by my interpretation.    COORDINATION OF CARE: 1:12 PM-Discussed treatment plan which includes nebulizer treatment with pt at bedside and pt agreed to plan.   Labs Review Labs Reviewed - No data to display  Imaging Review No results found.   EKG Interpretation None      MDM   Final diagnoses:  None    1. URI 2. Bronchospasm 3. Tobacco abuse  Afebrile illness consisting of URI symptoms, presents with wheezing that resolves with nebulizer x 1. She feels she is breathing much better. Feel symptoms c/w viral process. Inhaler provided, Rx prednisone, encouraged PCP follow up.  I personally performed the services described in this documentation, which was scribed in my presence. The recorded information has been reviewed and is accurate.     Arnoldo HookerShari A Daijon Wenke, PA-C 05/30/14 [phone]

## 2014-05-30 ENCOUNTER — Other Ambulatory Visit: Payer: Self-pay

## 2014-05-30 NOTE — ED Provider Notes (Signed)
Medical screening examination/treatment/procedure(s) were performed by non-physician practitioner and as supervising physician I was immediately available for consultation/collaboration.   EKG Interpretation None        Treniece Holsclaw M Crytal Pensinger, MD 05/30/14 1942 

## 2014-06-02 ENCOUNTER — Encounter (HOSPITAL_COMMUNITY): Payer: Self-pay | Admitting: Emergency Medicine

## 2014-06-02 ENCOUNTER — Emergency Department (HOSPITAL_COMMUNITY): Payer: Self-pay

## 2014-06-02 ENCOUNTER — Emergency Department (HOSPITAL_COMMUNITY)
Admission: EM | Admit: 2014-06-02 | Discharge: 2014-06-02 | Disposition: A | Discharge status: Home / Self Care | Payer: Self-pay | Attending: Emergency Medicine | Admitting: Emergency Medicine

## 2014-06-02 DIAGNOSIS — R0789 Other chest pain: Secondary | ICD-10-CM | POA: Insufficient documentation

## 2014-06-02 DIAGNOSIS — Z8659 Personal history of other mental and behavioral disorders: Secondary | ICD-10-CM | POA: Insufficient documentation

## 2014-06-02 DIAGNOSIS — R05 Cough: Secondary | ICD-10-CM

## 2014-06-02 DIAGNOSIS — R059 Cough, unspecified: Secondary | ICD-10-CM

## 2014-06-02 DIAGNOSIS — Z8739 Personal history of other diseases of the musculoskeletal system and connective tissue: Secondary | ICD-10-CM | POA: Insufficient documentation

## 2014-06-02 DIAGNOSIS — Z720 Tobacco use: Secondary | ICD-10-CM | POA: Insufficient documentation

## 2014-06-02 DIAGNOSIS — R0602 Shortness of breath: Secondary | ICD-10-CM | POA: Insufficient documentation

## 2014-06-02 DIAGNOSIS — Z7952 Long term (current) use of systemic steroids: Secondary | ICD-10-CM | POA: Insufficient documentation

## 2014-06-02 DIAGNOSIS — E669 Obesity, unspecified: Secondary | ICD-10-CM | POA: Insufficient documentation

## 2014-06-02 DIAGNOSIS — Z880 Allergy status to penicillin: Secondary | ICD-10-CM | POA: Insufficient documentation

## 2014-06-02 LAB — CBC WITH DIFFERENTIAL/PLATELET
Basophils Absolute: 0 10*3/uL (ref 0.0–0.1)
Basophils Relative: 0 % (ref 0–1)
Eosinophils Absolute: 0 10*3/uL (ref 0.0–0.7)
Eosinophils Relative: 0 % (ref 0–5)
HEMATOCRIT: 40.8 % (ref 36.0–46.0)
Hemoglobin: 13.2 g/dL (ref 12.0–15.0)
LYMPHS ABS: 5.7 10*3/uL — AB (ref 0.7–4.0)
Lymphocytes Relative: 32 % (ref 12–46)
MCH: 28.4 pg (ref 26.0–34.0)
MCHC: 32.4 g/dL (ref 30.0–36.0)
MCV: 87.7 fL (ref 78.0–100.0)
MONOS PCT: 8 % (ref 3–12)
Monocytes Absolute: 1.4 10*3/uL — ABNORMAL HIGH (ref 0.1–1.0)
Neutro Abs: 10.6 10*3/uL — ABNORMAL HIGH (ref 1.7–7.7)
Neutrophils Relative %: 60 % (ref 43–77)
PLATELETS: 285 10*3/uL (ref 150–400)
RBC: 4.65 MIL/uL (ref 3.87–5.11)
RDW: 16.2 % — AB (ref 11.5–15.5)
WBC: 17.8 10*3/uL — ABNORMAL HIGH (ref 4.0–10.5)

## 2014-06-02 LAB — COMPREHENSIVE METABOLIC PANEL
ALT: 29 U/L (ref 0–35)
ANION GAP: 12 (ref 5–15)
AST: 20 U/L (ref 0–37)
Albumin: 3.8 g/dL (ref 3.5–5.2)
Alkaline Phosphatase: 92 U/L (ref 39–117)
BUN: 14 mg/dL (ref 6–23)
CO2: 25 mEq/L (ref 19–32)
Calcium: 9.3 mg/dL (ref 8.4–10.5)
Chloride: 104 mEq/L (ref 96–112)
Creatinine, Ser: 0.82 mg/dL (ref 0.50–1.10)
GFR calc Af Amer: >90 mL/min (ref >90)
GFR calc non Af Amer: 86 mL/min — ABNORMAL LOW (ref >90)
Glucose, Bld: 97 mg/dL (ref 70–99)
Potassium: 3.6 mEq/L — ABNORMAL LOW (ref 3.7–5.3)
Sodium: 141 mEq/L (ref 137–147)
Total Protein: 8 g/dL (ref 6.0–8.3)

## 2014-06-02 LAB — PRO B NATRIURETIC PEPTIDE: Pro B Natriuretic peptide (BNP): 319.9 pg/mL — ABNORMAL HIGH (ref 0–125)

## 2014-06-02 LAB — TROPONIN I: Troponin I: <0.3 ng/mL (ref <0.30)

## 2014-06-02 MED ORDER — IBUPROFEN 400 MG PO TABS
800.0000 mg | ORAL_TABLET | Freq: Once | ORAL | Status: AC
  Administered 2014-06-02: 800 mg via ORAL
  Filled 2014-06-02: qty 2

## 2014-06-02 MED ORDER — DOXYCYCLINE HYCLATE 100 MG PO CAPS: 100.0000 mg | ORAL_CAPSULE | Freq: Two times a day (BID) | ORAL | Status: DC

## 2014-06-02 MED ORDER — ALBUTEROL SULFATE (2.5 MG/3ML) 0.083% IN NEBU
5.0000 mg | INHALATION_SOLUTION | Freq: Once | RESPIRATORY_TRACT | Status: AC
  Administered 2014-06-02: 5 mg via RESPIRATORY_TRACT
  Filled 2014-06-02: qty 6

## 2014-06-02 MED ORDER — ALBUTEROL SULFATE HFA 108 (90 BASE) MCG/ACT IN AERS: 1.0000 | INHALATION_SPRAY | Freq: Four times a day (QID) | RESPIRATORY_TRACT | Status: DC | PRN

## 2014-06-02 NOTE — ED Provider Notes (Signed)
CSN: [id number]     Arrival date & time 06/02/14  1153 History  This chart was scribed for non-physician practitioner Trixie DredgeEmily Yahya Boldman working with No att. providers found by Carl Bestelina Holson, ED Scribe. This patient was seen in room TR05C/TR05C and the patient's care was started at 12:50 PM.     Chief Complaint  Patient presents with  . URI    Patient is a 43 y.o. female presenting with URI. The history is provided by the patient. No language interpreter was used.  URI Presenting symptoms: cough   Presenting symptoms: no congestion, no fever, no rhinorrhea and no sore throat   Associated symptoms: no myalgias    HPI Comments: Sandra Orr is a 43 y.o. female who presents to the Emergency Department complaining of constant cough productive of clear sputum and SOB that started 2-3 weeks ago.  She was in the ED 4 days ago and was diagnosed with a URI, bronchospasm, and tobacco abuse and was discharge with Albuterol and Prednisone.  She did not have a chest x-ray at that time.  She was told to take the Prednisone 3 times daily and had her last dose at 10 PM last night.  She states that her symptoms have returned since she finished her medication.  She has not taken any medication for pain.  She states that her chest and ribs hurt when she coughs.  She cannot lie flat due to SOB, cough.  She denies fever, abdominal pain, vomiting, diarrhea, urinary problems, leg swelling, myalgias, chills, rhinorrhea, congestion, and sore throat as associated symptoms.  She normally smokes half a pack of cigarettes a day but has reduced her smoking over the past 3 days due to her symptoms.  She is allergic to Penicillin.     Past Medical History  Diagnosis Date  . Depression   . Arthritis   . Obesity   . Back pain    Past Surgical History  Procedure Laterality Date  . Cholecystectomy    . Tubal ligation     Family History  Problem Relation Age of Onset  . Depression Mother     bipolar/schizo  . Heart disease  Mother   . Diabetes Mother   . Asthma Father   . Hyperlipidemia Father   . Hypertension Father   . Depression Maternal Grandmother   . Heart disease Maternal Grandmother   . Diabetes Maternal Grandmother   . Hypertension Maternal Grandfather    History  Substance Use Topics  . Smoking status: Current Every Day Smoker -- 1.00 packs/day for 20 years    Types: Cigarettes  . Smokeless tobacco: Not on file  . Alcohol Use: No   OB History   Grav Para Term Preterm Abortions TAB SAB Ect Mult Living   3 3 3       3      Review of Systems  Constitutional: Negative for fever and chills.  HENT: Negative for congestion, rhinorrhea and sore throat.   Respiratory: Positive for cough and shortness of breath.   Cardiovascular: Positive for chest pain. Negative for leg swelling.  Gastrointestinal: Negative for nausea, vomiting and abdominal pain.  Genitourinary: Negative for dysuria, urgency, frequency, hematuria, enuresis and difficulty urinating.  Musculoskeletal: Negative for myalgias.  All other systems reviewed and are negative.     Allergies  Penicillins  Home Medications   Prior to Admission medications   Medication Sig Start Date End Date Taking? Authorizing Provider  acetaminophen (TYLENOL) 500 MG tablet Take 500 mg by  mouth every 8 (eight) hours as needed.    Historical Provider, MD  guaiFENesin-dextromethorphan (ROBITUSSIN DM) 100-10 MG/5ML syrup Take 10 mLs by mouth every 4 (four) hours as needed for cough.    Historical Provider, MD  predniSONE (DELTASONE) 20 MG tablet Take 3 tablets (60 mg total) by mouth daily. 05/29/14   Shari A Upstill, PA-C  pseudoephedrine-acetaminophen (TYLENOL SINUS) 30-500 MG TABS Take 2 tablets by mouth every 4 (four) hours as needed (cough/cold symptoms).    Historical Provider, MD   BP 139/79  Pulse 62  Temp(Src) 99.3 F (37.4 C) (Oral)  Resp 22  Ht 5\' 4"  (1.626 m)  Wt 374 lb (169.645 kg)  BMI 64.17 kg/m2  SpO2 97% Physical Exam  Nursing  note and vitals reviewed. Constitutional: She appears well-developed and well-nourished. No distress.  HENT:  Head: Normocephalic and atraumatic.  Mouth/Throat: Oropharynx is clear and moist. No oropharyngeal exudate.  Eyes: Conjunctivae are normal.  Neck: Neck supple.  Cardiovascular: Normal rate and regular rhythm.   Pulmonary/Chest: Effort normal and breath sounds normal. No respiratory distress. She has no wheezes. She has no rales.  Coarse breath sounds throughout.  Musculoskeletal: She exhibits no edema.  Neurological: She is alert.  Skin: She is not diaphoretic.  Psychiatric: She has a normal mood and affect. Her behavior is normal.    ED Course  Procedures (including critical care time)  DIAGNOSTIC STUDIES: Oxygen Saturation is 97% on room airr, adequate by my interpretation.    COORDINATION OF CARE: 12:53 PM-Will obtain a chest x-ray to rule out pneumonia.  The patient agreed to the treatment plan.   Labs Review Labs Reviewed  CBC WITH DIFFERENTIAL  COMPREHENSIVE METABOLIC PANEL  TROPONIN I  PRO B NATRIURETIC PEPTIDE    Imaging Review Dg Chest 2 View  06/02/2014   CLINICAL DATA:  Productive cough and midsternal chest tightness for 2-3 weeks. Smoker.  EXAM: CHEST  2 VIEW  COMPARISON:  Single view of the chest 07/08/2013.  FINDINGS: Lung volumes are low with crowding of the bronchovascular structures. There is cardiomegaly and vascular congestion. No consolidative process, pneumothorax or effusion.  IMPRESSION: Cardiomegaly and vascular congestion.  No acute process.   Electronically Signed   By: Drusilla Kannerhomas  Dalessio M.D.   On: 06/02/2014 15:16     EKG Interpretation None      3:38 PM Discussed findings with patient.  Concern that this might be heart failure rather than acute respiratory illness.  Pt to be moved to back for further cardiac workup.   MDM   Final diagnoses:  None    Pt with cough productive of clear sputum, SOB, chest pain, ongoing x 2-3 weeks.   Mild temporary improvement with 3 days of steroids and albuterol.  CXR shows cardiomegaly and vascular congestion.  Pt has no known cardiac history.  Moved to main ED from fast track for cardiac workup and cardiac monitoring.  Discussed pt with Dr Romeo AppleHarrison who assumes care of patient.   I personally performed the services described in this documentation, which was scribed in my presence. The recorded information has been reviewed and is accurate.    Trixie Dredgemily Kai Calico, PA-C 06/02/14 [phone]

## 2014-06-02 NOTE — ED Notes (Signed)
Pt presents to ED c/o worsening URI symptoms. Pt states she was seen earlier this week and was prescribed Prednisone and Albuterol for symptom management. Pt states she ran out of these meds last night and has now begun to feel worse. Denies CP.

## 2014-06-02 NOTE — ED Notes (Signed)
Pt returned from radiology.

## 2014-06-02 NOTE — ED Provider Notes (Signed)
Medical screening examination/treatment/procedure(s) were conducted as a shared visit with non-physician practitioner(s) and myself.  I personally evaluated the patient during the encounter.   EKG Interpretation   Date/Time:  Saturday June 02 2014 16:29:02 EDT Ventricular Rate:  59 PR Interval:  143 QRS Duration: 85 QT Interval:  416 QTC Calculation: 412 R Axis:   66 Text Interpretation:  Sinus rhythm Low voltage, precordial leads No  significant change since last tracing Confirmed by Garlin Batdorf  MD, Rudine Rieger  ([id number]) on 06/02/2014 4:32:24 PM       I interviewed and examined the patient. Lungs w/ mild coarse lung sounds. Cardiac exam wnl. Abdomen soft. No LE edema noted. Pt w/ sob, cough, orthopnea for several weeks. Chest wall pain and also intermittent sharp central cp lasting a few min at a time since cough began. Denies this currently on exam. No pleuritic pain or pain w/ breathing. Wells/perc neg. Pt did have cardiomegaly on CXR and PA preferred pt move back to main ED bed for lab workup which is reasonable. She does have f/u w/ a pcp on the 26th. VS unremarkable.   5:44 PM: Pt has elev wbc, other labs non-contrib. Given change in sputum color and inc wbc will cover for CAP w/ doxy. I suspect her cp sx are likely related to the cough as they began w/ the cough. I have discussed the diagnosis/risks/treatment options with the patient and believe the pt to be eligible for discharge home to follow-up with her pcp as scheduled on the 26th of this month. We also discussed returning to the ED immediately if new or worsening sx occur. We discussed the sx which are most concerning (e.g., worsening sob, worsening pain, fever) that necessitate immediate return. Medications administered to the patient during their visit and any new prescriptions provided to the patient are listed below.  Medications given during this visit Medications  albuterol (PROVENTIL) (2.5 MG/3ML) 0.083% nebulizer solution 5  mg (5 mg Nebulization Given 06/02/14 1302)  ibuprofen (ADVIL,MOTRIN) tablet 800 mg (800 mg Oral Given 06/02/14 1302)    New Prescriptions   ALBUTEROL (PROVENTIL HFA;VENTOLIN HFA) 108 (90 BASE) MCG/ACT INHALER    Inhale 1-2 puffs into the lungs every 6 (six) hours as needed for wheezing or shortness of breath.   DOXYCYCLINE (VIBRAMYCIN) 100 MG CAPSULE    Take 1 capsule (100 mg total) by mouth 2 (two) times daily. One po bid x 7 days     Purvis SheffieldForrest Zell Doucette, MD 06/02/14 2258

## 2014-06-02 NOTE — ED Notes (Signed)
Discharge instructions reviewed with pt. Pt verbalized understanding.   

## 2014-06-03 NOTE — ED Provider Notes (Signed)
Medical screening examination/treatment/procedure(s) were performed by non-physician practitioner and as supervising physician I was immediately available for consultation/collaboration.   Sandra Orr Sandra Rybolt, MD 06/03/14 [phone]

## 2014-06-18 ENCOUNTER — Encounter (HOSPITAL_COMMUNITY): Payer: Self-pay | Admitting: Emergency Medicine

## 2014-07-23 ENCOUNTER — Other Ambulatory Visit (HOSPITAL_COMMUNITY): Payer: Self-pay | Admitting: Primary Care

## 2014-08-07 ENCOUNTER — Other Ambulatory Visit: Payer: Self-pay | Admitting: Obstetrics and Gynecology

## 2014-08-07 DIAGNOSIS — Z1231 Encounter for screening mammogram for malignant neoplasm of breast: Secondary | ICD-10-CM

## 2014-08-30 ENCOUNTER — Ambulatory Visit (HOSPITAL_COMMUNITY): Payer: Self-pay

## 2014-09-20 ENCOUNTER — Ambulatory Visit (HOSPITAL_COMMUNITY)
Admission: RE | Admit: 2014-09-20 | Discharge: 2014-09-20 | Disposition: A | Discharge status: Home / Self Care | Payer: Self-pay | Source: Ambulatory Visit | Attending: Obstetrics and Gynecology | Admitting: Obstetrics and Gynecology

## 2014-09-20 ENCOUNTER — Encounter (HOSPITAL_COMMUNITY): Payer: Self-pay

## 2014-09-20 VITALS — BP 126/84 | Temp 98.0°F | Ht 64.0 in | Wt 384.0 lb

## 2014-09-20 DIAGNOSIS — Z1239 Encounter for other screening for malignant neoplasm of breast: Secondary | ICD-10-CM

## 2014-09-20 DIAGNOSIS — Z1231 Encounter for screening mammogram for malignant neoplasm of breast: Secondary | ICD-10-CM

## 2014-09-20 NOTE — Progress Notes (Signed)
No complaints today.  Pap Smear:  Pap smear not completed today. [facility] and normal. Per patient has no history of an abnormal Pap smear. Last Pap smear result is in EPIC.  Physical exam: Breasts Breasts symmetrical. No skin abnormalities bilateral breasts. No nipple retraction right breast. Left nipple inverted per patient is normal for her. No nipple discharge bilateral breasts. No lymphadenopathy. No lumps palpated bilateral breasts. No complaints of pain or tenderness on exam. Patient escorted to mammography for a screening mammogram.  Pelvic/Bimanual No Pap smear completed today since last Pap smear was 08/22/2013. Pap smear not indicated per [facility] guidelines.   Smoking cessation discussed with patient. Referred patient to the [facility] and gave resources to free classes offered [facility] the [facility].

## 2014-09-20 NOTE — Patient Instructions (Addendum)
Explained to Sandra Orr that she did not need a Pap smear today due to last Pap smear was 08/22/2013. Let her know [facility] will cover Pap smears every 3 years unless has a history of abnormal Pap smears. Let patient know the [facility] will follow up with her within the next couple weeks with results by letter or phone. Smoking cessation discussed with patient and resources given. Sandra AugustVeronica A Barkalow verbalized understanding. Patient escorted to mammography for a screening mammogram.  Elihue Ebert, Kathaleen Maserhristine Poll, RN 9:28 AM

## 2015-01-10 ENCOUNTER — Ambulatory Visit: Payer: Self-pay

## 2015-01-11 ENCOUNTER — Other Ambulatory Visit: Payer: Self-pay

## 2015-01-11 ENCOUNTER — Ambulatory Visit (HOSPITAL_BASED_OUTPATIENT_CLINIC_OR_DEPARTMENT_OTHER): Payer: Self-pay

## 2015-01-11 VITALS — BP 138/88 | HR 78 | Temp 98.4°F | Resp 26 | Ht 65.5 in | Wt 384.8 lb

## 2015-01-11 DIAGNOSIS — Z0000 Encounter for general adult medical examination without abnormal findings: Secondary | ICD-10-CM

## 2015-01-11 LAB — HEMOGLOBIN A1C
Hgb A1c MFr Bld: 6.5 % — ABNORMAL HIGH (ref <5.7)
Mean Plasma Glucose: 140 mg/dL — ABNORMAL HIGH (ref <117)

## 2015-01-11 LAB — GLUCOSE (CC13): Glucose: 101 mg/dl (ref 70–140)

## 2015-01-11 LAB — LIPID PANEL
CHOL/HDL RATIO: 4.3 ratio
CHOLESTEROL: 164 mg/dL (ref 0–200)
HDL: 38 mg/dL — AB (ref >=46)
LDL CALC: 113 mg/dL — AB (ref 0–99)
TRIGLYCERIDES: 67 mg/dL (ref <150)
VLDL: 13 mg/dL (ref 0–40)

## 2015-01-11 NOTE — Patient Instructions (Signed)
Discussed health assessment with patient. Talked with patient about smoking cessation and gave resources. Will decide on programs  When finds transportation. Let patient know that will call her on Tuesday with lab results and [facility] appointment [facility] if needed. Informed not to let Health and wellness do any labs or procedures until goes to eligibility and get orange card or patient assistance. Patient verbalized understanding.

## 2015-01-11 NOTE — Progress Notes (Signed)
Patient is a new patient to the [facility] program and is currently a [facility] patient effective 09/20/2014 r.   Clinical Measurements: Patient is 5 ft. 5 1/2 inches, weight 384.8 lbs, waist circumference 60 inches, and hip circumference 63 inches.   Medical History: Patient has no history of high cholesterol. Patient does not have a history of hypertension or diabetes. Per patient no diagnosed history of coronary heart disease, heart attack,  stroke/TIA, vascular disease or congenital heart defects. Patient states that has a history of Congested heart failure.  Blood Pressure, Self-measurement: Patient states has no reason to check Blood pressure.  Nutrition Assessment: Patient stated that eats 0 fruits every day. Patient states she eats 0 servings of vegetables a day. Patient states does not feel like needs food on most days. Per patient states does not eat 3 or more ounces of whole grains daily. Patient stated doesn't eat two or more servings of fish weekly. Patient states she does drink more than 36 ounces or 450 calories of beverages with added sugars weekly. Patient stated she does not watch her salt intake. Marland Kitchen.  Physical Activity Assessment: Patient stated that cleans 1 hours a day. Patient states that gets short of breath if does too much or walks far. Patient does around 420 minutes of moderate exercise a week and no vigorous exercise.  Smoking Status: Patient smokes a pack a day and smokes in her home. Patient states that wants to quit smoking. Discussed smoking classes but patient stated that had a transportation problem.  Quality of Life Assessment: In assessing patient's quality of life she stated that out of the past 30 days that she has felt her health was not good for 15 to 20 days. Patient also stated that in the past 30 days that her mental health was good including stress, depression and problems with emotions for all days. Patient did state that out of the past 30 days she felt her  physical or mental health had kept her from doing her usual activities including self-care, work or recreation on some of the 15 to 20 days when has physical problems..   Plan: Lab work will be done today including a lipid panel, blood glucose, and Hgb A1C. Will call lab results when they are finished. Will discuss risk reduction counseling when call results.Plan to look into [employer] for transportation.

## 2015-01-25 ENCOUNTER — Telehealth: Payer: Self-pay

## 2015-01-25 NOTE — Telephone Encounter (Signed)
Called patient to inform about doctor appointment concerning Hgb A1C -  6.5, HDL - 36, LDL - 114, and BMI - 63.1. Told patient that she had an appointment with Dr. Wende Mott at [facility] on June 20 at 9 AM. Explain to patient to not let practice take more lab work or do procedures unless an emergency until goes for eligibility for [facility] Patient Assistance or [employer].

## 2015-01-31 ENCOUNTER — Ambulatory Visit: Payer: Self-pay

## 2015-01-31 DIAGNOSIS — Z789 Other specified health status: Secondary | ICD-10-CM

## 2015-01-31 NOTE — Progress Notes (Signed)
Patient returns today for Health Coaching regarding Nutrition for her borderline AIC for diabetes, HDL's, LDL's, and activity.  NUTRITION:  Reviewed patient lab results, BP,and other numbers. Made goals to loose weight (first 10% of body weight), eat healthier, eat three meals per day, decrease Hgb A1C to below 5.7 Patient and increase activity. Patient  viewed BMI and weight chart to see that she is considered extremely obese. Patient reviewed A1C handout to see about prediabetes, normal and  diabetes range. Patient went over what complications that can result if do not get levels to normal, and diabetes level. Discussed increasing fiber in diet, reading labels and measuring serving sizes. Discussed watching carbohydrates, how to count them, serving sizes and number of carbs per day allowance. Discussed changing over to artificial sweeteners. Discussed the importance of increasing fiber in diet. Patient went over 1800 and 2000 calorie meal plan and we broke it down to the number of servings she could have. Per patient is not hungry and when asked what was eating in normal 24 hour period. Wrote down what should be eating a day and why it is important to eat 3 to 5 small meals a day. Found out that patient is not eating enough. Patient received and reviewed the following handouts in : Make half your grains whole, Diabetes Meal Plan Basics, Be Serving Wise, 8 ways to improve your cholesterol, signs and symptom of diabetes, weight/BMI Chart, foot care, label reading and 1800 and 2000 calorie meal plans. Patient voiced did not have resources for traveling and eating. Per patient the only source of income is food stamps. Discussed how could eat in the community free. Patient received "The Little Blue Book", area food banks and "The Little Green Book", place you can eat for free in the surrounding area.  SMOKING: Plan to go to community program or individual program and wants to attend Diabetic program.   ACTIVITY:  Discussed activity and walking. Patient stated had been looking for gym and would like to go. Patient and I discussed starting with a smaller goal of walking and increasing the amount of walking per day. To do some of ways to do exercises in [place] Activity book.Patient and I will discuss gyms and other ideas in the future.  Miscellaneous: Discussed not letting doctor's office give shots or draw more blood.Patient has appointment at [facility] on June 20.  PLAN: Patient will increase amount of walking per day and add other exercises to plan, Decrease carbohydrates in diet. Lose weight. Control portion sizes. Eat at least three meals a day. Follow up times three per phone unless needs and can call or come in for more Health Coaching. Will make sure A1C is repeated in 3 to 6 months either at doctor's office or Cancer Center.

## 2015-01-31 NOTE — Patient Instructions (Addendum)
Patient will follow 2000 or 1800 calorie meal plan. Will review all handouts and exercise/activity book. Will increase exercise and walking. Will measure portion sizes. Quit smoking. Attend diabetic classes. Will call if has any questions. Will call patient in three weeks. Patient verbalized understanding.

## 2015-02-04 ENCOUNTER — Ambulatory Visit (INDEPENDENT_AMBULATORY_CARE_PROVIDER_SITE_OTHER): Payer: Self-pay | Admitting: Family Medicine

## 2015-02-04 ENCOUNTER — Encounter: Payer: Self-pay | Admitting: Family Medicine

## 2015-02-04 VITALS — BP 146/80 | HR 78 | Temp 98.3°F | Ht 66.0 in | Wt 383.0 lb

## 2015-02-04 DIAGNOSIS — E1165 Type 2 diabetes mellitus with hyperglycemia: Secondary | ICD-10-CM

## 2015-02-04 DIAGNOSIS — E119 Type 2 diabetes mellitus without complications: Secondary | ICD-10-CM | POA: Insufficient documentation

## 2015-02-04 HISTORY — DX: Type 2 diabetes mellitus without complications: E11.9

## 2015-02-04 NOTE — Progress Notes (Signed)
Mickie Hillier, MD, MS Phone: [phone]  Subjective:  Pt Here for diabetes referral from [facility] Patient was recently found to have an elevated A1c to 6.5. This technically places her in the category of diabetes. She was referred to our practice for medical management, however she is typically seen by [facility], and informed our nursing staff that she plans to continue seeing her physician there.   Patient c/o recent intermittent vision changes of blurriness which is not associated w/ dizziness or lightheadedness. This symptom does not last long, usually ~64min. She also has nocturia, getting up ~4x every night to urinate. She also endorses polydipsia over the past ~72mths. She was usually drinking soda when thirsty, but since the topic of DM has been brought up she says she has tried to drink water instead.   DIABETES Type II Medication Compliance: no medication prescribed at this time. Medication Side Effects: n/a Blood Sugar Ranges: unknown Symptoms of Hypoglycemia: none  Counseling  Diet pattern: "all the wrong things". Has discussed this w/ the wise women program.   Exercise: little to no exercise. Would like to start walking and swimming.  Smoking status: yes; 1pack/day; 30 packyear hx  Comorbid Symptoms: Negative Chest pain; Positive SOB; negative Neuropathy. ositive Vision problems  Health Maintenance Due  Topic Date Due  . PNEUMOCOCCAL POLYSACCHARIDE VACCINE (1) 12/02/1972  . FOOT EXAM  12/02/1980  . OPHTHALMOLOGY EXAM  12/02/1980  . URINE MICROALBUMIN  12/02/1980  . HIV Screening  12/02/1985  . TETANUS/TDAP  12/02/1989    ROS- Endorses Polyuria,Polydipsia, nocturia, Vision changes, feet or hand numbness/pain/tingling. Denies Hypoglycemia symptoms (shaky, sweaty, hungry, weak anxious, tremor, palpitations, confusion, behavior change).   Hemoglobin a1c:  Lab Results  Component Value Date   HGBA1C 6.5* 01/11/2015    Past Medical  History Patient Active Problem List   Diagnosis Date Noted  . DM type 2 (diabetes mellitus, type 2) 02/04/2015  . Morbid obesity 05/04/2012  . Depression 05/04/2012  . Chronic pain 05/04/2012  . Tobacco abuse 05/04/2012    Medications- reviewed and updated Current Outpatient Prescriptions  Medication Sig Dispense Refill  . acetaminophen (TYLENOL) 500 MG tablet Take 500 mg by mouth every 8 (eight) hours as needed.    Marland Kitchen albuterol (PROVENTIL HFA;VENTOLIN HFA) 108 (90 BASE) MCG/ACT inhaler Inhale 1-2 puffs into the lungs every 6 (six) hours as needed for wheezing or shortness of breath. 1 Inhaler 0  . doxycycline (VIBRAMYCIN) 100 MG capsule Take 1 capsule (100 mg total) by mouth 2 (two) times daily. One po bid x 7 days (Patient not taking: Reported on 09/20/2014) 14 capsule 0   No current facility-administered medications for this visit.    Objective: BP 146/80 mmHg  Pulse 78  Temp(Src) 98.3 F (36.8 C) (Oral)  Ht  (1.676 m)  Wt 383 lb (173.728 kg)  BMI 61.85 kg/m2  LMP 01/29/2015 (Approximate) Gen: NAD, alert, cooperative with exam and pleasant affect. HEENT: NCAT, EOMI, PERRL CV: RRR, no murmur Resp: CTABL, no wheezes, non-labored Abd: Obese, Soft, Non Tender, Non Distended, BS present, no guarding or organomegaly Ext: No edema, warm Neuro: Alert and oriented, No gross deficits   Assessment/Plan:  DM type 2 (diabetes mellitus, type 2) Patient symptoms and labs c/w newly diagnosed DM2. Patient referred here for management, but states her desire to continue seeing physicians at [facility]. I believe the primary purpose of today was not to establish care in our clinic but to be seen by  Abundio Miu to be set up for the [employer].   Because of this I will not initiate any medical management or obtain any labs in response to her newly diagnosed DM2. Patient will likely need to begin Metformin. We discussed the side effects of this medication. We also  discussed the need to loose weight, whish should greatly benefit her DM and her HTN. It is important for a patient like this to establish goals. Goals which are achievable (almost easily achievable) would likely have a strong impact on her motivation. Setting specific goals for weight loss w/ a set start and end date and a targeted weight loss will likely be the ideal route to go.     Kathee Delton, MD,MS,  PGY1 02/04/2015 4:30 PM

## 2015-02-04 NOTE — Patient Instructions (Signed)
It was a pleasure seeing you today in our clinic. Today we discussed Diabetes and weight control. Here is the treatment plan we have discussed and agreed upon together:   - Please follow up with your provider at [facility] - We have already discussed the pros and cons of starting a medication for your blood sugar control; if they think this is necessary, then they will likely start this at your next visit. - Stay motivated! Setting goals are very important. Be SPECIFIC! Don't just make a goal to "lose weight" -- you need to know how much weight to lose and to set a date!

## 2015-02-04 NOTE — Assessment & Plan Note (Signed)
Patient symptoms and labs c/w newly diagnosed DM2. Patient referred here for management, but states her desire to continue seeing physicians at [facility]. I believe the primary purpose of today was not to establish care in our clinic but to be seen by Abundio Miu to be set up for the [facility].   Because of this I will not initiate any medical management or obtain any labs in response to her newly diagnosed DM2. Patient will likely need to begin Metformin. We discussed the side effects of this medication. We also discussed the need to loose weight, whish should greatly benefit her DM and her HTN. It is important for a patient like this to establish goals. Goals which are achievable (almost easily achievable) would likely have a strong impact on her motivation. Setting specific goals for weight loss w/ a set start and end date and a targeted weight loss will likely be the ideal route to go.

## 2015-04-18 ENCOUNTER — Telehealth: Payer: Self-pay

## 2015-04-18 NOTE — Telephone Encounter (Signed)
This is a follow up call for second Health Coaching using [facility]. Patient has not had A1C checked since 01/11/15. Patient stated that had been exercising and eating better. Per patient states that clothes are looser, so feels is loosing weight. When questioned patient stated was going back to [facility] and not back to [facility]. Patient stated had made appointment with Family Medicine. I noted that appointment is for 04/26/15. Patient stated that had received a bill. I gave patient address of [facility] to send bill and we discussed about eligibility.(15 minutes).

## 2015-04-26 ENCOUNTER — Ambulatory Visit: Payer: Self-pay | Admitting: Family Medicine

## 2015-05-30 ENCOUNTER — Ambulatory Visit (INDEPENDENT_AMBULATORY_CARE_PROVIDER_SITE_OTHER): Payer: Self-pay | Admitting: Family Medicine

## 2015-05-30 ENCOUNTER — Encounter: Payer: Self-pay | Admitting: Family Medicine

## 2015-05-30 VITALS — BP 140/87 | HR 60 | Temp 98.3°F | Ht 66.0 in | Wt 373.0 lb

## 2015-05-30 DIAGNOSIS — Z5900 Homelessness unspecified: Secondary | ICD-10-CM | POA: Insufficient documentation

## 2015-05-30 DIAGNOSIS — Z720 Tobacco use: Secondary | ICD-10-CM

## 2015-05-30 DIAGNOSIS — I10 Essential (primary) hypertension: Secondary | ICD-10-CM

## 2015-05-30 DIAGNOSIS — E1165 Type 2 diabetes mellitus with hyperglycemia: Secondary | ICD-10-CM

## 2015-05-30 LAB — POCT GLYCOSYLATED HEMOGLOBIN (HGB A1C): Hemoglobin A1C: 6

## 2015-05-30 NOTE — Assessment & Plan Note (Signed)
Social work consultation was placed in hopes to find additional resources available for this patient.

## 2015-05-30 NOTE — Assessment & Plan Note (Signed)
Patient's blood pressure was slightly hypertensive today. However looking back at our previous visit it seems somewhat improved. I believe that as patient attempts to lose weight her blood pressure is likely to improve as well.  - At this time I will not treat this blood pressure. - If patient continues to have hypertensive values in our next visits I will likely begin low-dose ACEi therapy. However, my hope is that weight loss will correct this.

## 2015-05-30 NOTE — Progress Notes (Signed)
HPI  CC: Diabetes check Patient is here for a diabetes check and for a initial visit of left wrist pain.  Patient states that she has been attempting to watch what she eats and has been more active since her last visit. Unfortunately one of the reasons why she has been more active is because she is now homeless and she frequently changes housing. She is currently looking for stable employment. She does endorse the fact that she feels is that she has lost some weight since our last visit.  Her left wrist pain is primarily paresthesias of the second and fourth digits. Symptom onset was approximately one month ago and she states that if her arm hangs to her side for too long and she begins to feel her fingers tingling. She denies any overuse or activities causing repetitive motions. She denies any weakness in these fingers or the hand. No swelling, erythema, or injury to this extremity. At times she is able to "shake it out".  ROS: She denies any lightheadedness, confusion, dizziness, shortness of breath, cold sweats, polyuria, polydipsia, weakness, decreased range of motion, swelling, erythema, injury.  Past medical history and social history reviewed and updated in the EMR as appropriate.  Objective: BP 140/87 mmHg  Pulse 60  Temp(Src) 98.3 F (36.8 C) (Oral)  Ht  (1.676 m)  Wt 373 lb (169.192 kg)  BMI 60.23 kg/m2 Gen: NAD, alert, cooperative, and pleasant. Morbidly obese HEENT: NCAT, EOMI, PERRL CV: RRR Resp: CTAB, no wheezes, non-labored Abd: SNTND, obese, BS present, no guarding or organomegaly Ext: Left wrist/hand: Full range of motion, no erythema or ecchymoses, strength preserved throughout, no thenar atrophy, positive Phelan's test. Neuro: Alert and oriented, Speech clear, No gross deficits. DTRs symmetric throughout  Assessment and plan:  DM type 2 (diabetes mellitus, type 2) Patient had a borderline diabetic A1c of 6.5 on 01/09/15. Today her A1c is down to 6.0, placing  her in the prediabetic criteria. This reduction in A1c was done without the use of any medications. Patient's weight is down from 383 to 373. Patient states that she has been more active and has been eating a little bit better. I believe that this weight loss (or the increased activity that caused it) has reduced her A1c into the prediabetic range. I do not believe that this patient requires any medical management at this time for her diabetes. - Patient and I had a long discussion about healthy eating habits and weight loss goals. We set a goal of losing 5 pounds before our next visit with a long-term goal of averaging 1-2 pounds of weight loss each month. I believe that it is imperative that we keep our weight loss goals achievable as patient will require long-term motivation to achieve a healthier BMI.  Homelessness Social work consultation was placed in hopes to find additional resources available for this patient.  Tobacco abuse Tobacco cessation help line information was provided to the patient today per her request.  Essential hypertension, benign Patient's blood pressure was slightly hypertensive today. However looking back at our previous visit it seems somewhat improved. I believe that as patient attempts to lose weight her blood pressure is likely to improve as well.  - At this time I will not treat this blood pressure. - If patient continues to have hypertensive values in our next visits I will likely begin low-dose ACEi therapy. However, my hope is that weight loss will correct this.    Orders Placed This Encounter  Procedures  .  Ambulatory referral to Social Work    Referral Priority:  Routine    Referral Type:  Consultation    Referral Reason:  Specialty Services Required    Number of Visits Requested:  1  . POCT glycosylated hemoglobin (Hb A1C)     Kathee DeltonIan D Defne Gerling, MD,MS,  PGY2 05/30/2015 6:12 PM

## 2015-05-30 NOTE — Assessment & Plan Note (Signed)
Tobacco cessation help line information was provided to the patient today per her request.

## 2015-05-30 NOTE — Patient Instructions (Signed)
It was a pleasure seeing you today in our clinic. Today we discussed your blood sugars and left wrist pain. Here is the treatment plan we have discussed and agreed upon together:   - I have placed a referral to Social Work for any additional resources that may be available to you. - I have given you a Tobacco Quit Line card. I ask that you call them for any help/resources for nicotine replacement therapy - I believe the pain you have in your left hand is likely caused by "carpal tunnel syndrome". Perform the exercises we discussed in the clinic 2 times a day. Take ibuprofen or tylenol for pain. Ice this area regularly.    Carpal Tunnel Syndrome Carpal tunnel syndrome is a condition that causes pain in your hand and arm. The carpal tunnel is a narrow area located on the palm side of your wrist. Repeated wrist motion or certain diseases may cause swelling within the tunnel. This swelling pinches the main nerve in the wrist (median nerve). CAUSES  This condition may be caused by:   Repeated wrist motions.  Wrist injuries.  Arthritis.  A cyst or tumor in the carpal tunnel.  Fluid buildup during pregnancy. Sometimes the cause of this condition is not known.  RISK FACTORS This condition is more likely to develop in:   People who have jobs that cause them to repeatedly move their wrists in the same motion, such as butchers and cashiers.  Women.  People with certain conditions, such as:  Diabetes.  Obesity.  An underactive thyroid (hypothyroidism).  Kidney failure. SYMPTOMS  Symptoms of this condition include:   A tingling feeling in your fingers, especially in your thumb, index, and middle fingers.  Tingling or numbness in your hand.  An aching feeling in your entire arm, especially when your wrist and elbow are bent for long periods of time.  Wrist pain that goes up your arm to your shoulder.  Pain that goes down into your palm or fingers.  A weak feeling in your hands.  You may have trouble grabbing and holding items. Your symptoms may feel worse during the night.  DIAGNOSIS  This condition is diagnosed with a medical history and physical exam. You may also have tests, including:   An electromyogram (EMG). This test measures electrical signals sent by your nerves into the muscles.  X-rays. TREATMENT  Treatment for this condition includes:  Lifestyle changes. It is important to stop doing or modify the activity that caused your condition.  Physical or occupational therapy.  Medicines for pain and inflammation. This may include medicine that is injected into your wrist.  A wrist splint.  Surgery. HOME CARE INSTRUCTIONS  If You Have a Splint:  Wear it as told by your health care provider. Remove it only as told by your health care provider.  Loosen the splint if your fingers become numb and tingle, or if they turn cold and blue.  Keep the splint clean and dry. General Instructions  Take over-the-counter and prescription medicines only as told by your health care provider.  Rest your wrist from any activity that may be causing your pain. If your condition is work related, talk to your employer about changes that can be made, such as getting a wrist pad to use while typing.  If directed, apply ice to the painful area:  Put ice in a plastic bag.  Place a towel between your skin and the bag.  Leave the ice on for 20 minutes, 2-3  times per day.  Keep all follow-up visits as told by your health care provider. This is important.  Do any exercises as told by your health care provider, physical therapist, or occupational therapist. SEEK MEDICAL CARE IF:   You have new symptoms.  Your pain is not controlled with medicines.  Your symptoms get worse.   This information is not intended to replace advice given to you by your health care provider. Make sure you discuss any questions you have with your health care provider.   Document Released:  07/31/2000 Document Revised: 04/24/2015 Document Reviewed: 12/19/2014 Elsevier Interactive Patient Education [employer]2016 Elsevier Inc.

## 2015-05-30 NOTE — Assessment & Plan Note (Signed)
Patient had a borderline diabetic A1c of 6.5 on 01/09/15. Today her A1c is down to 6.0, placing her in the prediabetic criteria. This reduction in A1c was done without the use of any medications. Patient's weight is down from 383 to 373. Patient states that she has been more active and has been eating a little bit better. I believe that this weight loss (or the increased activity that caused it) has reduced her A1c into the prediabetic range. I do not believe that this patient requires any medical management at this time for her diabetes. - Patient and I had a long discussion about healthy eating habits and weight loss goals. We set a goal of losing 5 pounds before our next visit with a long-term goal of averaging 1-2 pounds of weight loss each month. I believe that it is imperative that we keep our weight loss goals achievable as patient will require long-term motivation to achieve a healthier BMI.

## 2015-05-31 ENCOUNTER — Encounter: Payer: Self-pay | Admitting: *Deleted

## 2015-05-31 NOTE — Progress Notes (Signed)
[facility] Clinical Social Work Covering for [facility]  Pt was referred by [facility] to CSW for assessment of psychosocial needs due to homelessness.  Clinical Social Worker contacted patient via phone to offer support and assess for needs.  Pt reports she has been staying with friends and family for the last 4-5 years. She reports she can stay with friends, cousins and her father. She states she moves when "attitudes get out of control".   Pt reports she receives food stamps of $186 per month and [facility]. She has been re-exploring ss disability as she reports she cannot work due to her pain and health concerns. She has applied in the past, but was denied. CSW reviewed local resources to assist, such as the [facility], [employer] and [employer]. Pt reports she gets family to take her in between the various homes where she stays. Pt open to exploring these resources and CSW is mailing her a list of these with a bus pass to explore these further. Pt reports she is currently staying with friend in [place] and has safe places to stay.   Clinical Social Work interventions: Resource education  Sandra Orr, [place] Clinical Social Worker [facility]  [facility] Phone: [phone] Fax: [phone]

## 2015-06-05 ENCOUNTER — Telehealth: Payer: Self-pay

## 2015-06-05 NOTE — Telephone Encounter (Signed)
Called for follow up Health Coaching. Left message for patient to return my phone call.

## 2015-06-06 ENCOUNTER — Telehealth: Payer: Self-pay

## 2015-06-06 NOTE — Telephone Encounter (Signed)
Patient returned call. Per patient has lost 20 pounds and cut back on smoking. Stated she was eating right, exercising and staying busy. When ask what was main goal to continue working on was weight loss and quitting smoking. Patient stated that was depressed and had a lot going on around her. Asked about what foresee's happening patient stated that planned on continuing eating and exercising more. I praise patient for choices and actions that has taken. Discussed and praised for Decreasing Hbg A1C to 6 which is difference of .5 and patient is now in pre diabetes range. Informed patient to call if needed to talk or needed some direction on a problem area. (3rd [id number])

## 2015-06-26 ENCOUNTER — Encounter (HOSPITAL_COMMUNITY): Payer: Self-pay

## 2015-06-26 ENCOUNTER — Emergency Department (HOSPITAL_COMMUNITY)
Admission: EM | Admit: 2015-06-26 | Discharge: 2015-06-26 | Disposition: A | Discharge status: Home / Self Care | Payer: Self-pay | Attending: Emergency Medicine | Admitting: Emergency Medicine

## 2015-06-26 ENCOUNTER — Emergency Department (HOSPITAL_COMMUNITY): Payer: Self-pay

## 2015-06-26 DIAGNOSIS — Z8659 Personal history of other mental and behavioral disorders: Secondary | ICD-10-CM | POA: Insufficient documentation

## 2015-06-26 DIAGNOSIS — Z880 Allergy status to penicillin: Secondary | ICD-10-CM | POA: Insufficient documentation

## 2015-06-26 DIAGNOSIS — E669 Obesity, unspecified: Secondary | ICD-10-CM | POA: Insufficient documentation

## 2015-06-26 DIAGNOSIS — Z79899 Other long term (current) drug therapy: Secondary | ICD-10-CM | POA: Insufficient documentation

## 2015-06-26 DIAGNOSIS — G5602 Carpal tunnel syndrome, left upper limb: Secondary | ICD-10-CM | POA: Insufficient documentation

## 2015-06-26 DIAGNOSIS — M1712 Unilateral primary osteoarthritis, left knee: Secondary | ICD-10-CM | POA: Insufficient documentation

## 2015-06-26 DIAGNOSIS — Z720 Tobacco use: Secondary | ICD-10-CM | POA: Insufficient documentation

## 2015-06-26 MED ORDER — DICLOFENAC SODIUM 75 MG PO TBEC: 75.0000 mg | DELAYED_RELEASE_TABLET | Freq: Two times a day (BID) | ORAL | Status: DC

## 2015-06-26 MED ORDER — TRAMADOL HCL 50 MG PO TABS: 50.0000 mg | ORAL_TABLET | Freq: Four times a day (QID) | ORAL | Status: DC | PRN

## 2015-06-26 NOTE — Discharge Instructions (Signed)
Carpal Tunnel Syndrome Carpal tunnel syndrome is a condition that causes pain in your hand and arm. The carpal tunnel is a narrow area that is on the palm side of your wrist. Repeated wrist motion or certain diseases may cause swelling in the tunnel. This swelling can pinch the main nerve in the wrist (median nerve).  HOME CARE If You Have a Splint:  Wear it as told by your doctor. Remove it only as told by your doctor.  Loosen the splint if your fingers:  Become numb and tingle.  Turn blue and cold.  Keep the splint clean and dry. General Instructions  Take over-the-counter and prescription medicines only as told by your doctor.  Rest your wrist from any activity that may be causing your pain. If needed, talk to your employer about changes that can be made in your work, such as getting a wrist pad to use while typing.  If directed, apply ice to the painful area:  Put ice in a plastic bag.  Place a towel between your skin and the bag.  Leave the ice on for 20 minutes, 2-3 times per day.  Keep all follow-up visits as told by your doctor. This is important.  Do any exercises as told by your doctor, physical therapist, or occupational therapist. GET HELP IF:  You have new symptoms.  Medicine does not help your pain.  Your symptoms get worse.   This information is not intended to replace advice given to you by your health care provider. Make sure you discuss any questions you have with your health care provider.   Document Released: 07/23/2011 Document Revised: 04/24/2015 Document Reviewed: 12/19/2014 Elsevier Interactive Patient Education 2016 Elsevier Inc.  

## 2015-06-26 NOTE — ED Notes (Signed)
Pt complain of pain in left knee and left hand. States she has been diagnosed with carpel tunnel but has not been top an orthopedic

## 2015-06-28 NOTE — ED Provider Notes (Signed)
CSN: [id number]     Arrival date & time 06/26/15  [id number] History   First MD Initiated Contact with Patient 06/26/15 1019     Chief Complaint  Patient presents with  . Knee Pain     (Consider location/radiation/quality/duration/timing/severity/associated sxs/prior Treatment) HPI   Sandra Orr is a 44 y.o. female who presents to the Emergency Department complaining of left knee and wrist pain.  She reports hx of carpal tunnel to the left wrist, diagnosed by her PMD "a while back" and was advised to see an orthopedic doctor but she states she doesn't have the money.  She has pain with gripping and intermittent numbness and tingling to her second, third and fourth fingers.  She also reports pain to her left knee for several days.  She denies known injury, but reports excessive standing and walking.  She states the pain is worse with bending her knee and improves with rest.  She has been taking tylenol without relief.  She denies swelling or the wrist or knee, redness, fever, chills, and trauma.    Past Medical History  Diagnosis Date  . Depression   . Arthritis   . Obesity   . Back pain    Past Surgical History  Procedure Laterality Date  . Cholecystectomy    . Tubal ligation     Family History  Problem Relation Age of Onset  . Depression Mother     bipolar/schizo  . Heart disease Mother   . Diabetes Mother   . Asthma Father   . Hyperlipidemia Father   . Hypertension Father   . Depression Maternal Grandmother   . Heart disease Maternal Grandmother   . Diabetes Maternal Grandmother   . Hypertension Maternal Grandfather   . Breast cancer Paternal Aunt    Social History  Substance Use Topics  . Smoking status: Current Every Day Smoker -- 0.50 packs/day for 20 years    Types: Cigarettes  . Smokeless tobacco: None  . Alcohol Use: No   OB History    Gravida Para Term Preterm AB TAB SAB Ectopic Multiple Living   3 3 3       3      Review of Systems  Constitutional:  Negative for fever and chills.  Musculoskeletal: Positive for arthralgias (left wrist and knee pain). Negative for joint swelling.  Skin: Negative for color change and wound.  Neurological: Negative for weakness and numbness.  All other systems reviewed and are negative.     Allergies  Penicillins  Home Medications   Prior to Admission medications   Medication Sig Start Date End Date Taking? Authorizing Provider  acetaminophen (TYLENOL) 500 MG tablet Take 500 mg by mouth every 8 (eight) hours as needed.    Historical Provider, MD  albuterol (PROVENTIL HFA;VENTOLIN HFA) 108 (90 BASE) MCG/ACT inhaler Inhale 1-2 puffs into the lungs every 6 (six) hours as needed for wheezing or shortness of breath. 06/02/14   Purvis SheffieldForrest Harrison, MD  diclofenac (VOLTAREN) 75 MG EC tablet Take 1 tablet (75 mg total) by mouth 2 (two) times daily. Take with food 06/26/15   Libertie Hausler, PA-C  traMADol (ULTRAM) 50 MG tablet Take 1 tablet (50 mg total) by mouth every 6 (six) hours as needed. 06/26/15   Ziare Orrick, PA-C   BP 117/61 mmHg  Pulse 55  Temp(Src) 98.3 F (36.8 C) (Oral)  Resp 22  Ht 5\' 4"  (1.626 m)  Wt 380 lb (172.367 kg)  BMI 65.19 kg/m2  SpO2 98% Physical Exam  Constitutional: She is oriented to person, place, and time. She appears well-developed and well-nourished. No distress.  HENT:  Head: Normocephalic.  Neck: Normal range of motion. Neck supple.  Cardiovascular: Normal rate, regular rhythm and intact distal pulses.   Pulmonary/Chest: Effort normal and breath sounds normal.  Musculoskeletal: She exhibits tenderness. She exhibits no edema.  ttp of the anterior left knee. Mild patellar crepitus.   No erythema, effusion, or step-off deformity.  DP pulse brisk, distal sensation intact. Calf is soft and NT.  Mild tenderness to distal left wrist.  Positive Tinsel's sign.  Radial pulse brisk, distal sensation intact  Neurological: She is alert and oriented to person, place, and time. She  exhibits normal muscle tone. Coordination normal.  Skin: Skin is warm and dry. No erythema.  Nursing note and vitals reviewed.   ED Course  Procedures (including critical care time) Labs Review Labs Reviewed - No data to display  Imaging Review Dg Knee Complete 4 Views Left  06/26/2015  CLINICAL DATA:  Pain.  No known injury.  Initial evaluation . EXAM: LEFT KNEE - COMPLETE 4+ VIEW COMPARISON:  None. FINDINGS: Tricompartment degenerative change. No acute abnormality identified. No evidence of fracture or dislocation. IMPRESSION: Tricompartment degenerative change.  No acute abnormality. Electronically Signed   By: Maisie Fus  Register   On: 06/26/2015 11:22   I have personally reviewed and evaluated these images and lab results as part of my medical decision-making.   EKG Interpretation None      MDM   Final diagnoses:  Osteoarthritis of left knee, unspecified osteoarthritis type  Carpal tunnel syndrome of left wrist    Pt is well appearing, vitals stable.  No concerning sx's for septic joint.  XR reviewed.   Wrist splint applied and ACE wrap to left knee, pain improved, remains NV intact.  She agrees to symptomatic tx and close orthopedic f/u.     Pauline Aus, PA-C 06/28/15 2031  Donnetta Hutching, MD 07/01/15 1124

## 2015-07-03 ENCOUNTER — Ambulatory Visit: Payer: Self-pay | Admitting: Family Medicine

## 2015-07-15 ENCOUNTER — Telehealth (HOSPITAL_COMMUNITY): Payer: Self-pay

## 2015-07-15 ENCOUNTER — Encounter: Payer: Self-pay | Admitting: Family Medicine

## 2015-07-15 ENCOUNTER — Ambulatory Visit (INDEPENDENT_AMBULATORY_CARE_PROVIDER_SITE_OTHER): Payer: Self-pay | Admitting: Family Medicine

## 2015-07-15 VITALS — BP 127/65 | HR 81 | Temp 98.6°F | Ht 66.0 in | Wt 367.6 lb

## 2015-07-15 DIAGNOSIS — M25561 Pain in right knee: Secondary | ICD-10-CM

## 2015-07-15 DIAGNOSIS — G5602 Carpal tunnel syndrome, left upper limb: Secondary | ICD-10-CM

## 2015-07-15 DIAGNOSIS — G5600 Carpal tunnel syndrome, unspecified upper limb: Secondary | ICD-10-CM | POA: Insufficient documentation

## 2015-07-15 DIAGNOSIS — M25562 Pain in left knee: Secondary | ICD-10-CM

## 2015-07-15 MED ORDER — METHYLPREDNISOLONE ACETATE 40 MG/ML IJ SUSP
40.0000 mg | Freq: Once | INTRAMUSCULAR | Status: AC
  Administered 2015-07-15: 40 mg via INTRA_ARTICULAR

## 2015-07-15 NOTE — Patient Instructions (Signed)
Keep wearing splint on your wrist We injected knees today If these work you can get another pair of shots in 3 months Follow up with Dr. Wende MottMcKeag in 1 month for your wrist  Be well, Dr. Pollie MeyerMcIntyre    Knee Injection, Care After Refer to this sheet in the next few weeks. These instructions provide you with information about caring for yourself after your procedure. Your health care provider may also give you more specific instructions. Your treatment has been planned according to current medical practices, but problems sometimes occur. Call your health care provider if you have any problems or questions after your procedure. WHAT TO EXPECT AFTER THE PROCEDURE After your procedure, it is common to have:  Soreness.  Warmth.  Swelling. You may have more pain, swelling, and warmth than you did before the injection. This reaction may last for about one day.  HOME CARE INSTRUCTIONS Bathing  If you were given a bandage (dressing), keep it dry until your health care provider says it can be removed. Ask your health care provider when you can start showering or taking a bath. Managing Pain, Stiffness, and Swelling  If directed, apply ice to the injection area:  Put ice in a plastic bag.  Place a towel between your skin and the bag.  Leave the ice on for 20 minutes, 2-3 times per day.  Do not apply heat to your knee.  Raise the injection area above the level of your heart while you are sitting or lying down. Activity  Avoid strenuous activities for as long as directed by your health care provider. Ask your health care provider when you can return to your normal activities. General Instructions  Take medicines only as directed by your health care provider.  Do not take aspirin or other over-the-counter medicines unless your health care provider says you can.  Check your injection site every day for signs of infection. Watch for:  Redness, swelling, or pain.  Fluid, blood, or  pus.  Follow your health care provider's instructions about dressing changes and removal. SEEK MEDICAL CARE IF:  You have symptoms at your injection site that last longer than two days after your procedure.  You have redness, swelling, or pain in your injection area.  You have fluid, blood, or pus coming from your injection site.  You have warmth in your injection area.  You have a fever.  Your pain is not controlled with medicine. SEEK IMMEDIATE MEDICAL CARE IF:  Your knee turns very red.  Your knee becomes very swollen.  Your knee pain is severe.   This information is not intended to replace advice given to you by your health care provider. Make sure you discuss any questions you have with your health care provider.   Document Released: 08/24/2014 Document Reviewed: 08/24/2014 Elsevier Interactive Patient Education [employer] Elsevier Inc.

## 2015-07-15 NOTE — Assessment & Plan Note (Signed)
Prior films with evidence of bilateral osteoarthritis. Intraarticular corticosteroid injections performed today in each knee. Follow up in 1 month with PCP to eval for improvement.

## 2015-07-15 NOTE — Telephone Encounter (Signed)
This is a follow up Assessment following Health Coaching with [facility] on 07/15/15. Per patient her weight was 367.9 at doctor's office this A.M. Which is a loss of around 16 pounds since middle of June. .  Medication Status : Patient states is not taking medication for high cholesterol, hypertension, or diabetes.   Blood Pressure, Self-measurement: Patient states has no reason to check Blood pressure.  Nutrition Assessment: Patient stated that eats 2 fruits every day. Patient states she eats 1 servings of vegetables a day. Per Patient eats 3 or more ounces of whole grains daily. Patient stated doesn't eat two or more servings of fish weekly. Per patient does not like fish. Patient states she does drink more than 36 ounces or 450 calories of beverages with added sugars weekly. Per patient has cut back but not gotten away completely from sugar yet. Per Patient stated she does watch her salt intake because does not really use salt.   Physical Activity Assessment: Patient stated still has not been able to do any activity moderate or vigorous due to her knees. Per patient stated that got injections in knees today and hopes to be able to be more active.  Smoking Status: Patient stated that has cut back to a half pack cigarettes a day. Per patient is now living in a smoke free enviroment which has helped with smoking.  Quality of Life Assessment: In assessing patient's quality of life she stated that out of the past 30 days that she has felt her health was not good for around 26 days when knees were swollen. Patient also stated that in the past 30 days that her mental health was not good including stress, depression and problems with emotions for all days. Patient did state that out of the past 30 days she felt her physical or mental health had not kept her from doing her usual activities including self-care, work or recreation.

## 2015-07-15 NOTE — Progress Notes (Signed)
Date of Visit: 07/15/2015   HPI:  Patient presents to follow up after ED visit. Seen in ED on 11/9 for left hand pain and left knee pain. Had xray which showed tricompartmental osteoarthritis of left knee, given rx for diclofenac and tramadol. Also given splint for L wrist, for presumed carpal tunnel syndrome.  Patient reports pains in knees for years. Worse in cold weather. Has pain primarily with walking. No fevers.  Gets numbness and tingling on L fingers for the last 2 months. Has been wearing splint, but only for about 2 weeks. Diclofenac and tramadol did not help either problem.  ROS: See HPI.  PMFSH: history of chronic pain, depression, type 2 diabetes, hypertension, homelessness, morbid obesity, tobacco abuse  PHYSICAL EXAM: BP 127/65 mmHg  Pulse 81  Temp(Src) 98.6 F (37 C) (Oral)  Ht 5\' 6"  (1.676 m)  Wt 367 lb 9.6 oz (166.742 kg)  BMI 59.36 kg/m2 Gen: NAD, pleasant, cooperative Ext: bilateral knees with crepitus with flexion & extension. MCL and LCL intact to varus and valgus stressing bilaterally. lachman negative bilaterally. L wrist in brace.  ASSESSMENT/PLAN:  Bilateral knee pain Prior films with evidence of bilateral osteoarthritis. Intraarticular corticosteroid injections performed today in each knee. Follow up in 1 month with PCP to eval for improvement.  Carpal tunnel syndrome Continue conservative management with wrist splinting for another 4 weeks. Follow up with PCP in 1 month. If symptoms persisting at that point, consider referral for nerve conduction studies to confirm diagnosis vs hand surgery referral.   PROCEDURE NOTE:  After informed written consent was obtained, patient was seated on exam table. R knee was prepped with alcohol swab. Utilizing anterolateral approach, patient's right knee was injected intraarticularly with mixture of 1cc of depomedrol 40mg /mL and 4cc of 1% lidocaine without epinephrine.   Attention them turned to left knee, which was  prepped with alcohol swab. Utilizing anterolateral approach, patient's left knee was injected intraarticularly with mixture of 1cc of depomedrol 40mg /mL and 4cc of 1% lidocaine. Patient tolerated the procedure well without immediate complications and with symptomatic improvement in her knee pain. Given post-procedure instructions.   FOLLOW UP: F/u in 1 month with PCP for above issues.  GrenadaBrittany J. Pollie MeyerMcIntyre, MD [facility]

## 2015-07-15 NOTE — Assessment & Plan Note (Signed)
Continue conservative management with wrist splinting for another 4 weeks. Follow up with PCP in 1 month. If symptoms persisting at that point, consider referral for nerve conduction studies to confirm diagnosis vs hand surgery referral.

## 2015-07-29 ENCOUNTER — Telehealth: Payer: Self-pay | Admitting: Family Medicine

## 2015-07-29 NOTE — Telephone Encounter (Signed)
Pt called and would like to have referral for the dentist and eye doctor. She said that she has the orange card. jw

## 2015-07-30 ENCOUNTER — Other Ambulatory Visit: Payer: Self-pay | Admitting: Family Medicine

## 2015-07-30 DIAGNOSIS — Z0000 Encounter for general adult medical examination without abnormal findings: Secondary | ICD-10-CM

## 2015-07-30 DIAGNOSIS — E119 Type 2 diabetes mellitus without complications: Secondary | ICD-10-CM

## 2015-07-30 NOTE — Telephone Encounter (Signed)
Eye Doctor--assuming this is for standard eye care and not something more pressing; no referral needed to be seen by an optometrist. [facility].   Dentist--No referral is necessary; just contact their office. Patient can call multiple offices to see who has the best prices prior to making an appointment.

## 2015-07-30 NOTE — Telephone Encounter (Signed)
Referrals placed 

## 2015-07-30 NOTE — Telephone Encounter (Signed)
With the orange card [facility] can [facility] with a referral from PCP. Also she can be seen by an opthalmologist (for diabetic eye exam) in the [facility] for free but this also requires a referral.

## 2015-09-13 ENCOUNTER — Ambulatory Visit (INDEPENDENT_AMBULATORY_CARE_PROVIDER_SITE_OTHER): Payer: Self-pay | Admitting: Family Medicine

## 2015-09-13 VITALS — BP 128/76 | HR 76 | Temp 98.2°F | Wt 368.0 lb

## 2015-09-13 DIAGNOSIS — E1165 Type 2 diabetes mellitus with hyperglycemia: Secondary | ICD-10-CM

## 2015-09-13 DIAGNOSIS — M25562 Pain in left knee: Secondary | ICD-10-CM

## 2015-09-13 DIAGNOSIS — Z114 Encounter for screening for human immunodeficiency virus [HIV]: Secondary | ICD-10-CM

## 2015-09-13 DIAGNOSIS — M25561 Pain in right knee: Secondary | ICD-10-CM

## 2015-09-13 LAB — BASIC METABOLIC PANEL WITH GFR
BUN: 9 mg/dL (ref 7–25)
CO2: 27 mmol/L (ref 20–31)
Calcium: 9.3 mg/dL (ref 8.6–10.2)
Chloride: 98 mmol/L (ref 98–110)
Creat: 0.72 mg/dL (ref 0.50–1.10)
GFR, Est Non African American: >89 mL/min (ref >=60)
GLUCOSE: 82 mg/dL (ref 65–99)
Potassium: 4.1 mmol/L (ref 3.5–5.3)
Sodium: 138 mmol/L (ref 135–146)

## 2015-09-13 MED ORDER — HYDROCORTISONE 1 % EX LOTN: 1.0000 "application " | TOPICAL_LOTION | Freq: Two times a day (BID) | CUTANEOUS | Status: DC

## 2015-09-13 MED ORDER — NAPROXEN 500 MG PO TABS: 500.0000 mg | ORAL_TABLET | Freq: Two times a day (BID) | ORAL | Status: DC

## 2015-09-13 NOTE — Progress Notes (Signed)
   HPI  CC: Bilateral knee pain Patient is here with complaints of bilateral knee pain. She recently received bilateral corticosteroid injections approximately 2 months ago. She states that she had significant improvement in her pain from these injections however since that time the benefits have worn off. Patient states that her pain is so severe that she finds it extremely difficult to perform work on her feet. She denies any swelling, popping, clicking, catching. She denies any recent injury. No excessive warmth or erythema. She denies any weakness, numbness, paresthesias.  Review of Systems   See HPI for ROS. All other systems reviewed and are negative.  Past medical history and social history reviewed and updated in the EMR as appropriate.  Objective: BP 128/76 mmHg  Pulse 76  Temp(Src) 98.2 F (36.8 C) (Oral)  Wt 368 lb (166.924 kg) Gen: NAD, alert, cooperative, and pleasant. Bilateral Knee: Normal to inspection with no erythema or effusion or obvious bony abnormalities. Palpation normal with no warmth or patellar tenderness or condyle tenderness. Mild to moderate medial joint line tenderness laterally ROM normal in flexion and extension and lower leg rotation. Ligaments with solid consistent endpoints including ACL, PCL, LCL, MCL. Negative Mcmurray's and provocative meniscal tests. Non painful patellar compression. Patellar and quadriceps tendons unremarkable. Hamstring and quadriceps strength is normal. Neuro: Alert and oriented, Speech clear, No gross deficits  Assessment and plan:  Bilateral knee pain Patient is here with complaints of bilateral knee pain. Patient has a history of tricompartment arthritis in both knees. She states that she has received significant benefit from her bilateral steroid injections however they have since worn off. She denies any new injuries, effusions, warmth/redness, weakness, or decreased range of motion. Etiology likely worsening arthritis  secondary to patient's severe morbid obesity (BMI >60). Patient's weight is slightly improved from previous with a weight loss of approximately 5-7 pounds since October. - I informed patient that I'll be unable to perform knee injections for her today as she received previous injections only 2 months ago. - I informed patient that weight loss would be the most significant treatment for her issues at this time. Patient states that she has been making a active attempts towards weight loss and would continue to try. - I prescribed for patient naproxen 500 mg twice a day when necessary. Previous kidney function was good, will repeat BMP today. - I advised patient that icing may be the cheapest alternative to help reduce her pain. - Patient would likely benefit from physical therapy, this will be strongly considered in the future. - Follow-up as needed.    Orders Placed This Encounter  Procedures  . Microalbumin/Creatinine Ratio, Urine  . HIV antibody (with reflex)  . BASIC METABOLIC PANEL WITH GFR    Meds ordered this encounter  Medications  . naproxen (NAPROSYN) 500 MG tablet    Sig: Take 1 tablet (500 mg total) by mouth 2 (two) times daily with a meal.    Dispense:  60 tablet    Refill:  2  . hydrocortisone 1 % lotion    Sig: Apply 1 application topically 2 (two) times daily.    Dispense:  118 mL    Refill:  0     Kathee Delton, MD,MS,  PGY2 09/13/2015 5:51 PM

## 2015-09-13 NOTE — Assessment & Plan Note (Signed)
Patient is here with complaints of bilateral knee pain. Patient has a history of tricompartment arthritis in both knees. She states that she has received significant benefit from her bilateral steroid injections however they have since worn off. She denies any new injuries, effusions, warmth/redness, weakness, or decreased range of motion. Etiology likely worsening arthritis secondary to patient's severe morbid obesity (BMI >60). Patient's weight is slightly improved from previous with a weight loss of approximately 5-7 pounds since October. - I informed patient that I'll be unable to perform knee injections for her today as she received previous injections only 2 months ago. - I informed patient that weight loss would be the most significant treatment for her issues at this time. Patient states that she has been making a active attempts towards weight loss and would continue to try. - I prescribed for patient naproxen 500 mg twice a day when necessary. Previous kidney function was good, will repeat BMP today. - I advised patient that icing may be the cheapest alternative to help reduce her pain. - Patient would likely benefit from physical therapy, this will be strongly considered in the future. - Follow-up as needed.

## 2015-09-14 LAB — MICROALBUMIN / CREATININE URINE RATIO
CREATININE, URINE: 193 mg/dL (ref 20–320)
MICROALB UR: 0.4 mg/dL
MICROALB/CREAT RATIO: 2 ug/mg{creat} (ref <30)

## 2015-09-14 LAB — HIV ANTIBODY (ROUTINE TESTING W REFLEX): HIV 1&2 Ab, 4th Generation: NONREACTIVE

## 2015-09-16 ENCOUNTER — Telehealth: Payer: Self-pay | Admitting: Family Medicine

## 2015-09-16 NOTE — Telephone Encounter (Signed)
Pt called and would like a sleep study done. Please send the referral in and let her know when to schedule an appointment, jw

## 2015-09-17 ENCOUNTER — Other Ambulatory Visit: Payer: Self-pay | Admitting: Family Medicine

## 2015-09-17 DIAGNOSIS — Z87898 Personal history of other specified conditions: Secondary | ICD-10-CM

## 2015-09-17 NOTE — Telephone Encounter (Signed)
Referral is definitely appropriate and has been sent.

## 2015-09-23 ENCOUNTER — Institutional Professional Consult (permissible substitution): Payer: Self-pay | Admitting: Internal Medicine

## 2015-10-16 ENCOUNTER — Emergency Department (HOSPITAL_COMMUNITY)
Admission: EM | Admit: 2015-10-16 | Discharge: 2015-10-17 | Disposition: A | Discharge status: Home / Self Care | Payer: Self-pay | Attending: Emergency Medicine | Admitting: Emergency Medicine

## 2015-10-16 ENCOUNTER — Encounter (HOSPITAL_COMMUNITY): Payer: Self-pay

## 2015-10-16 DIAGNOSIS — Z9851 Tubal ligation status: Secondary | ICD-10-CM | POA: Insufficient documentation

## 2015-10-16 DIAGNOSIS — Z9049 Acquired absence of other specified parts of digestive tract: Secondary | ICD-10-CM | POA: Insufficient documentation

## 2015-10-16 DIAGNOSIS — Z880 Allergy status to penicillin: Secondary | ICD-10-CM | POA: Insufficient documentation

## 2015-10-16 DIAGNOSIS — Z79899 Other long term (current) drug therapy: Secondary | ICD-10-CM | POA: Insufficient documentation

## 2015-10-16 DIAGNOSIS — Z8719 Personal history of other diseases of the digestive system: Secondary | ICD-10-CM | POA: Insufficient documentation

## 2015-10-16 DIAGNOSIS — F329 Major depressive disorder, single episode, unspecified: Secondary | ICD-10-CM | POA: Insufficient documentation

## 2015-10-16 DIAGNOSIS — F17210 Nicotine dependence, cigarettes, uncomplicated: Secondary | ICD-10-CM | POA: Insufficient documentation

## 2015-10-16 DIAGNOSIS — M549 Dorsalgia, unspecified: Secondary | ICD-10-CM | POA: Insufficient documentation

## 2015-10-16 DIAGNOSIS — R112 Nausea with vomiting, unspecified: Secondary | ICD-10-CM | POA: Insufficient documentation

## 2015-10-16 LAB — POC URINE PREG, ED: Preg Test, Ur: NEGATIVE

## 2015-10-16 MED ORDER — ONDANSETRON HCL 4 MG/2ML IJ SOLN
4.0000 mg | Freq: Once | INTRAMUSCULAR | Status: AC
  Administered 2015-10-17: 4 mg via INTRAVENOUS
  Filled 2015-10-16: qty 2

## 2015-10-16 MED ORDER — MORPHINE SULFATE (PF) 4 MG/ML IV SOLN
4.0000 mg | Freq: Once | INTRAVENOUS | Status: AC
  Administered 2015-10-17: 4 mg via INTRAVENOUS
  Filled 2015-10-16: qty 1

## 2015-10-16 NOTE — ED Notes (Signed)
Pt reports pain in her mid back x 2 weeks, states it is the same pain she had when she had gallstones and feels like it flares up when she eats a greasy meal.  Pt denies injury or trauma

## 2015-10-16 NOTE — ED Provider Notes (Signed)
CSN: [id number]     Arrival date & time 10/16/15  2328 History  By signing my name below, I, Linus Galas, attest that this documentation has been prepared under the direction and in the presence of Zadie Rhine, MD. Electronically Signed: Linus Galas, ED Scribe. 10/16/2015. 11:52 PM.   Chief Complaint  Patient presents with  . Back Pain   Patient is a 45 y.o. female presenting with back pain. The history is provided by the patient. No language interpreter was used.  Back Pain Location:  Thoracic spine Radiates to:  Does not radiate Pain severity:  Mild Onset quality:  Sudden Duration:  2 weeks Timing:  Intermittent Progression:  Unchanged Chronicity:  Recurrent Relieved by:  Nothing Worsened by:  Nothing tried Ineffective treatments:  None tried Associated symptoms: abdominal pain   Associated symptoms: no chest pain, no dysuria, no headaches, no numbness and no weakness     HPI Comments: Sandra Orr is a 45 y.o. female with PMHx of gallstones and cholecystectomy who presents to the Emergency Department complaining of mid back pain for the past 2 weeks. Pain worse with greasy foods and alleviated with palpation. She associated her pain with gallstone pain.  No trauma/falls reported No urinary/fecal incontinence reported No pleuritic CP reported Past Medical History  Diagnosis Date  . Depression   . Arthritis   . Obesity   . Back pain    Past Surgical History  Procedure Laterality Date  . Cholecystectomy    . Tubal ligation     Family History  Problem Relation Age of Onset  . Depression Mother     bipolar/schizo  . Heart disease Mother   . Diabetes Mother   . Asthma Father   . Hyperlipidemia Father   . Hypertension Father   . Depression Maternal Grandmother   . Heart disease Maternal Grandmother   . Diabetes Maternal Grandmother   . Hypertension Maternal Grandfather   . Breast cancer Paternal Aunt    Social History  Substance Use Topics  . Smoking  status: Current Every Day Smoker -- 0.50 packs/day for 20 years    Types: Cigarettes  . Smokeless tobacco: None  . Alcohol Use: No   OB History    Gravida Para Term Preterm AB TAB SAB Ectopic Multiple Living   Review of Systems  Cardiovascular: Negative for chest pain.  Gastrointestinal: Positive for nausea, vomiting and abdominal pain. Negative for diarrhea.  Genitourinary: Negative for dysuria and frequency.  Musculoskeletal: Positive for back pain.  Neurological: Negative for weakness, numbness and headaches.  All other systems reviewed and are negative.   Allergies  Penicillins  Home Medications   Prior to Admission medications   Medication Sig Start Date End Date Taking? Authorizing Provider  acetaminophen (TYLENOL) 500 MG tablet Take 500 mg by mouth every 8 (eight) hours as needed.   Yes Historical Provider, MD  albuterol (PROVENTIL HFA;VENTOLIN HFA) 108 (90 BASE) MCG/ACT inhaler Inhale 1-2 puffs into the lungs every 6 (six) hours as needed for wheezing or shortness of breath. 06/02/14  Yes Purvis Sheffield, MD  naproxen (NAPROSYN) 500 MG tablet Take 1 tablet (500 mg total) by mouth 2 (two) times daily with a meal. 09/13/15  Yes Kathee Delton, MD  diclofenac (VOLTAREN) 75 MG EC tablet Take 1 tablet (75 mg total) by mouth 2 (two) times daily. Take with food 06/26/15   Tammy Triplett, PA-C  hydrocortisone 1 %  lotion Apply 1 application topically 2 (two) times daily. 09/13/15   Kathee Delton, MD  traMADol (ULTRAM) 50 MG tablet Take 1 tablet (50 mg total) by mouth every 6 (six) hours as needed. 06/26/15   Tammy Triplett, PA-C   BP 105/63 mmHg  Pulse 67  Temp(Src) 98.1 F (36.7 C) (Oral)  Resp 20  Ht  (1.651 m)  Wt 380 lb (172.367 kg)  BMI 63.24 kg/m2  SpO2 100%   Physical Exam CONSTITUTIONAL: Well developed/well nourished HEAD: Normocephalic/atraumatic EYES: EOMI/PERRL ENMT: Mucous membranes moist NECK: supple no meningeal signs SPINE/BACK:entire  spine nontender, pt reports pain improved with palpation. CV: S1/S2 noted, no murmurs/rubs/gallops noted LUNGS: Lungs are clear to auscultation bilaterally, no apparent distress ABDOMEN: soft, nontender, no rebound or guarding, bowel sounds noted throughout abdomen GU:no cva tenderness NEURO: Pt is awake/alert/appropriate, moves all extremitiesx4.  No facial droop, no focal weakness in extremities. Pt can ambulate EXTREMITIES: pulses normal/equal, full ROM SKIN: warm, color normal PSYCH: no abnormalities of mood noted, alert and oriented to situation  ED Course  Procedures   DIAGNOSTIC STUDIES: Oxygen Saturation is 100% on room air, normal by my interpretation.    COORDINATION OF CARE: 11:44 PM Will give Zofran. Will order blood work, urinalysis, and EKG. Discussed treatment plan with pt at bedside and pt agreed to plan.  1:41 AM Pt improved Labs reassuring No focal abd tenderness No focal back tenderness She reports her pain is worse with greasy food Advised to avoid greasy food.  Will see PCP in 2 days We discussed strict return precautions  Labs Review Labs Reviewed  COMPREHENSIVE METABOLIC PANEL - Abnormal; Notable for the following:    Glucose, Bld 110 (*)    All other components within normal limits  CBC WITH DIFFERENTIAL/PLATELET - Abnormal; Notable for the following:    WBC 10.8 (*)    All other components within normal limits  LIPASE, BLOOD  URINALYSIS, ROUTINE W REFLEX MICROSCOPIC (NOT AT [facility])  POC URINE PREG, ED    I have personally reviewed and evaluated these  lab results as part of my medical decision-making.   EKG Interpretation   Date/Time:  Thursday October 17 2015 00:32:40 EST Ventricular Rate:  62 PR Interval:  153 QRS Duration: 99 QT Interval:  423 QTC Calculation: 429 R Axis:   30 Text Interpretation:  Sinus rhythm Low voltage, precordial leads  Borderline T abnormalities, inferior leads artifact noted which limits  evaluation No significant  change since last tracing Confirmed by Bebe Shaggy   MD, Dorinda Hill ([id number]) on 10/17/2015 12:45:34 AM      MDM   Final diagnoses:  Back pain, unspecified location    Nursing notes including past medical history and social history reviewed and considered in documentation Labs/vital reviewed myself and considered during evaluation    I personally performed the services described in this documentation, which was scribed in my presence. The recorded information has been reviewed and is accurate.       Zadie Rhine, MD 10/17/15 [phone]

## 2015-10-17 LAB — COMPREHENSIVE METABOLIC PANEL
ALBUMIN: 3.9 g/dL (ref 3.5–5.0)
ALK PHOS: 84 U/L (ref 38–126)
ALT: 15 U/L (ref 14–54)
AST: 18 U/L (ref 15–41)
Anion gap: 8 (ref 5–15)
BILIRUBIN TOTAL: 0.6 mg/dL (ref 0.3–1.2)
BUN: 11 mg/dL (ref 6–20)
CALCIUM: 8.9 mg/dL (ref 8.9–10.3)
CO2: 25 mmol/L (ref 22–32)
Chloride: 105 mmol/L (ref 101–111)
Creatinine, Ser: 0.82 mg/dL (ref 0.44–1.00)
GFR calc Af Amer: >60 mL/min (ref >60)
GFR calc non Af Amer: >60 mL/min (ref >60)
GLUCOSE: 110 mg/dL — AB (ref 65–99)
POTASSIUM: 3.7 mmol/L (ref 3.5–5.1)
SODIUM: 138 mmol/L (ref 135–145)
TOTAL PROTEIN: 7.3 g/dL (ref 6.5–8.1)

## 2015-10-17 LAB — URINALYSIS, ROUTINE W REFLEX MICROSCOPIC
BILIRUBIN URINE: NEGATIVE
GLUCOSE, UA: NEGATIVE mg/dL
HGB URINE DIPSTICK: NEGATIVE
KETONES UR: NEGATIVE mg/dL
Leukocytes, UA: NEGATIVE
Nitrite: NEGATIVE
PH: 6 (ref 5.0–8.0)
PROTEIN: NEGATIVE mg/dL
Specific Gravity, Urine: 1.02 (ref 1.005–1.030)

## 2015-10-17 LAB — CBC WITH DIFFERENTIAL/PLATELET
Basophils Absolute: 0 10*3/uL (ref 0.0–0.1)
Basophils Relative: 0 %
EOS ABS: 0.2 10*3/uL (ref 0.0–0.7)
EOS PCT: 2 %
HCT: 39.6 % (ref 36.0–46.0)
Hemoglobin: 13 g/dL (ref 12.0–15.0)
LYMPHS PCT: 36 %
Lymphs Abs: 3.9 10*3/uL (ref 0.7–4.0)
MCH: 29.8 pg (ref 26.0–34.0)
MCHC: 32.8 g/dL (ref 30.0–36.0)
MCV: 90.8 fL (ref 78.0–100.0)
MONO ABS: 0.7 10*3/uL (ref 0.1–1.0)
MONOS PCT: 6 %
Neutro Abs: 6 10*3/uL (ref 1.7–7.7)
Neutrophils Relative %: 56 %
PLATELETS: 231 10*3/uL (ref 150–400)
RBC: 4.36 MIL/uL (ref 3.87–5.11)
RDW: 14.2 % (ref 11.5–15.5)
WBC: 10.8 10*3/uL — ABNORMAL HIGH (ref 4.0–10.5)

## 2015-10-17 LAB — LIPASE, BLOOD: LIPASE: 29 U/L (ref 11–51)

## 2015-10-17 NOTE — ED Notes (Signed)
Provided pt with saltine crackers and cranberry juice at pts request

## 2015-10-17 NOTE — Discharge Instructions (Signed)

## 2015-10-17 NOTE — ED Notes (Signed)
Pt states understanding of care given and follow up instructions.  Ambulated from ED  

## 2015-10-18 ENCOUNTER — Encounter: Payer: Self-pay | Admitting: Family Medicine

## 2015-10-18 ENCOUNTER — Ambulatory Visit (INDEPENDENT_AMBULATORY_CARE_PROVIDER_SITE_OTHER): Payer: Self-pay | Admitting: Family Medicine

## 2015-10-18 VITALS — BP 149/82 | HR 77 | Temp 97.4°F | Ht 65.0 in | Wt 369.0 lb

## 2015-10-18 DIAGNOSIS — M25561 Pain in right knee: Secondary | ICD-10-CM

## 2015-10-18 DIAGNOSIS — R1084 Generalized abdominal pain: Secondary | ICD-10-CM

## 2015-10-18 DIAGNOSIS — R21 Rash and other nonspecific skin eruption: Secondary | ICD-10-CM | POA: Insufficient documentation

## 2015-10-18 DIAGNOSIS — R109 Unspecified abdominal pain: Secondary | ICD-10-CM | POA: Insufficient documentation

## 2015-10-18 DIAGNOSIS — K5900 Constipation, unspecified: Secondary | ICD-10-CM

## 2015-10-18 DIAGNOSIS — M25562 Pain in left knee: Secondary | ICD-10-CM

## 2015-10-18 NOTE — Patient Instructions (Addendum)
Nice to meet you today. Take Miralax daily or twice daily for constipation. Try to eat less fatty and greasy foods as these are upsetting her stomach. This could be related to not having your gallbladder anymore. Continue to moisturize with lotion, try Eucerin, daily. Try the hydrocortisone and Vaseline on the spot on your hand. Continue take the naproxen for your knee pain. Try to do the exercises and walk as much as possible. Follow-up with your primary care doctor in one month.  Take care, Dr. B  Arthritis Arthritis means joint pain. It can also mean joint disease. A joint is a place where bones come together. People who have arthritis may have:  Red joints.  Swollen joints.  Stiff joints.  Warm joints.  A fever.  A feeling of being sick. HOME CARE Pay attention to any changes in your symptoms. Take these actions to help with your pain and swelling. Medicines  Take over-the-counter and prescription medicines only as told by your doctor.  Do not take aspirin for pain if your doctor says that you may have gout. Activities  Rest your joint if your doctor tells you to.  Avoid activities that make the pain worse.  Exercise your joint regularly as told by your doctor. Try doing exercises like:  Swimming.  Water aerobics.  Biking.  Walking. Joint Care  If your joint is swollen, keep it raised (elevated) if told by your doctor.  If your joint feels stiff in the morning, try taking a warm shower.  If you have diabetes, do not apply heat without asking your doctor.  If told, apply heat to the joint:  Put a towel between the joint and the hot pack or heating pad.  Leave the heat on the area for 20-30 minutes.  If told, apply ice to the joint:  Put ice in a plastic bag.  Place a towel between your skin and the bag.  Leave the ice on for 20 minutes, 2-3 times per day.  Keep all follow-up visits as told by your doctor. GET HELP IF:  The pain gets worse.  You  have a fever. GET HELP RIGHT AWAY IF:  You have very bad pain in your joint.  You have swelling in your joint.  Your joint is red.  Many joints become painful and swollen.  You have very bad back pain.  Your leg is very weak.  You cannot control your pee (urine) or poop (stool).   This information is not intended to replace advice given to you by your health care provider. Make sure you discuss any questions you have with your health care provider.   Document Released: 10/28/2009 Document Revised: 04/24/2015 Document Reviewed: 10/29/2014 Elsevier Interactive Patient Education [employer]2016 [employer] Inc.

## 2015-10-18 NOTE — Assessment & Plan Note (Signed)
Discussed that this is likely arthritis related to morbid obesity Advised knee exercises and walking 1-2 times daily Continue naproxen 500 mg twice a day Follow-up with PCP in one month

## 2015-10-18 NOTE — Assessment & Plan Note (Signed)
Advised daily Mira lax Can increase to twice a day dosing as needed to produce one soft bowel movement daily

## 2015-10-18 NOTE — Assessment & Plan Note (Signed)
Unable to palpate any abdominal or back pain during visit today Patient is status post cholecystectomy, so highly doubt gallstones Advised patient that she may be getting abdominal cramping related to postcholecystectomy bile dumping syndrome Advised her to avoid greasy and fatty foods if these are making the pain worse

## 2015-10-18 NOTE — Assessment & Plan Note (Signed)
Appears to be small patch of eczema or very dry skin Advised patient to use Eucerin twice daily and put hydrocortisone max strength and Vaseline over the lesion twice daily

## 2015-10-18 NOTE — Progress Notes (Signed)
   Subjective:   Sandra Orr is a 45 y.o. female with a history of HTN, T2DM, morbid obesity here for f/u knee pain, back/abd pain  Bilateral knee pain - chronic pain for years - continues to get worse - knows that losing weight - taking naprosyn BID for ~1 month - seems to take down swelling but not pain - affecting function  Mid back pain  - wonders if this is gall stones - has had them in the past and this feels similar (of note patient is s/p cholecystectomy) - worse with eating greasy foods - intermittent - lasts for 30-19220min - also associated with diffuse abd pain and nausea - seems to come from abd pain - reports constipation - taking mag citrate prn - no fevers, diarrhea - no numbness, weakness, incontinence  Rash - dry flaky spot on back of L hand - present for 1-2 months - tried lotion and hydrocortisone cream - not gettign better   Review of Systems:  Per HPI.   Social History: current smoker  Objective:  BP 149/82 mmHg  Pulse 77  Temp(Src) 97.4 F (36.3 C) (Oral)  Ht 5\' 5"  (1.651 m)  Wt 369 lb (167.377 kg)  BMI 61.40 kg/m2  Gen:  45 y.o. female in NAD, morbidly obese HEENT: NCAT, MMM, EOMI, PERRL, anicteric sclerae CV: RRR, no MRG Resp: Non-labored, CTAB, no wheezes noted Abd: Soft, NTND, BS present, no guarding or organomegaly, no murphy's sign Ext: WWP, no edema MSK: Back: No ttp over paraspinal musculature or spinous processes, b/l knees: diffusely TTP, normal to inspection, no swelling or effusions, Full ROM, strength intact Skin: 1 cm dry and flaky patch on dorsum of left hand, no erythema Neuro: Alert and oriented, speech normal    Assessment & Plan:     Sandra AugustVeronica A Orr is a 45 y.o. female here for:  Constipation Advised daily Mira lax Can increase to twice a day dosing as needed to produce one soft bowel movement daily  Rash and nonspecific skin eruption Appears to be small patch of eczema or very dry skin Advised patient to use  Eucerin twice daily and put hydrocortisone max strength and Vaseline over the lesion twice daily  Bilateral knee pain Discussed that this is likely arthritis related to morbid obesity Advised knee exercises and walking 1-2 times daily Continue naproxen 500 mg twice a day Follow-up with PCP in one month  Abdominal pain Unable to palpate any abdominal or back pain during visit today Patient is status post cholecystectomy, so highly doubt gallstones Advised patient that she may be getting abdominal cramping related to postcholecystectomy bile dumping syndrome Advised her to avoid greasy and fatty foods if these are making the pain worse       Erasmo DownerAngela M Saiya Crist, MD MPH PGY-2,  [facility] 10/18/2015  10:13 AM

## 2015-10-29 ENCOUNTER — Encounter (HOSPITAL_COMMUNITY): Payer: Self-pay | Admitting: *Deleted

## 2015-10-29 ENCOUNTER — Emergency Department (HOSPITAL_COMMUNITY)
Admission: EM | Admit: 2015-10-29 | Discharge: 2015-10-29 | Disposition: A | Discharge status: Home / Self Care | Payer: Self-pay | Attending: Emergency Medicine | Admitting: Emergency Medicine

## 2015-10-29 DIAGNOSIS — Z9049 Acquired absence of other specified parts of digestive tract: Secondary | ICD-10-CM | POA: Insufficient documentation

## 2015-10-29 DIAGNOSIS — K529 Noninfective gastroenteritis and colitis, unspecified: Secondary | ICD-10-CM | POA: Insufficient documentation

## 2015-10-29 DIAGNOSIS — Z79899 Other long term (current) drug therapy: Secondary | ICD-10-CM | POA: Insufficient documentation

## 2015-10-29 DIAGNOSIS — E669 Obesity, unspecified: Secondary | ICD-10-CM | POA: Insufficient documentation

## 2015-10-29 DIAGNOSIS — F17210 Nicotine dependence, cigarettes, uncomplicated: Secondary | ICD-10-CM | POA: Insufficient documentation

## 2015-10-29 DIAGNOSIS — F329 Major depressive disorder, single episode, unspecified: Secondary | ICD-10-CM | POA: Insufficient documentation

## 2015-10-29 LAB — COMPREHENSIVE METABOLIC PANEL
ALK PHOS: 93 U/L (ref 38–126)
ALT: 17 U/L (ref 14–54)
ANION GAP: 9 (ref 5–15)
AST: 18 U/L (ref 15–41)
Albumin: 4.1 g/dL (ref 3.5–5.0)
BILIRUBIN TOTAL: 0.5 mg/dL (ref 0.3–1.2)
BUN: 14 mg/dL (ref 6–20)
CALCIUM: 9.1 mg/dL (ref 8.9–10.3)
CO2: 27 mmol/L (ref 22–32)
CREATININE: 0.79 mg/dL (ref 0.44–1.00)
Chloride: 103 mmol/L (ref 101–111)
GFR calc non Af Amer: >60 mL/min (ref >60)
GLUCOSE: 112 mg/dL — AB (ref 65–99)
Potassium: 4.1 mmol/L (ref 3.5–5.1)
SODIUM: 139 mmol/L (ref 135–145)
TOTAL PROTEIN: 8 g/dL (ref 6.5–8.1)

## 2015-10-29 LAB — CBC WITH DIFFERENTIAL/PLATELET
BASOS ABS: 0 10*3/uL (ref 0.0–0.1)
BASOS PCT: 0 %
EOS PCT: 2 %
Eosinophils Absolute: 0.2 10*3/uL (ref 0.0–0.7)
HEMATOCRIT: 45 % (ref 36.0–46.0)
Hemoglobin: 14.8 g/dL (ref 12.0–15.0)
Lymphocytes Relative: 6 %
Lymphs Abs: 0.7 10*3/uL (ref 0.7–4.0)
MCH: 30.2 pg (ref 26.0–34.0)
MCHC: 32.9 g/dL (ref 30.0–36.0)
MCV: 91.8 fL (ref 78.0–100.0)
MONO ABS: 0.4 10*3/uL (ref 0.1–1.0)
Monocytes Relative: 4 %
Neutro Abs: 9 10*3/uL — ABNORMAL HIGH (ref 1.7–7.7)
Neutrophils Relative %: 88 %
Platelets: 226 10*3/uL (ref 150–400)
RBC: 4.9 MIL/uL (ref 3.87–5.11)
RDW: 14.5 % (ref 11.5–15.5)
WBC: 10.2 10*3/uL (ref 4.0–10.5)

## 2015-10-29 MED ORDER — DICYCLOMINE HCL 20 MG PO TABS
ORAL_TABLET | ORAL | Status: DC
Start: 2015-10-29 — End: 2016-01-03

## 2015-10-29 MED ORDER — KETOROLAC TROMETHAMINE 30 MG/ML IJ SOLN
30.0000 mg | Freq: Once | INTRAMUSCULAR | Status: AC
Start: 2015-10-29 — End: 2015-10-29
  Administered 2015-10-29: 30 mg via INTRAVENOUS
  Filled 2015-10-29: qty 1

## 2015-10-29 MED ORDER — ONDANSETRON HCL 4 MG/2ML IJ SOLN
4.0000 mg | Freq: Once | INTRAMUSCULAR | Status: AC
  Administered 2015-10-29: 4 mg via INTRAVENOUS
  Filled 2015-10-29: qty 2

## 2015-10-29 MED ORDER — DICYCLOMINE HCL 10 MG/ML IM SOLN
20.0000 mg | Freq: Once | INTRAMUSCULAR | Status: AC
  Administered 2015-10-29: 20 mg via INTRAMUSCULAR
  Filled 2015-10-29: qty 2

## 2015-10-29 MED ORDER — SODIUM CHLORIDE 0.9 % IV BOLUS (SEPSIS)
2000.0000 mL | Freq: Once | INTRAVENOUS | Status: AC
  Administered 2015-10-29: 2000 mL via INTRAVENOUS

## 2015-10-29 MED ORDER — ONDANSETRON 4 MG PO TBDP: ORAL_TABLET | ORAL | Status: DC

## 2015-10-29 NOTE — ED Notes (Signed)
MD at bedside. 

## 2015-10-29 NOTE — Discharge Instructions (Signed)
Drink plenty of fluids. Follow-up with their family doctor in 2-3 days if not improving

## 2015-10-29 NOTE — ED Provider Notes (Signed)
CSN: [id number]     Arrival date & time 10/29/15  1216 History   First MD Initiated Contact with Patient 10/29/15 1249     Chief Complaint  Patient presents with  . Emesis     (Consider location/radiation/quality/duration/timing/severity/associated sxs/prior Treatment) Patient is a 45 y.o. female presenting with vomiting. The history is provided by the patient (Patient is complaining of vomiting and some diarrhea. This started yesterday).  Emesis Severity:  Moderate Timing:  Constant Quality:  Bilious material Able to tolerate:  Liquids Progression:  Unchanged Chronicity:  New Associated symptoms: diarrhea   Associated symptoms: no abdominal pain and no headaches     Past Medical History  Diagnosis Date  . Depression   . Arthritis   . Obesity   . Back pain    Past Surgical History  Procedure Laterality Date  . Cholecystectomy    . Tubal ligation     Family History  Problem Relation Age of Onset  . Depression Mother     bipolar/schizo  . Heart disease Mother   . Diabetes Mother   . Asthma Father   . Hyperlipidemia Father   . Hypertension Father   . Depression Maternal Grandmother   . Heart disease Maternal Grandmother   . Diabetes Maternal Grandmother   . Hypertension Maternal Grandfather   . Breast cancer Paternal Aunt    Social History  Substance Use Topics  . Smoking status: Current Every Day Smoker -- 0.50 packs/day for 20 years    Types: Cigarettes  . Smokeless tobacco: None  . Alcohol Use: No   OB History    Gravida Para Term Preterm AB TAB SAB Ectopic Multiple Living   Review of Systems  Constitutional: Negative for appetite change and fatigue.  HENT: Negative for congestion, ear discharge and sinus pressure.   Eyes: Negative for discharge.  Respiratory: Negative for cough.   Cardiovascular: Negative for chest pain.  Gastrointestinal: Positive for vomiting and diarrhea. Negative for abdominal pain.  Genitourinary: Negative  for frequency and hematuria.  Musculoskeletal: Negative for back pain.  Skin: Negative for rash.  Neurological: Negative for seizures and headaches.  Psychiatric/Behavioral: Negative for hallucinations.      Allergies  Penicillins  Home Medications   Prior to Admission medications   Medication Sig Start Date End Date Taking? Authorizing Provider  albuterol (PROVENTIL HFA;VENTOLIN HFA) 108 (90 BASE) MCG/ACT inhaler Inhale 1-2 puffs into the lungs every 6 (six) hours as needed for wheezing or shortness of breath. 06/02/14   Purvis Sheffield, MD   BP 143/89 mmHg  Pulse 96  Temp(Src) 98.6 F (37 C) (Oral)  Resp 16  Ht  (1.676 m)  Wt 340 lb (154.223 kg)  BMI 54.90 kg/m2  SpO2 97% Physical Exam  Constitutional: She is oriented to person, place, and time. She appears well-developed.  HENT:  Head: Normocephalic.  Eyes: Conjunctivae and EOM are normal. No scleral icterus.  Neck: Neck supple. No thyromegaly present.  Cardiovascular: Normal rate and regular rhythm.  Exam reveals no gallop and no friction rub.   No murmur heard. Pulmonary/Chest: No stridor. She has no wheezes. She has no rales. She exhibits no tenderness.  Abdominal: She exhibits no distension. There is tenderness. There is no rebound.  Mild tenderness throughout  Musculoskeletal: Normal range of motion. She exhibits no edema.  Lymphadenopathy:    She has no cervical adenopathy.  Neurological: She is oriented to person, place,  and time. She exhibits normal muscle tone. Coordination normal.  Skin: No rash noted. No erythema.  Psychiatric: She has a normal mood and affect. Her behavior is normal.    ED Course  Procedures (including critical care time) Labs Review Labs Reviewed  CBC WITH DIFFERENTIAL/PLATELET - Abnormal; Notable for the following:    Neutro Abs 9.0 (*)    All other components within normal limits  COMPREHENSIVE METABOLIC PANEL - Abnormal; Notable for the following:    Glucose, Bld 112 (*)     All other components within normal limits    Imaging Review No results found. I have personally reviewed and evaluated these images and lab results as part of my medical decision-making.   EKG Interpretation None      MDM   Final diagnoses:  None   Gastroenteritis moderate dehydration patient improved with normal saline. She will be discharged with Zofran and Bentyl will follow-up with her PCP    Bethann BerkshireJoseph Courtne Lighty, MD 10/29/15 1535

## 2015-10-29 NOTE — ED Notes (Signed)
Pt comes in with emesis starting this am at 0500. She had has diarrhea in addition to this.

## 2015-10-29 NOTE — ED Notes (Signed)
Oral care done.

## 2015-11-04 ENCOUNTER — Ambulatory Visit: Payer: Self-pay

## 2015-11-11 ENCOUNTER — Ambulatory Visit: Payer: Self-pay

## 2015-12-09 ENCOUNTER — Ambulatory Visit: Payer: Self-pay | Admitting: Family Medicine

## 2015-12-20 ENCOUNTER — Ambulatory Visit: Payer: Self-pay | Admitting: Family Medicine

## 2015-12-30 ENCOUNTER — Ambulatory Visit: Payer: Self-pay | Admitting: Family Medicine

## 2016-01-03 ENCOUNTER — Ambulatory Visit (INDEPENDENT_AMBULATORY_CARE_PROVIDER_SITE_OTHER): Payer: Self-pay | Admitting: Internal Medicine

## 2016-01-03 ENCOUNTER — Encounter: Payer: Self-pay | Admitting: Internal Medicine

## 2016-01-03 VITALS — BP 128/82 | HR 63 | Ht 66.0 in | Wt 364.0 lb

## 2016-01-03 DIAGNOSIS — G4733 Obstructive sleep apnea (adult) (pediatric): Secondary | ICD-10-CM

## 2016-01-03 NOTE — Patient Instructions (Addendum)
Order- schedule sleep study- split NPSG      Dx OSA  We will schedule you back to follow-up once your sleep study is done.  Please try not to smoke

## 2016-01-03 NOTE — Progress Notes (Signed)
01/03/2016-45 year old female smoker   Cant control when or where she falls asleep, wakes up gasping for air, choking sensation in mornings, Snoring, witnessed apnea;  Epworth Score: 21 Complicating medical problems include morbid obesity, depression, chronic pain, tobacco abuse, DM 2, HBP Describes "irresistible" sleepiness since she was 80 or 45 years old. Denies symptoms suggestive of cataplexy or sleep paralysis. Chokes and sleep but doesn't recognize reflux. Aware she is a loud snorer and told she stops breathing. Caffeine has little effect. No sleep medication. Back pain interferes with comfortable sleep.  Prior to Admission medications   Medication Sig Start Date End Date Taking? Authorizing Provider  albuterol (PROVENTIL HFA;VENTOLIN HFA) 108 (90 BASE) MCG/ACT inhaler Inhale 1-2 puffs into the lungs every 6 (six) hours as needed for wheezing or shortness of breath. 06/02/14  Yes Purvis Sheffield, MD   Past Medical History  Diagnosis Date  . Depression   . Arthritis   . Obesity   . Back pain    Past Surgical History  Procedure Laterality Date  . Cholecystectomy    . Tubal ligation     Family History  Problem Relation Age of Onset  . Depression Mother     bipolar/schizo  . Heart disease Mother   . Diabetes Mother   . Asthma Father   . Hyperlipidemia Father   . Hypertension Father   . Depression Maternal Grandmother   . Heart disease Maternal Grandmother   . Diabetes Maternal Grandmother   . Hypertension Maternal Grandfather   . Breast cancer Paternal Aunt    Social History   Social History  . Marital Status: Single    Spouse Name: N/A  . Number of Children: N/A  . Years of Education: N/A   Occupational History  . Not on file.   Social History Main Topics  . Smoking status: Current Every Day Smoker -- 0.50 packs/day for 20 years    Types: Cigarettes  . Smokeless tobacco: Not on file  . Alcohol Use: No  . Drug Use: 1.00 per week    Special: Marijuana   Comment: twice a month  . Sexual Activity: Not Currently    Birth Control/ Protection: Surgical   Other Topics Concern  . Not on file   Social History Narrative   Single, Unemployed, attended some high school. She stays with different friends, no stable home. Public transportation.   ROS-see HPI   Negative unless "+" Constitutional:    weight loss, night sweats, fevers, chills,+ fatigue, lassitude. HEENT:    headaches, difficulty swallowing, tooth/dental problems, sore throat,       sneezing, itching, ear ache, nasal congestion, post nasal drip, snoring CV:    chest pain, orthopnea, PND, swelling in lower extremities, anasarca,                                                       dizziness, palpitations Resp:   shortness of breath with exertion or at rest.                productive cough,   non-productive cough, coughing up of blood.              change in color of mucus.  wheezing.   Skin:    rash or lesions. GI:  No-   heartburn, indigestion, abdominal pain, nausea, vomiting, diarrhea,  change in bowel habits, loss of appetite GU: dysuria, change in color of urine, no urgency or frequency.   flank pain. MS:   joint pain, stiffness, decreased range of motion, back pain. Neuro-     + HPI Psych:  change in mood or affect.  depression or anxiety.   memory loss.  OBJ- Physical Exam General- Alert, Oriented, Affect-appropriate, Distress- none acute, + morbidly obese Skin- rash-none, lesions- none, excoriation- none Lymphadenopathy- none Head- atraumatic            Eyes- Gross vision intact, PERRLA, conjunctivae and secretions clear            Ears- Hearing, canals-normal            Nose- Clear, no-Septal dev, mucus, polyps, erosion, perforation             Throat- Mallampati IV , mucosa clear , drainage- none, tonsils- atrophic Neck- flexible , trachea midline, no stridor , thyroid nl, carotid no bruit Chest - symmetrical excursion , unlabored           Heart/CV- RRR  , no murmur , no gallop  , no rub, nl s1 s2                           - JVD- none , edema- none, stasis changes- none, varices- none           Lung- clear to P&A, wheeze- none, cough- none , dullness-none, rub- none           Chest wall-  Abd-  Br/ Gen/ Rectal- Not done, not indicated Extrem- cyanosis- none, clubbing, none, atrophy- none, strength- nl Neuro- grossly intact to observation

## 2016-01-05 ENCOUNTER — Encounter: Payer: Self-pay | Admitting: Internal Medicine

## 2016-01-05 DIAGNOSIS — G4710 Hypersomnia, unspecified: Secondary | ICD-10-CM | POA: Insufficient documentation

## 2016-01-05 NOTE — Assessment & Plan Note (Signed)
364 pounds. Bariatric intervention can be offered. Serious effort at weight loss advised.

## 2016-01-05 NOTE — Assessment & Plan Note (Signed)
She describes excessive daytime sleepiness back to age 45 or 514, which could be an indication of narcolepsy. However she denies symptoms suggestive of cataplexy or sleep paralysis and she has very strong markers making obstructive sleep apnea and more likely explanation. Plan-sleep study choice mostly based on available insurance coverage.

## 2016-01-06 ENCOUNTER — Ambulatory Visit (HOSPITAL_BASED_OUTPATIENT_CLINIC_OR_DEPARTMENT_OTHER): Payer: Self-pay | Attending: Internal Medicine | Admitting: Internal Medicine

## 2016-01-06 VITALS — Ht 66.0 in | Wt <= 1120 oz

## 2016-01-06 DIAGNOSIS — G4733 Obstructive sleep apnea (adult) (pediatric): Secondary | ICD-10-CM | POA: Insufficient documentation

## 2016-01-09 ENCOUNTER — Encounter: Payer: Self-pay | Admitting: Family Medicine

## 2016-01-09 ENCOUNTER — Ambulatory Visit (INDEPENDENT_AMBULATORY_CARE_PROVIDER_SITE_OTHER): Payer: Self-pay | Admitting: Family Medicine

## 2016-01-09 VITALS — BP 132/79 | HR 58 | Temp 98.2°F | Ht 66.0 in | Wt 363.0 lb

## 2016-01-09 DIAGNOSIS — M25561 Pain in right knee: Secondary | ICD-10-CM

## 2016-01-09 DIAGNOSIS — M25562 Pain in left knee: Secondary | ICD-10-CM

## 2016-01-09 DIAGNOSIS — E1165 Type 2 diabetes mellitus with hyperglycemia: Secondary | ICD-10-CM

## 2016-01-09 DIAGNOSIS — Z720 Tobacco use: Secondary | ICD-10-CM

## 2016-01-09 DIAGNOSIS — G8929 Other chronic pain: Secondary | ICD-10-CM

## 2016-01-09 LAB — POCT GLYCOSYLATED HEMOGLOBIN (HGB A1C): Hemoglobin A1C: 5.8

## 2016-01-09 MED ORDER — BUPROPION HCL ER (XL) 150 MG PO TB24: ORAL_TABLET | ORAL | Status: DC

## 2016-01-09 MED ORDER — METHYLPREDNISOLONE ACETATE 40 MG/ML IJ SUSP
40.0000 mg | Freq: Once | INTRAMUSCULAR | Status: AC
  Administered 2016-01-09: 40 mg via INTRA_ARTICULAR

## 2016-01-09 NOTE — Progress Notes (Signed)
HPI  CC: Back pain, knee pain, and smoking cessation. Patient is here with complaints of chronic low back pain/thoracic back pain, knee pain, and smoking cessation. She states that's pain is constant and relatively unchanged from her baseline. She states that she will occasionally have "flares" that's cause her to be in significantly increased pain and limit her mobility. She denies any extremity weakness, numbness, or paresthesias. She denies any radiation of pain. She denies any injury. No swelling, erythema, or evidence of infection. Knee pain is bilateral and primarily located along the inferior pole of the patella. This is worse when walking downstairs. Patient states that she knows most of this pain is secondary to her excessive weight. She also believes that much of her back pain specifically her thoracic back pain is likely due to her excessively large breasts. Today she is inquiring about the possibility of coverage for breast reduction surgery.  Patient states that she has been trying to quit smoking but is having some difficulty. She would like to explore some options for medication to help with smoking cessation.  Review of Systems   See HPI for ROS. All other systems reviewed and are negative.  CC, SH/smoking status, and VS noted  Objective: BP 132/79 mmHg  Pulse 58  Temp(Src) 98.2 F (36.8 C) (Oral)  Ht 5\' 6"  (1.676 m)  Wt 363 lb (164.656 kg)  BMI 58.62 kg/m2  SpO2 95%  LMP 12/30/2015 Gen: NAD, alert, cooperative, and pleasant. Morbidly obese CV: RRR, no murmur Resp: CTAB, no wheezes, non-labored MSK: No edema, warm, no tenderness over the spinous processes. TTP of the paraspinal region around T10/11 and L4/5, negative straight leg raise, +1 patellar crepitus bilaterally, all 4 ligaments intact bilaterally, bilateral medial joint line tenderness, McMurray's negative, no effusions present, strength intact throughout, range of motion intact throughout, sensation intact  throughout, DTRs +2 bilaterally. Neuro: Alert and oriented, Speech clear, No gross deficits  INJECTION: Intra-articular knees, bilateral Patient was given informed consent, signed copy in the chart. Appropriate time out was taken. Area prepped and draped in usual sterile fashion. 1 cc of methylprednisolone 40 mg/ml plus  4 cc of 1% lidocaine without epinephrine was injected into the right than left knee using a(n) superolateral approach. The patient tolerated the procedure well. There were no complications. Post procedure instructions were given.   Assessment and plan:  Bilateral knee pain Patient complaining of bilateral knee pain consistent with previous complaints of the same issue. Patient had received knee injections in the past which had provided significant pain relief. 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 - Bilateral steroid knee injections. Patient tolerated well. - Long discussion about staying active and techniques for weight loss.  Chronic pain Patient's back pain is accompanied with no evidence of radicular symptoms. No weakness, numbness, or paresthesias. Pain is localized to the paraspinal regions. No evidence of trauma or injury. Pain is likely secondary to morbid obesity. Long discussion about weight loss techniques and staying active was had with the patient. Patient has been working on this and has dropped 20 pounds in the past 11 months. She states that she is staying motivated but would like help dropping the weight faster. Patient also states that she believes her thoracic back pain is likely secondary to her large breasts. She is asking about the possibility of breast reduction surgery but would need help with payment. I encouraged her to contact a university-based hospital for information on programs in which this  type of surgery may be covered as patient does not have insurance/Medicare/Medicaid. I also informed her that if she is  able to find a program which would cover this she would likely need to continue to drop her weight as well as quit smoking. Patient was very open to both of these options and stated her willingness to contact these hospitals and to continue working on weight loss and smoking cessation.  Tobacco abuse Patient interested in smoking cessation. She would also like some help with this. She is very open minded to the possibility of a medication which may help with her cravings. Medication chosen based on both her desire to quit smoking as well as her desire to lose weight. - Wellbutrin XL 150 mg daily 3 days. Then twice a day after that.  DM type 2 (diabetes mellitus, type 2) A1c obtained today. Continues to be dropping. Previous A1c was 6.0. Today's A1c was 5.8.    Orders Placed This Encounter  Procedures  . HgB A1c    Meds ordered this encounter  Medications  . buPROPion (WELLBUTRIN XL) 150 MG 24 hr tablet    Sig: Take 1 tablet ( ) daily for 3 days. Then 1 tablet twice a day after that.    Dispense:  60 tablet    Refill:  3  . methylPREDNISolone acetate (DEPO-MEDROL) injection 40 mg    Sig:   . methylPREDNISolone acetate (DEPO-MEDROL) injection 40 mg    Sig:      Kathee Delton, MD,MS,  PGY2 01/09/2016 11:22 PM

## 2016-01-09 NOTE — Assessment & Plan Note (Signed)
A1c obtained today. Continues to be dropping. Previous A1c was 6.0. Today's A1c was 5.8.

## 2016-01-09 NOTE — Assessment & Plan Note (Signed)
Patient interested in smoking cessation. She would also like some help with this. She is very open minded to the possibility of a medication which may help with her cravings. Medication chosen based on both her desire to quit smoking as well as her desire to lose weight. - Wellbutrin XL 150 mg daily 3 days. Then twice a day after that.

## 2016-01-09 NOTE — Patient Instructions (Signed)
It was a pleasure seeing you today in our clinic. Today we discussed your knee pain, smoking cessation, and weight loss. Here is the treatment plan we have discussed and agreed upon together:   - Today we provided to knee injections. He experience any significant pain/redness/swelling in these joints over the next 48-72 hours and please contact our office immediately. - I also prescribed due to Wellbutrin 150 mg. Take 1 tablet once a day for 3 days. After that take 1 tablet twice a day. I would like to see you back in one month. - You may stop using medial carpal tunnel wrist brace.

## 2016-01-09 NOTE — Assessment & Plan Note (Signed)
Patient's back pain is accompanied with no evidence of radicular symptoms. No weakness, numbness, or paresthesias. Pain is localized to the paraspinal regions. No evidence of trauma or injury. Pain is likely secondary to morbid obesity. Long discussion about weight loss techniques and staying active was had with the patient. Patient has been working on this and has dropped 20 pounds in the past 11 months. She states that she is staying motivated but would like help dropping the weight faster. Patient also states that she believes her thoracic back pain is likely secondary to her large breasts. She is asking about the possibility of breast reduction surgery but would need help with payment. I encouraged her to contact a university-based hospital for information on programs in which this type of surgery may be covered as patient does not have insurance/Medicare/Medicaid. I also informed her that if she is able to find a program which would cover this she would likely need to continue to drop her weight as well as quit smoking. Patient was very open to both of these options and stated her willingness to contact these hospitals and to continue working on weight loss and smoking cessation.

## 2016-01-09 NOTE — Assessment & Plan Note (Signed)
Patient complaining of bilateral knee pain consistent with previous complaints of the same issue. Patient had received knee injections in the past which had provided significant pain relief. 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 - Bilateral steroid knee injections. Patient tolerated well. - Long discussion about staying active and techniques for weight loss.

## 2016-01-12 DIAGNOSIS — G4733 Obstructive sleep apnea (adult) (pediatric): Secondary | ICD-10-CM

## 2016-01-12 NOTE — Procedures (Signed)
  Patient Name: Sandra Orr, [name] Study Date: 01/06/2016 Gender: Female D.O.B: [birth year] Age (years): [age] Referring Provider: Jetty Duhamellinton Young MD, ABSM Height (inches): 66 Interpreting Physician: Jetty Duhamellinton Young MD, ABSM Weight (lbs): 364 RPSGT: Ulyess MortSpruill, Vicki BMI: 59 MRN: [id number] Neck Size: 15.50 CLINICAL INFORMATION Sleep Study Type: NPSG Indication for sleep study: Diabetes, Fatigue, Hypertension, Obesity, OSA Epworth Sleepiness Score: 20  SLEEP STUDY TECHNIQUE As per the AASM Manual for the Scoring of Sleep and Associated Events v2.3 (April 2016) with a hypopnea requiring 4% desaturations. The channels recorded and monitored were frontal, central and occipital EEG, electrooculogram (EOG), submentalis EMG (chin), nasal and oral airflow, thoracic and abdominal wall motion, anterior tibialis EMG, snore microphone, electrocardiogram, and pulse oximetry.  MEDICATIONS Patient's medications include: charted for review. Medications self-administered by patient during sleep study : No sleep medicine administered.  SLEEP ARCHITECTURE The study was initiated at 10:16:33 PM and ended at 4:50:56 AM. Sleep onset time was 8.2 minutes and the sleep efficiency was 81.3%. The total sleep time was 320.5 minutes. Stage REM latency was 91.0 minutes. The patient spent 28.71% of the night in stage N1 sleep, 45.55% in stage N2 sleep, 0.00% in stage N3 and 25.74% in REM. Alpha intrusion was absent. Supine sleep was 75.99%. Wake after sleep onset 65 minutes  RESPIRATORY PARAMETERS The overall apnea/hypopnea index (AHI) was 5.1 per hour. There were 3 total apneas, including 1 obstructive, 2 central and 0 mixed apneas. There were 24 hypopneas and 28 RERAs. The AHI during Stage REM sleep was 13.8 per hour. AHI while supine was 5.4 per hour. The mean oxygen saturation was 95.34%. The minimum SpO2 during sleep was 89.00%. Soft snoring was noted during this study.  CARDIAC DATA The 2 lead EKG  demonstrated sinus rhythm. The mean heart rate was 62.20 beats per minute. Other EKG findings include: None.  LEG MOVEMENT DATA The total PLMS were 47 with a resulting PLMS index of 8.80. Associated arousal with leg movement index was 2.4 .  IMPRESSIONS - Mild obstructive sleep apnea occurred during this study (AHI = 5.1/h). - No significant central sleep apnea occurred during this study (CAI = 0.4/h). - Mild oxygen desaturation was noted during this study (Min O2 = 89.00%). - The patient snored with Soft snoring volume. - No cardiac abnormalities were noted during this study. - Mild periodic limb movements of sleep occurred during the study. No significant associated arousals.  DIAGNOSIS - Obstructive Sleep Apnea (327.23 [G47.33 ICD-10])  RECOMMENDATIONS - Positional therapy avoiding supine position during sleep. - Very mild obstructive sleep apnea. Return to discuss treatment options. - If reported severe daytime sleepiness is not explained by sleep schedule and habits, then consider evaluation for narcolepsy or other causes of primary hypersomnia. - Avoid alcohol, sedatives and other CNS depressants that may worsen sleep apnea and disrupt normal sleep architecture. - Sleep hygiene should be reviewed to assess factors that may improve sleep quality. - Weight management and regular exercise should be initiated or continued if appropriate.  Sandra Orr,CLINTON D Diplomate, American Board of Sleep Medicine  ELECTRONICALLY SIGNED ON:  01/12/2016, 3:10 PM [facility] PH: [phone]   FX: [phone] ACCREDITED BY THE AMERICAN ACADEMY OF SLEEP MEDICINE

## 2016-01-20 ENCOUNTER — Encounter: Payer: Self-pay | Admitting: Internal Medicine

## 2016-01-20 ENCOUNTER — Ambulatory Visit (INDEPENDENT_AMBULATORY_CARE_PROVIDER_SITE_OTHER): Payer: Self-pay | Admitting: Internal Medicine

## 2016-01-20 VITALS — BP 114/80 | HR 88 | Ht 66.0 in | Wt 355.6 lb

## 2016-01-20 DIAGNOSIS — G4710 Hypersomnia, unspecified: Secondary | ICD-10-CM

## 2016-01-20 NOTE — Progress Notes (Signed)
01/03/2016-45 year old female smoker   Cant control when or where she falls asleep, wakes up gasping for air, choking sensation in mornings, Snoring, witnessed apnea;  Epworth Score: 21 Complicating medical problems include morbid obesity, depression, chronic pain, tobacco abuse, DM 2, HBP Describes "irresistible" sleepiness since she was 2013 or 45 years old. Denies symptoms suggestive of cataplexy or sleep paralysis. Chokes and sleep but doesn't recognize reflux. Aware she is a loud snorer and told she stops breathing. Caffeine has little effect. No sleep medication. Back pain interferes with comfortable sleep.  01/20/2016-45 year old female smoker followed for excessive daytime somnolence complicated by morbid obesity, depression, chronic pain, tobacco abuse, DM 2, HBP NPSG 01/06/16- AHI 5.1/ hr, desat to 89%, body wight 364 lbs, 25.4% REM with REM latency 91 minutes. Body weight at this visit 355 pounds, down from 363 pounds on 01/09/2016 Her primary complaint is difficulty maintaining sleep with frequent and prolonged wakings. Her sleep study did show frequent spontaneous waking which she says is typical. I don't get a history of cataplexy or sleep paralysis at this visit. Finances now are quite limiting.  ROS-see HPI   Negative unless "+" Constitutional:    weight loss, night sweats, fevers, chills,+ fatigue, lassitude. HEENT:    headaches, difficulty swallowing, tooth/dental problems, sore throat,       sneezing, itching, ear ache, nasal congestion, post nasal drip, snoring CV:    chest pain, orthopnea, PND, swelling in lower extremities, anasarca,                                                       dizziness, palpitations Resp:   shortness of breath with exertion or at rest.                productive cough,   non-productive cough, coughing up of blood.              change in color of mucus.  wheezing.   Skin:    rash or lesions. GI:  No-   heartburn, indigestion, abdominal pain, nausea,  vomiting, diarrhea,                 change in bowel habits, loss of appetite GU: dysuria, change in color of urine, no urgency or frequency.   flank pain. MS:   joint pain, stiffness, decreased range of motion, back pain. Neuro-     + HPI Psych:  change in mood or affect.  depression or anxiety.   memory loss.  OBJ- Physical Exam General- Alert, Oriented, Affect-appropriate, Distress- none acute, + morbidly obese Skin- rash-none, lesions- none, excoriation- none Lymphadenopathy- none Head- atraumatic            Eyes- Gross vision intact, PERRLA, conjunctivae and secretions clear            Ears- Hearing, canals-normal            Nose- Clear, no-Septal dev, mucus, polyps, erosion, perforation             Throat- Mallampati IV , mucosa clear , drainage- none, tonsils- atrophic Neck- flexible , trachea midline, no stridor , thyroid nl, carotid no bruit Chest - symmetrical excursion , unlabored           Heart/CV- RRR , no murmur , no gallop  , no rub, nl s1 s2                           -  JVD- none , edema- none, stasis changes- none, varices- none           Lung- clear to P&A, wheeze- none, cough- none , dullness-none, rub- none           Chest wall-  Abd-  Br/ Gen/ Rectal- Not done, not indicated Extrem- cyanosis- none, clubbing, none, atrophy- none, strength- nl Neuro- grossly intact to observation

## 2016-01-20 NOTE — Assessment & Plan Note (Signed)
Suspect a combination of poor sleep hygiene and frequent nighttime waking-nonspecific. We're trying to work with her finances. Eventually she may deserve a repeat nighttime sleep study followed the next day by Multiple Sleep Latency Test. I don't get a history of cataplexy or sleep paralysis suggest narcolepsy. We will first try to improve nighttime sleep quality. Plan-educated basic good sleep hygiene. Okay to try Benadryl and melatonin at bedtime for sleep as discussed. Also suggested reflux precautions. She is getting established with [facility]

## 2016-01-20 NOTE — Patient Instructions (Signed)
For better sleep at night- try otc diphenhydramine/ Benadryl 25 mg at bedtime                                                otc melatonin   5-6 mg at bedtime   To avoid reflux and choking at night-  Don't eat or drink for an hour before bedtime  Anything you can do to lose weight will likely help your breathing at night when you are trying to sleep

## 2016-02-14 ENCOUNTER — Ambulatory Visit: Payer: Self-pay | Admitting: Family Medicine

## 2016-02-17 ENCOUNTER — Other Ambulatory Visit: Payer: Self-pay | Admitting: Family Medicine

## 2016-02-17 DIAGNOSIS — Z1231 Encounter for screening mammogram for malignant neoplasm of breast: Secondary | ICD-10-CM

## 2016-02-21 ENCOUNTER — Ambulatory Visit: Payer: Self-pay | Admitting: Family Medicine

## 2016-02-27 ENCOUNTER — Ambulatory Visit: Payer: Self-pay | Admitting: Family Medicine

## 2016-03-12 ENCOUNTER — Ambulatory Visit: Payer: Self-pay | Admitting: Family Medicine

## 2016-03-26 ENCOUNTER — Ambulatory Visit: Payer: Self-pay

## 2016-04-02 NOTE — Telephone Encounter (Signed)
This encounter was created in error - please disregard.

## 2016-04-09 ENCOUNTER — Ambulatory Visit
Admission: RE | Admit: 2016-04-09 | Discharge: 2016-04-09 | Disposition: A | Discharge status: Home / Self Care | Payer: No Typology Code available for payment source | Source: Ambulatory Visit | Attending: Family Medicine | Admitting: Family Medicine

## 2016-04-09 DIAGNOSIS — Z1231 Encounter for screening mammogram for malignant neoplasm of breast: Secondary | ICD-10-CM

## 2016-04-29 ENCOUNTER — Ambulatory Visit: Payer: No Typology Code available for payment source

## 2016-05-08 ENCOUNTER — Ambulatory Visit: Payer: No Typology Code available for payment source | Attending: Internal Medicine

## 2016-05-18 ENCOUNTER — Ambulatory Visit (INDEPENDENT_AMBULATORY_CARE_PROVIDER_SITE_OTHER): Payer: No Typology Code available for payment source | Admitting: Family Medicine

## 2016-05-18 ENCOUNTER — Encounter: Payer: Self-pay | Admitting: Family Medicine

## 2016-05-18 VITALS — BP 150/69 | HR 62 | Temp 98.3°F | Ht 66.0 in | Wt 354.0 lb

## 2016-05-18 DIAGNOSIS — G8929 Other chronic pain: Secondary | ICD-10-CM

## 2016-05-18 DIAGNOSIS — M25561 Pain in right knee: Secondary | ICD-10-CM

## 2016-05-18 DIAGNOSIS — M25562 Pain in left knee: Secondary | ICD-10-CM

## 2016-05-18 MED ORDER — METHYLPREDNISOLONE ACETATE 40 MG/ML IJ SUSP
40.0000 mg | Freq: Once | INTRAMUSCULAR | Status: AC
  Administered 2016-05-18: 40 mg via INTRAMUSCULAR

## 2016-05-18 MED ORDER — NAPROXEN 500 MG PO TABS: 500.0000 mg | ORAL_TABLET | Freq: Two times a day (BID) | ORAL | 5 refills | Status: DC | PRN

## 2016-05-18 MED ORDER — NAPROXEN 500 MG PO TABS: 500.0000 mg | ORAL_TABLET | Freq: Two times a day (BID) | ORAL | 5 refills | Status: DC

## 2016-05-18 NOTE — Assessment & Plan Note (Signed)
Patient is here with chronic bilateral knee pain. 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 - Bilateral intra-articular corticosteroid injections provided. Patient tolerated well. - We discussed the fact that these injections are only temporary and did not solve the problem. We discussed the importance of weight loss as both a treatment for her knee pain as well as a important requirement if consideration for total knee arthroplasty is something she would consider as the pain/arthritis progresses.

## 2016-05-18 NOTE — Patient Instructions (Addendum)
It was a pleasure seeing you today in our clinic. Today we discussed your knee and back pain. Here is the treatment plan we have discussed and agreed upon together:   - Today we provided you bilateral knee injections. My hope is that this will provide at least 3 months of pain relief. He will notice some improvement immediately after the injections today. After a period of time this will wear off and should be back to your normal pain later tonight. The second medication in this injection will take effect in 3-5 days. - Do not submerge your knees under water in a bath/hot tub/pool for the next 24-48 hours. Showers are okay. - I provided you a prescription for naproxen. Take 1 tablet twice a day as needed for back/knee pain. Make sure to only take this medication with food. - Weight loss is going to be the most important aspect of your treatment. Doing your best to eat healthy and control your portion sizes is important. Unfortunately the end again for your knees will likely be a complete joint replacement. In order to do this you must lose a significant amount of weight before the surgeons well feel comfortable operating. - Listed below are some details of the Mediterranean diet. Please look through this information for ideas for diet changes he may be willing to make.      Why follow it? Research shows. . Those who follow the Mediterranean diet have a reduced risk of heart disease  . The diet is associated with a reduced incidence of Parkinson's and Alzheimer's diseases . People following the diet may have longer life expectancies and lower rates of chronic diseases  . The Dietary Guidelines for Americans recommends the Mediterranean diet as an eating plan to promote health and prevent disease  What Is the Mediterranean Diet?  . Healthy eating plan based on typical foods and recipes of Mediterranean-style cooking . The diet is primarily a plant based diet; these foods should make up a majority of  meals   Starches - Plant based foods should make up a majority of meals - They are an important sources of vitamins, minerals, energy, antioxidants, and fiber - Choose whole grains, foods high in fiber and minimally processed items  - Typical grain sources include wheat, oats, barley, corn, brown rice, bulgar, farro, millet, polenta, couscous  - Various types of beans include chickpeas, lentils, fava beans, black beans, white beans   Fruits  Veggies - Large quantities of antioxidant rich fruits & veggies; 6 or more servings  - Vegetables can be eaten raw or lightly drizzled with oil and cooked  - Vegetables common to the traditional Mediterranean Diet include: artichokes, arugula, beets, broccoli, brussel sprouts, cabbage, carrots, celery, collard greens, cucumbers, eggplant, kale, leeks, lemons, lettuce, mushrooms, okra, onions, peas, peppers, potatoes, pumpkin, radishes, rutabaga, shallots, spinach, sweet potatoes, turnips, zucchini - Fruits common to the Mediterranean Diet include: apples, apricots, avocados, cherries, clementines, dates, figs, grapefruits, grapes, melons, nectarines, oranges, peaches, pears, pomegranates, strawberries, tangerines  Fats - Replace butter and margarine with healthy oils, such as olive oil, canola oil, and tahini  - Limit nuts to no more than a handful a day  - Nuts include walnuts, almonds, pecans, pistachios, pine nuts  - Limit or avoid candied, honey roasted or heavily salted nuts - Olives are central to the Mediterranean diet - can be eaten whole or used in a variety of dishes   Meats Protein - Limiting red meat: no more than a few  times a month - When eating red meat: choose lean cuts and keep the portion to the size of deck of cards - Eggs: approx. 0 to 4 times a week  - Fish and lean poultry: at least 2 a week  - Healthy protein sources include, chicken, [place], lean beef, lamb - Increase intake of seafood such as tuna, salmon, trout, mackerel, shrimp,  scallops - Avoid or limit high fat processed meats such as sausage and bacon  Dairy - Include moderate amounts of low fat dairy products  - Focus on healthy dairy such as fat free yogurt, skim milk, low or reduced fat cheese - Limit dairy products higher in fat such as whole or 2% milk, cheese, ice cream  Alcohol - Moderate amounts of red wine is ok  - No more than 5 oz daily for women (all ages) and men older than age 45  - No more than 10 oz of wine daily for men younger than 1865  Other - Limit sweets and other desserts  - Use herbs and spices instead of salt to flavor foods  - Herbs and spices common to the traditional Mediterranean Diet include: basil, bay leaves, chives, cloves, cumin, fennel, garlic, lavender, marjoram, mint, oregano, parsley, pepper, rosemary, sage, savory, sumac, tarragon, thyme   It's not just a diet, it's a lifestyle:  . The Mediterranean diet includes lifestyle factors typical of those in the region  . Foods, drinks and meals are best eaten with others and savored . Daily physical activity is important for overall good health . This could be strenuous exercise like running and aerobics . This could also be more leisurely activities such as walking, housework, yard-work, or taking the stairs . Moderation is the key; a balanced and healthy diet accommodates most foods and drinks . Consider portion sizes and frequency of consumption of certain foods   Meal Ideas & Options:  . Breakfast:  o Whole wheat toast or whole wheat English muffins with peanut butter & hard boiled egg o Steel cut oats topped with apples & cinnamon and skim milk  o Fresh fruit: banana, strawberries, melon, berries, peaches  o Smoothies: strawberries, bananas, greek yogurt, peanut butter o Low fat greek yogurt with blueberries and granola  o Egg white omelet with spinach and mushrooms o Breakfast couscous: whole wheat couscous, apricots, skim milk, cranberries  . Sandwiches:  o Hummus and  grilled vegetables (peppers, zucchini, squash) on whole wheat bread   o Grilled chicken on whole wheat pita with lettuce, tomatoes, cucumbers or tzatziki  o Tuna salad on whole wheat bread: tuna salad made with greek yogurt, olives, red peppers, capers, green onions o Garlic rosemary lamb pita: lamb sauted with garlic, rosemary, salt & pepper; add lettuce, cucumber, greek yogurt to pita - flavor with lemon juice and black pepper  . Seafood:  o Mediterranean grilled salmon, seasoned with garlic, basil, parsley, lemon juice and black pepper o Shrimp, lemon, and spinach whole-grain pasta salad made with low fat greek yogurt  o Seared scallops with lemon orzo  o Seared tuna steaks seasoned salt, pepper, coriander topped with tomato mixture of olives, tomatoes, olive oil, minced garlic, parsley, green onions and cappers  . Meats:  o Herbed greek chicken salad with kalamata olives, cucumber, feta  o Red bell peppers stuffed with spinach, bulgur, lean ground beef (or lentils) & topped with feta   o Kebabs: skewers of chicken, tomatoes, onions, zucchini, squash  o [place] burgers: made with red onions, mint,  dill, lemon juice, feta cheese topped with roasted red peppers . Vegetarian o Cucumber salad: cucumbers, artichoke hearts, celery, red onion, feta cheese, tossed in olive oil & lemon juice  o Hummus and whole grain pita points with a greek salad (lettuce, tomato, feta, olives, cucumbers, red onion) o Lentil soup with celery, carrots made with vegetable broth, garlic, salt and pepper  o Tabouli salad: parsley, bulgur, mint, scallions, cucumbers, tomato, radishes, lemon juice, olive oil, salt and pepper.      

## 2016-05-18 NOTE — Progress Notes (Signed)
HPI  CC: Bilateral knees: anteromedial Patient is here to discuss her bilateral knee pain. The pain is chronic in nature and has been addressed at previous visits. Pain is relatively unchanged from previous. She states that pain is located to the anterior medial aspect of both knees. Pain is worse with prolonged standing or walking. No previous injury. No significant swelling. No popping clicking or catching. Pain has been progressively worsening over the past few years. Previous corticosteroid injections have helped in the past. Last injection was in May of this year.  Chronic back pain: Patient is here also to discuss her low back pain. Pain is chronic in nature and does not radiate down her legs. And is located to her lumbar spine. Pain is worse with prolonged standing or walking. She denies any limitations in range of motion. No numbness, paresthesias, weakness, or bowel/bladder incontinence. Patient states that she has not been taking any medications for this pain. This pain makes daily activity fairly difficult.  Review of Systems   See HPI for ROS. All other systems reviewed and are negative.  CC, SH/smoking status, and VS noted  Objective: BP (!) 150/69   Pulse 62   Temp 98.3 F (36.8 C) (Oral)   Ht 5\' 6"  (1.676 m)   Wt (!) 354 lb (160.6 kg)   LMP 05/06/2016   BMI 57.14 kg/m  Gen: NAD, alert, cooperative, and pleasant. HEENT: NCAT, EOMI, PERRL CV: RRR, no murmur Resp: CTAB, no wheezes, non-labored MSK: No edema, warm, no tenderness over the spinous processes. TTP of the paraspinal region around T10/11 and L4/5, negative straight leg raise, +1 patellar crepitus bilaterally, all 4 ligaments intact bilaterally, bilateral medial joint line tenderness, McMurray's negative, no effusions present, strength intact throughout, range of motion intact throughout, sensation intact throughout, DTRs +2 bilaterally.  INJECTION: Bilateral Knees Patient was given informed consent, signed copy  in the chart. Appropriate time out was taken. Area prepped and draped in usual sterile fashion. 1 cc of methylprednisolone 40 mg/ml plus  4 cc of 1% lidocaine without epinephrine was injected into the knees bilaterally using a(n) inferiomedial approach (for both sides). The patient tolerated the procedure well. There were no complications. Post procedure instructions were given.   Assessment and plan:  Bilateral knee pain Patient is here with chronic bilateral knee pain. 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 - Bilateral intra-articular corticosteroid injections provided. Patient tolerated well. - We discussed the fact that these injections are only temporary and did not solve the problem. We discussed the importance of weight loss as both a treatment for her knee pain as well as a important requirement if consideration for total knee arthroplasty is something she would consider as the pain/arthritis progresses.  Chronic pain Primarily pain is localized along the low back. Etiology likely degenerative disc. Negative straight leg raise. Negative red flag symptoms. - Discussed weight loss as above - Naproxen twice a day with food. - Strength and exercise - Discussed physical therapy, patient does not have the financial means at this time.   Meds ordered this encounter  Medications  . DISCONTD: naproxen (NAPROSYN) 500 MG tablet    Sig: Take 1 tablet (500 mg total) by mouth 2 (two) times daily with a meal.    Dispense:  60 tablet    Refill:  5  . naproxen (NAPROSYN) 500 MG tablet    Sig: Take 1 tablet (500 mg total) by mouth 2 (two) times daily as needed  for moderate pain.    Dispense:  60 tablet    Refill:  5  . methylPREDNISolone acetate (DEPO-MEDROL) injection 40 mg  . methylPREDNISolone acetate (DEPO-MEDROL) injection 40 mg     Kathee DeltonIan D McKeag, MD,MS,  PGY3 05/18/2016 8:20 PM

## 2016-05-18 NOTE — Assessment & Plan Note (Signed)
Primarily pain is localized along the low back. Etiology likely degenerative disc. Negative straight leg raise. Negative red flag symptoms. - Discussed weight loss as above - Naproxen twice a day with food. - Strength and exercise - Discussed physical therapy, patient does not have the financial means at this time.

## 2016-06-23 ENCOUNTER — Emergency Department (HOSPITAL_COMMUNITY)
Admission: EM | Admit: 2016-06-23 | Discharge: 2016-06-23 | Disposition: A | Discharge status: Home / Self Care | Payer: No Typology Code available for payment source | Attending: Emergency Medicine | Admitting: Emergency Medicine

## 2016-06-23 ENCOUNTER — Encounter (HOSPITAL_COMMUNITY): Payer: Self-pay | Admitting: Emergency Medicine

## 2016-06-23 ENCOUNTER — Emergency Department (HOSPITAL_COMMUNITY): Payer: No Typology Code available for payment source

## 2016-06-23 DIAGNOSIS — I10 Essential (primary) hypertension: Secondary | ICD-10-CM | POA: Insufficient documentation

## 2016-06-23 DIAGNOSIS — Z79899 Other long term (current) drug therapy: Secondary | ICD-10-CM | POA: Insufficient documentation

## 2016-06-23 DIAGNOSIS — R109 Unspecified abdominal pain: Secondary | ICD-10-CM

## 2016-06-23 DIAGNOSIS — K602 Anal fissure, unspecified: Secondary | ICD-10-CM

## 2016-06-23 DIAGNOSIS — F17210 Nicotine dependence, cigarettes, uncomplicated: Secondary | ICD-10-CM | POA: Insufficient documentation

## 2016-06-23 DIAGNOSIS — E119 Type 2 diabetes mellitus without complications: Secondary | ICD-10-CM | POA: Insufficient documentation

## 2016-06-23 LAB — COMPREHENSIVE METABOLIC PANEL
ALBUMIN: 4.1 g/dL (ref 3.5–5.0)
ALK PHOS: 76 U/L (ref 38–126)
ALT: 14 U/L (ref 14–54)
ANION GAP: 6 (ref 5–15)
AST: 17 U/L (ref 15–41)
BUN: 9 mg/dL (ref 6–20)
CALCIUM: 9.2 mg/dL (ref 8.9–10.3)
CO2: 26 mmol/L (ref 22–32)
CREATININE: 0.75 mg/dL (ref 0.44–1.00)
Chloride: 102 mmol/L (ref 101–111)
GFR calc Af Amer: >60 mL/min (ref >60)
GFR calc non Af Amer: >60 mL/min (ref >60)
GLUCOSE: 93 mg/dL (ref 65–99)
Potassium: 3.6 mmol/L (ref 3.5–5.1)
SODIUM: 134 mmol/L — AB (ref 135–145)
Total Bilirubin: 0.5 mg/dL (ref 0.3–1.2)
Total Protein: 7.7 g/dL (ref 6.5–8.1)

## 2016-06-23 LAB — URINALYSIS, ROUTINE W REFLEX MICROSCOPIC
BILIRUBIN URINE: NEGATIVE
Glucose, UA: NEGATIVE mg/dL
KETONES UR: NEGATIVE mg/dL
LEUKOCYTES UA: NEGATIVE
NITRITE: NEGATIVE
PH: 5.5 (ref 5.0–8.0)
PROTEIN: NEGATIVE mg/dL
Specific Gravity, Urine: 1.02 (ref 1.005–1.030)

## 2016-06-23 LAB — CBC WITH DIFFERENTIAL/PLATELET
Basophils Absolute: 0 10*3/uL (ref 0.0–0.1)
Basophils Relative: 0 %
EOS ABS: 0.2 10*3/uL (ref 0.0–0.7)
Eosinophils Relative: 1 %
HCT: 41.8 % (ref 36.0–46.0)
HEMOGLOBIN: 13.7 g/dL (ref 12.0–15.0)
LYMPHS ABS: 4.3 10*3/uL — AB (ref 0.7–4.0)
Lymphocytes Relative: 34 %
MCH: 29.7 pg (ref 26.0–34.0)
MCHC: 32.8 g/dL (ref 30.0–36.0)
MCV: 90.7 fL (ref 78.0–100.0)
MONOS PCT: 6 %
Monocytes Absolute: 0.7 10*3/uL (ref 0.1–1.0)
NEUTROS PCT: 59 %
Neutro Abs: 7.3 10*3/uL (ref 1.7–7.7)
Platelets: 235 10*3/uL (ref 150–400)
RBC: 4.61 MIL/uL (ref 3.87–5.11)
RDW: 14.9 % (ref 11.5–15.5)
WBC: 12.4 10*3/uL — ABNORMAL HIGH (ref 4.0–10.5)

## 2016-06-23 LAB — PREGNANCY, URINE: Preg Test, Ur: NEGATIVE

## 2016-06-23 LAB — URINE MICROSCOPIC-ADD ON

## 2016-06-23 LAB — LIPASE, BLOOD: Lipase: 18 U/L (ref 11–51)

## 2016-06-23 MED ORDER — DICYCLOMINE HCL 10 MG/ML IM SOLN
20.0000 mg | Freq: Once | INTRAMUSCULAR | Status: AC
  Administered 2016-06-23: 20 mg via INTRAMUSCULAR
  Filled 2016-06-23: qty 2

## 2016-06-23 MED ORDER — IOPAMIDOL (ISOVUE-300) INJECTION 61%
120.0000 mL | Freq: Once | INTRAVENOUS | Status: AC | PRN
  Administered 2016-06-23: 15:00:00 via INTRAVENOUS

## 2016-06-23 MED ORDER — DICYCLOMINE HCL 20 MG PO TABS: 20.0000 mg | ORAL_TABLET | Freq: Four times a day (QID) | ORAL | 0 refills | Status: DC | PRN

## 2016-06-23 MED ORDER — IOPAMIDOL (ISOVUE-300) INJECTION 61%
INTRAVENOUS | Status: AC
  Administered 2016-06-23: 30 mL
  Filled 2016-06-23: qty 30

## 2016-06-23 MED ORDER — ONDANSETRON HCL 4 MG PO TABS: 4.0000 mg | ORAL_TABLET | Freq: Three times a day (TID) | ORAL | 0 refills | Status: DC | PRN

## 2016-06-23 NOTE — ED Provider Notes (Signed)
[facility] DEPT Provider Note   CSN: [id number] Arrival date & time: 06/23/16  1215     History   Chief Complaint Chief Complaint  Patient presents with  . Rectal Bleeding  . Abdominal Pain    HPI Sandra Orr is a 45 y.o. female.   Rectal Bleeding  Abdominal Pain    Pt was seen at 1345.  Per pt, c/o gradual onset and persistence of constant generalized abd "pain" for the past 3 days.  Has been associated with nausea.  Describes the abd pain as "sore" and "cramping." States she had a BM today and noticed "bright red blood" on the toilet paper when she wiped afterwards.  Denies vomiting/diarrhea, no fevers, no back pain, no rash, no CP/SOB, no black stools.     Past Medical History:  Diagnosis Date  . Arthritis   . Back pain   . Depression   . Obesity     Patient Active Problem List   Diagnosis Date Noted  . 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 Abdominal pain 10/18/2015  . Constipation 10/18/2015  . Rash and nonspecific skin eruption 10/18/2015  . Bilateral knee pain 07/15/2015  . Carpal tunnel syndrome 07/15/2015  . Homelessness 05/30/2015  . Essential hypertension, benign 05/30/2015  . DM type 2 (diabetes mellitus, type 2) (HCC) 02/04/2015  . Morbid obesity (HCC) 05/04/2012  . Depression 05/04/2012  . Chronic pain 05/04/2012  . Tobacco abuse 05/04/2012    Past Surgical History:  Procedure Laterality Date  . CHOLECYSTECTOMY    . TUBAL LIGATION      OB History    Gravida Para Term Preterm AB Living   3 3 3     3    SAB TAB Ectopic Multiple Live Births                   Home Medications    Prior to Admission medications   Medication Sig Start Date End Date Taking? Authorizing Provider  albuterol (PROVENTIL HFA;VENTOLIN HFA) 108 (90 BASE) MCG/ACT inhaler Inhale 1-2 puffs into the lungs every 6 (six) hours as needed for wheezing or shortness of breath. 06/02/14  Yes Purvis SheffieldForrest Harrison, MD  naproxen (NAPROSYN) 500 MG tablet Take 1 tablet  (500 mg total) by mouth 2 (two) times daily as needed for moderate pain. 05/18/16  Yes Kathee DeltonIan D McKeag, MD  buPROPion (WELLBUTRIN XL) 150 MG 24 hr tablet Take 1 tablet (150mg ) daily for 3 days. Then 1 tablet twice a day after that. Patient not taking: Reported on 06/23/2016 01/09/16   Kathee DeltonIan D McKeag, MD    Family History Family History  Problem Relation Age of Onset  . Depression Mother     bipolar/schizo  . Heart disease Mother   . Diabetes Mother   . Asthma Father   . Hyperlipidemia Father   . Hypertension Father   . Depression Maternal Grandmother   . Heart disease Maternal Grandmother   . Diabetes Maternal Grandmother   . Hypertension Maternal Grandfather   . Breast cancer Paternal Aunt     Social History Social History  Substance Use Topics  . Smoking status: Current Every Day Smoker    Packs/day: 0.50    Years: 20.00    Types: Cigarettes  . Smokeless tobacco: Never Used  . Alcohol use No     Allergies   Penicillins   Review of Systems Review of SystemsROS: Statement: All systems negative except as marked or noted in the HPI; Constitutional: Negative for fever and chills. ; ;  Eyes: Negative for eye pain, redness and discharge. ; ; ENMT: Negative for ear pain, hoarseness, nasal congestion, sinus pressure and sore throat. ; ; Cardiovascular: Negative for chest pain, palpitations, diaphoresis, dyspnea and peripheral edema. ; ; Respiratory: Negative for cough, wheezing and stridor. ; ; Gastrointestinal: Negative for vomiting, diarrhea, hematemesis, jaundice and +abd pain, nausea, rectal bleeding. . ; ; Genitourinary: Negative for dysuria, flank pain and hematuria. ; ; Musculoskeletal: Negative for back pain and neck pain. Negative for swelling and trauma.; ; Skin: Negative for pruritus, rash, abrasions, blisters, bruising and skin lesion.; ; Neuro: Negative for headache, lightheadedness and neck stiffness. Negative for weakness, altered level of consciousness, altered mental status,  extremity weakness, paresthesias, involuntary movement, seizure and syncope.       Physical Exam Updated Vital Signs BP (!) 119/48 (BP Location: Left Arm)   Pulse 71   Temp 97.9 F (36.6 C) (Oral)   Resp 18   Ht 5\' 4"  (1.626 m)   Wt (!) 370 lb (167.8 kg)   SpO2 99%   BMI 63.51 kg/m   Physical Exam 1350: Physical examination:  Nursing notes reviewed; Vital signs and O2 SAT reviewed;  Constitutional: Well developed, Well nourished, Well hydrated, In no acute distress; Head:  Normocephalic, atraumatic; Eyes: EOMI, PERRL, No scleral icterus; ENMT: Mouth and pharynx normal, Mucous membranes moist; Neck: Supple, Full range of motion, No lymphadenopathy; Cardiovascular: Regular rate and rhythm, No gallop; Respiratory: Breath sounds clear & equal bilaterally, No wheezes.  Speaking full sentences with ease, Normal respiratory effort/excursion; Chest: Nontender, Movement normal; Abdomen: Soft, +diffuse tenderness to palp. Nondistended, Normal bowel sounds. Rectal exam performed w/permission of pt and ED Tech chaperone present.  Anal tone normal.  +tender anal fissure without active bleeding. +external hemorrhoid without thrombosis or bleeding..; Genitourinary: No CVA tenderness; Extremities: Pulses normal, No tenderness, No edema, No calf edema or asymmetry.; Neuro: AA&Ox3, Major CN grossly intact.  Speech clear. No gross focal motor or sensory deficits in extremities. Climbs on and off stretcher easily by herself. Gait steady.; Skin: Color normal, Warm, Dry.   ED Treatments / Results  Labs (all labs ordered are listed, but only abnormal results are displayed)   EKG  EKG Interpretation None       Radiology   Procedures Procedures (including critical care time)  Medications Ordered in ED Medications  dicyclomine (BENTYL) injection 20 mg (not administered)  iopamidol (ISOVUE-300) 61 % injection (not administered)     Initial Impression / Assessment and Plan / ED Course  I have  reviewed the triage vital signs and the nursing notes.  Pertinent labs & imaging results that were available during my care of the patient were reviewed by me and considered in my medical decision making (see chart for details).  MDM Reviewed: previous chart, nursing note and vitals Reviewed previous: labs Interpretation: labs and CT scan   Results for orders placed or performed during the hospital encounter of 06/23/16  Urinalysis, Routine w reflex microscopic (not at [facility])  Result Value Ref Range   Color, Urine YELLOW YELLOW   APPearance CLEAR CLEAR   Specific Gravity, Urine 1.020 1.005 - 1.030   pH 5.5 5.0 - 8.0   Glucose, UA NEGATIVE NEGATIVE mg/dL   Hgb urine dipstick TRACE (A) NEGATIVE   Bilirubin Urine NEGATIVE NEGATIVE   Ketones, ur NEGATIVE NEGATIVE mg/dL   Protein, ur NEGATIVE NEGATIVE mg/dL   Nitrite NEGATIVE NEGATIVE   Leukocytes, UA NEGATIVE NEGATIVE  Urine microscopic-add on  Result Value Ref  Range   Squamous Epithelial / LPF 0-5 (A) NONE SEEN   WBC, UA 0-5 0 - 5 WBC/hpf   RBC / HPF 0-5 0 - 5 RBC/hpf   Bacteria, UA RARE (A) NONE SEEN  Pregnancy, urine  Result Value Ref Range   Preg Test, Ur NEGATIVE NEGATIVE  Comprehensive metabolic panel  Result Value Ref Range   Sodium 134 (L) 135 - 145 mmol/L   Potassium 3.6 3.5 - 5.1 mmol/L   Chloride 102 101 - 111 mmol/L   CO2 26 22 - 32 mmol/L   Glucose, Bld 93 65 - 99 mg/dL   BUN 9 6 - 20 mg/dL   Creatinine, Ser 1.610.75 0.44 - 1.00 mg/dL   Calcium 9.2 8.9 - 09.610.3 mg/dL   Total Protein 7.7 6.5 - 8.1 g/dL   Albumin 4.1 3.5 - 5.0 g/dL   AST 17 15 - 41 U/L   ALT 14 14 - 54 U/L   Alkaline Phosphatase 76 38 - 126 U/L   Total Bilirubin 0.5 0.3 - 1.2 mg/dL   GFR calc non Af Amer >60 >60 mL/min   GFR calc Af Amer >60 >60 mL/min   Anion gap 6 5 - 15  Lipase, blood  Result Value Ref Range   Lipase 18 11 - 51 U/L  CBC with Differential  Result Value Ref Range   WBC 12.4 (H) 4.0 - 10.5 K/uL   RBC 4.61 3.87 - 5.11 MIL/uL     Hemoglobin 13.7 12.0 - 15.0 g/dL   HCT 04.541.8 40.936.0 - 81.146.0 %   MCV 90.7 78.0 - 100.0 fL   MCH 29.7 26.0 - 34.0 pg   MCHC 32.8 30.0 - 36.0 g/dL   RDW 91.414.9 78.211.5 - 95.615.5 %   Platelets 235 150 - 400 K/uL   Neutrophils Relative % 59 %   Neutro Abs 7.3 1.7 - 7.7 K/uL   Lymphocytes Relative 34 %   Lymphs Abs 4.3 (H) 0.7 - 4.0 K/uL   Monocytes Relative 6 %   Monocytes Absolute 0.7 0.1 - 1.0 K/uL   Eosinophils Relative 1 %   Eosinophils Absolute 0.2 0.0 - 0.7 K/uL   Basophils Relative 0 %   Basophils Absolute 0.0 0.0 - 0.1 K/uL   Ct Abdomen Pelvis W Contrast Result Date: 06/23/2016 CLINICAL DATA:  Abdominal pain and nausea for 4 days EXAM: CT ABDOMEN AND PELVIS WITH CONTRAST TECHNIQUE: Multidetector CT imaging of the abdomen and pelvis was performed using the standard protocol following bolus administration of intravenous contrast. Oral contrast was also administered. CONTRAST:  120 mL ISOVUE-300 IOPAMIDOL (ISOVUE-300) INJECTION 61% COMPARISON:  July 05, 2013 FINDINGS: Lower chest: Lung bases are clear. Hepatobiliary: Liver measures 21.5 cm in length. No focal liver lesions are evident. Gallbladder is absent. There is no biliary duct dilatation. Pancreas: There is no pancreatic mass or inflammatory focus. Spleen: No splenic lesions are evident. Adrenals/Urinary Tract: Adrenals appear normal bilaterally. Kidneys bilaterally show no evident mass or hydronephrosis on either side. There is no renal or ureteral calculus on either side. Urinary bladder is midline with wall thickness within normal limits. Stomach/Bowel: There are scattered sigmoid diverticula without diverticulitis. There is no appreciable bowel wall or mesenteric thickening. There is no bowel obstruction. No free air or portal venous air. Vascular/Lymphatic: There is a slight degree of atherosclerotic calcification in the aorta. There is no abdominal aortic aneurysm. There is mild calcification in the right and left common iliac arteries. The  major mesenteric vessels appear patent. There is  no adenopathy in the abdomen or pelvis. Reproductive: Uterus is anteverted. There is no pelvic mass or pelvic fluid collection. Other: Appendix appears normal. No ascites or abscess is evident in the abdomen or pelvis. There is a minimal ventral hernia containing only fat. Musculoskeletal: There are no blastic or lytic bone lesions. There is no intramuscular or abdominal wall lesion. IMPRESSION: A cause for patient's symptoms has not been established with this study. There is no bowel wall thickening or bowel obstruction. No abscess. Appendix appears normal. There are scattered sigmoid diverticula without diverticulitis. Liver is prominent without focal lesion.  Gallbladder absent. No renal or ureteral calculus. No hydronephrosis. There is a minimal ventral hernia containing only fat. There is mild aortoiliac atherosclerosis. Electronically Signed   By: Bretta Bang III M.D.   On: 06/23/2016 15:51    1610:  Workup reassuring. Pt has tol PO well while in the ED without N/V.  No stooling while in the ED.  Abd benign, VSS. Tx symptomatically at this time. Feels better and wants to go home now. Dx and testing d/w pt.  Questions answered.  Verb understanding, agreeable to d/c home with outpt f/u.      Final Clinical Impressions(s) / ED Diagnoses   Final diagnoses:  None    New Prescriptions New Prescriptions   No medications on file      Samuel Jester, DO 06/27/16 2134

## 2016-06-23 NOTE — ED Triage Notes (Signed)
PT c/o abdominal pain with nausea starting x4 days. PT states no appetite and denies any urinary symptoms. PT states she had a BM this am and noticed bright red blood on the toilet paper after she got done using the bathroom.

## 2016-06-23 NOTE — Discharge Instructions (Signed)
Take the prescriptions as directed.  Call your regular medical doctor tomorrow to schedule a follow up appointment within the next week.  Return to the Emergency Department immediately if worsening.

## 2016-07-02 ENCOUNTER — Ambulatory Visit: Payer: No Typology Code available for payment source | Admitting: Family Medicine

## 2016-07-06 ENCOUNTER — Ambulatory Visit: Payer: No Typology Code available for payment source | Admitting: Family Medicine

## 2016-08-03 ENCOUNTER — Ambulatory Visit (INDEPENDENT_AMBULATORY_CARE_PROVIDER_SITE_OTHER): Payer: No Typology Code available for payment source | Admitting: Family Medicine

## 2016-08-03 VITALS — BP 115/70 | HR 56 | Temp 98.0°F | Wt 308.6 lb

## 2016-08-03 DIAGNOSIS — G8929 Other chronic pain: Secondary | ICD-10-CM | POA: Insufficient documentation

## 2016-08-03 DIAGNOSIS — M25562 Pain in left knee: Secondary | ICD-10-CM

## 2016-08-03 MED ORDER — METHYLPREDNISOLONE ACETATE 80 MG/ML IJ SUSP
80.0000 mg | Freq: Once | INTRAMUSCULAR | Status: AC
  Administered 2016-08-03: 40 mg via INTRAMUSCULAR

## 2016-08-03 MED ORDER — DICLOFENAC POTASSIUM 50 MG PO TABS: 50.0000 mg | ORAL_TABLET | Freq: Two times a day (BID) | ORAL | 2 refills | Status: DC

## 2016-08-03 NOTE — Assessment & Plan Note (Signed)
Improving: It appears as though patient has lost approximately 45-50 pounds since her last visit with me 2 months ago. She states that she has been trying to lose this weight by his being more active and watching what she eats. She believes that most of this is due to her increased activity and she states that she feels "so much better" over the past couple months. - Continued encouragement and positive reinforcement provided

## 2016-08-03 NOTE — Patient Instructions (Signed)
It was a pleasure seeing you today in our clinic. Today we discussed your knee pain. Here is the treatment plan we have discussed and agreed upon together:   - We prescribed a new anti-inflammatory called Voltaren. Take 1 tablet 2-3 times a day as needed for knee pain. - You can take up to 3000 mg of Tylenol a day in addition to this Voltaren for your knee pain. - Stay well-hydrated on this medication. - Continue losing weight as this is the #1 most important treatment for your knee pain. - Come back and see me anytime for this knee pain.

## 2016-08-03 NOTE — Progress Notes (Signed)
   HPI  CC: Left knee pain Patient is here for worsening left knee pain. She states that she has not had a new injury or trauma or fall. She states that over the past 2-3 days she has had acute worsening in this left knee. Pain is similar to the pain she has had in the past. She has some issues with weightbearing but she says that she is able to walk if she has to. No acute warmth or erythema. She endorses some occasional popping of her knees bilaterally but without mechanical locking. She denies any recent fever, chills, nausea, vomiting, diarrhea, headache, numbness, weakness, or paresthesias.  Patient has been taking her prescribed naproxen but states that it is "not doing much".  Review of Systems    See HPI for ROS. All other systems reviewed and are negative.  CC, SH/smoking status, and VS noted  Objective: BP 115/70 (BP Location: Right Arm, Patient Position: Sitting, Cuff Size: Large)   Pulse (!) 56   Temp 98 F (36.7 C) (Oral)   Wt (!) 308 lb 9.6 oz (140 kg)   SpO2 99%   BMI 52.97 kg/m  Gen: NAD, alert, cooperative, and pleasant. CV: RRR, no murmur Resp: CTAB, no wheezes, non-labored Neuro: Alert and oriented, Speech clear, No gross deficits MSK: No edema, warm, +1 patellar crepitus bilaterally, all 4 ligaments intact bilaterally, bilateral medial joint line tenderness (L>R), McMurray's negative, no effusions present, strength intact throughout, range of motion intact throughout, sensation intact throughout  INJECTION: Knee, Left Patient was given informed consent, signed copy in the chart. Appropriate time out was taken. Area prepped and draped in usual sterile fashion. 1 cc of methylprednisolone 40 mg/ml plus 4 cc of 1% lidocaine without epinephrine was injected into the knee (left) using a(n) inferiomedial approach. The patient tolerated the procedure well. There were no complications. Post procedure instructions were given.  Assessment and plan:  Chronic pain of left  knee Patient is here with worsening left-sided knee pain. He is a history of bilateral knee osteoarthritis. She states she has been trying to be more active recently and recently has been inhibited due to this significant pain. No warmth, erythema, or effusion suggesting gout or septic joint. - Intra-articular injection provided (although it was discussed with patient that I normally like to wait until at least 3 months after the last injection, patient stated her understanding), patient tolerated well. - Discontinued naproxen as patient states that this was not helping. - Initiate diclofenac  Morbid obesity Improving: It appears as though patient has lost approximately 45-50 pounds since her last visit with me 2 months ago. She states that she has been trying to lose this weight by his being more active and watching what she eats. She believes that most of this is due to her increased activity and she states that she feels "so much better" over the past couple months. - Continued encouragement and positive reinforcement provided   Meds ordered this encounter  Medications  . diclofenac (CATAFLAM) 50 MG tablet    Sig: Take 1 tablet (50 mg total) by mouth 2 (two) times daily.    Dispense:  60 tablet    Refill:  2  . methylPREDNISolone acetate (DEPO-MEDROL) injection 80 mg     Kathee DeltonIan D McKeag, MD,MS,  PGY3 08/03/2016 6:12 PM

## 2016-08-03 NOTE — Assessment & Plan Note (Addendum)
Patient is here with worsening left-sided knee pain. He is a history of bilateral knee osteoarthritis. She states she has been trying to be more active recently and recently has been inhibited due to this significant pain. No warmth, erythema, or effusion suggesting gout or septic joint. - Intra-articular injection provided (although it was discussed with patient that I normally like to wait until at least 3 months after the last injection, patient stated her understanding), patient tolerated well. - Discontinued naproxen as patient states that this was not helping. - Initiate diclofenac

## 2016-08-31 ENCOUNTER — Ambulatory Visit: Payer: No Typology Code available for payment source | Admitting: Internal Medicine

## 2016-10-26 ENCOUNTER — Ambulatory Visit: Payer: No Typology Code available for payment source | Admitting: Family Medicine

## 2016-11-02 ENCOUNTER — Telehealth: Payer: Self-pay | Admitting: Family Medicine

## 2016-11-02 NOTE — Telephone Encounter (Signed)
Call complete. Confirmed scheduled appointment and advised patient to bring all current medications and to arrive early for check in.

## 2016-11-03 ENCOUNTER — Ambulatory Visit: Payer: Self-pay | Admitting: Family Medicine

## 2016-11-06 ENCOUNTER — Ambulatory Visit: Payer: No Typology Code available for payment source | Admitting: Family Medicine

## 2016-11-11 ENCOUNTER — Ambulatory Visit: Payer: Self-pay | Admitting: Family Medicine

## 2016-11-30 ENCOUNTER — Telehealth: Payer: Self-pay | Admitting: Family Medicine

## 2016-11-30 NOTE — Telephone Encounter (Signed)
Called to confirm appointment for 12/04/16. Patient was advised to arrive early for check-in and to bring any current medications. No further concerns at this time.

## 2016-12-04 ENCOUNTER — Encounter: Payer: Self-pay | Admitting: Family Medicine

## 2016-12-04 ENCOUNTER — Ambulatory Visit (INDEPENDENT_AMBULATORY_CARE_PROVIDER_SITE_OTHER): Payer: Self-pay | Admitting: Family Medicine

## 2016-12-04 VITALS — BP 110/80 | HR 76 | Temp 98.2°F | Wt 334.0 lb

## 2016-12-04 DIAGNOSIS — M25562 Pain in left knee: Secondary | ICD-10-CM

## 2016-12-04 DIAGNOSIS — G8929 Other chronic pain: Secondary | ICD-10-CM

## 2016-12-04 DIAGNOSIS — G5603 Carpal tunnel syndrome, bilateral upper limbs: Secondary | ICD-10-CM

## 2016-12-04 DIAGNOSIS — M25561 Pain in right knee: Secondary | ICD-10-CM

## 2016-12-04 MED ORDER — METHYLPREDNISOLONE ACETATE 40 MG/ML IJ SUSP
40.0000 mg | Freq: Once | INTRAMUSCULAR | Status: AC
  Administered 2016-12-04: 40 mg via INTRAMUSCULAR

## 2016-12-04 NOTE — Assessment & Plan Note (Signed)
Worsening: Patient is here with bilateral knee pain. No new trauma or injury. She would like another round of injections at this time. - Bilateral corticosteroid injections provided today. Patient tolerated the procedure well. - Normal preterm precautions discussed. - Follow-up when necessary

## 2016-12-04 NOTE — Progress Notes (Signed)
   HPI  CC: Bilateral knee pain and carpal tunnel Patient is here with bilateral knee pain. She states that she had previously had significant improvement with knee injections. Pain is same as last visit. Located on the inferior aspect of her patella. Pain feels deep. Pain does not radiate. Occasionally has popping/clicking. No recent injury or trauma. No redness or swelling. Denies weakness, numbness, paresthesias, fever, or chills.  Carpal tunnel syndrome: (L>R) Patient is also complaining of carpal tunnel syndrome. She states that she frequently wakes up having to shake her hands to make the numbness stop. She denies any issues with grip strength or dropping objects. Discomfort is worse with sleeping in certain positions of her arms.  Review of Systems See HPI for ROS.   CC, SH/smoking status, and VS noted  Objective: BP 110/80   Pulse 76   Temp 98.2 F (36.8 C) (Oral)   Wt (!) 334 lb (151.5 kg)   SpO2 96%   BMI 57.33 kg/m  Gen: NAD, alert, cooperative. CV: Well-perfused. Resp: Non-labored. Neuro: Sensation intact throughout. Hands/Wrists, Bilateral: No obvious ecchymoses, or effusions, erythema, or bony deformity. ROM intact throughout. Strength intact throughout. No area of point tenderness. Phalen test positive after ~15-20 seconds (L>R).  Knees, Bilateral: No evidence of effusion, ecchymoses, erythema, or bony abnormality. Bilateral patellar crepitus present. ROM intact throughout. TTP along the inferior and medial poles of the patella bilaterally. Slight medial joint line tenderness noted. Strength intact. No evidence of ligamental laxity. Negative McMurray's.  INJECTION: Bilateral knees Patient was given informed consent, signed copy in the chart. Appropriate time out was taken. Area prepped and draped in usual sterile fashion. 1 cc of methylprednisolone 40 mg/ml plus  4 cc of 1% lidocaine without epinephrine was injected into the left and right knee using a(n) inferiomedial  approach. The patient tolerated the procedure well. There were no complications. Post procedure instructions were given.   Assessment and plan:  Bilateral knee pain Worsening: Patient is here with bilateral knee pain. No new trauma or injury. She would like another round of injections at this time. - Bilateral corticosteroid injections provided today. Patient tolerated the procedure well. - Normal preterm precautions discussed. - Follow-up when necessary  Carpal tunnel syndrome Patient having worsening symptoms of carpal tunnel. No evidence of peripheral weakness. Phalen's test positive. Patient has tried a long trial of wrist splinting without symptom improvement. I've asked patient to follow-up to have a left-sided carpal tunnel injection in 1-2 weeks, assuming symptoms persist. - Follow-up 1-2 weeks when necessary.   Meds ordered this encounter  Medications  . methylPREDNISolone acetate (DEPO-MEDROL) injection 40 mg  . methylPREDNISolone acetate (DEPO-MEDROL) injection 40 mg     Kathee Delton, MD,MS,  PGY3 12/04/2016 1:52 PM

## 2016-12-04 NOTE — Assessment & Plan Note (Signed)
Patient having worsening symptoms of carpal tunnel. No evidence of peripheral weakness. Phalen's test positive. Patient has tried a long trial of wrist splinting without symptom improvement. I've asked patient to follow-up to have a left-sided carpal tunnel injection in 1-2 weeks, assuming symptoms persist. - Follow-up 1-2 weeks when necessary.

## 2016-12-25 ENCOUNTER — Ambulatory Visit: Payer: No Typology Code available for payment source | Admitting: Family Medicine

## 2017-01-25 ENCOUNTER — Ambulatory Visit: Payer: Self-pay | Admitting: Family Medicine

## 2017-02-01 ENCOUNTER — Ambulatory Visit: Payer: Self-pay | Admitting: Family Medicine

## 2017-02-15 ENCOUNTER — Ambulatory Visit (INDEPENDENT_AMBULATORY_CARE_PROVIDER_SITE_OTHER): Payer: Self-pay | Admitting: Student

## 2017-02-15 ENCOUNTER — Encounter: Payer: Self-pay | Admitting: Student

## 2017-02-15 VITALS — BP 109/75 | HR 70 | Temp 98.0°F | Ht 66.0 in | Wt 335.4 lb

## 2017-02-15 DIAGNOSIS — M25562 Pain in left knee: Secondary | ICD-10-CM

## 2017-02-15 DIAGNOSIS — M25561 Pain in right knee: Secondary | ICD-10-CM

## 2017-02-15 DIAGNOSIS — G8929 Other chronic pain: Secondary | ICD-10-CM

## 2017-02-15 DIAGNOSIS — G5602 Carpal tunnel syndrome, left upper limb: Secondary | ICD-10-CM

## 2017-02-15 MED ORDER — METHYLPREDNISOLONE ACETATE 40 MG/ML IJ SUSP
40.0000 mg | Freq: Once | INTRAMUSCULAR | Status: AC
  Administered 2017-02-15: 40 mg via INTRAMUSCULAR

## 2017-02-15 NOTE — Patient Instructions (Signed)
It was great seeing you today! We have addressed the following issues today 1. Knee pain: we gave you a steroid injection today. Please let us know if you have fever, swelling or other symptoms concerning to you.   If we did any lab work today, and the results require attention, either me or my nurse will get in touch with you. If everything is normal, you will get a letter in mail and a message via . If you don't hear from [place] in two weeks, please give [place] a call. Otherwise, we look forward to seeing you again at your next visit. If you have any questions or concerns before then, please call the clinic at [phone].  Please bring all your medications to every doctors visit  Sign up for My Chart to have easy access to your labs results, and communication with your Primary care physician.    Please check-out at the front desk before leaving the clinic.    Take Care,   Dr. Alanda Slim  Knee Injection, Care After Refer to this sheet in the next few weeks. These instructions provide you with information about caring for yourself after your procedure. Your health care provider may also give you more specific instructions. Your treatment has been planned according to current medical practices, but problems sometimes occur. Call your health care provider if you have any problems or questions after your procedure. What can I expect after the procedure? After the procedure, it is common to have:  Soreness.  Warmth.  Swelling.  You may have more pain, swelling, and warmth than you did before the injection. This reaction may last for about one day. Follow these instructions at home: Bathing  If you were given a bandage (dressing), keep it dry until your health care provider says it can be removed. Ask your health care provider when you can start showering or taking a bath. Managing pain, stiffness, and swelling  If directed, apply ice to the injection area: ? Put ice in a plastic bag. ? Place  a towel between your skin and the bag. ? Leave the ice on for 20 minutes, 2-3 times per day.  Do not apply heat to your knee.  Raise the injection area above the level of your heart while you are sitting or lying down. Activity  Avoid strenuous activities for as long as directed by your health care provider. Ask your health care provider when you can return to your normal activities. General instructions  Take medicines only as directed by your health care provider.  Do not take aspirin or other over-the-counter medicines unless your health care provider says you can.  Check your injection site every day for signs of infection. Watch for: ? Redness, swelling, or pain. ? Fluid, blood, or pus.  Follow your health care provider's instructions about dressing changes and removal. Contact a health care provider if:  You have symptoms at your injection site that last longer than two days after your procedure.  You have redness, swelling, or pain in your injection area.  You have fluid, blood, or pus coming from your injection site.  You have warmth in your injection area.  You have a fever.  Your pain is not controlled with medicine. Get help right away if:  Your knee turns very red.  Your knee becomes very swollen.  Your knee pain is severe. This information is not intended to replace advice given to you by your health care provider. Make sure you discuss any  questions you have with your health care provider. Document Released: 08/24/2014 Document Revised: 04/08/2016 Document Reviewed: 06/13/2014 Elsevier Interactive Patient Education  [employer]2018 [employer]

## 2017-02-15 NOTE — Assessment & Plan Note (Signed)
At the end of the encounter, patient complained about her carpel tunnel syndrome in her left arm that has not responded nightly wrist brace and NSAIDs for the last two months. She reports nocturnal pain. Tinel and Phalen's signs positive. Will order ambulatory referral to neurology for neurodiagnostic study. She may need surgical release of her CTS.

## 2017-02-15 NOTE — Progress Notes (Signed)
Subjective:    Sandra Orr is a 46 y.o. old female here for knee pain  HPI Knee pain: this is chronic issue for the last 3-4 years. She gets steroid injection every 3 months. Last injection was about 3 months ago. No changes in the nature of her pain but worse. Reports some swelling as well. Denies skin erythema or fever. Denies numbness or tingling in the legs. No history of trauma.   PMH/Problem List: has Morbid obesity (HCC); Depression; Chronic pain; Tobacco abuse; DM type 2 (diabetes mellitus, type 2) (HCC); Homelessness; Essential hypertension, benign; Bilateral knee pain; Carpal tunnel syndrome; Abdominal pain; Constipation; Rash and nonspecific skin eruption; Excessive somnolence disorder; and Chronic pain of left knee on her problem list.   has a past medical history of Arthritis; Back pain; Depression; and Obesity.  FH:  Family History  Problem Relation Age of Onset  . Depression Mother        bipolar/schizo  . Heart disease Mother   . Diabetes Mother   . Asthma Father   . Hyperlipidemia Father   . Hypertension Father   . Depression Maternal Grandmother   . Heart disease Maternal Grandmother   . Diabetes Maternal Grandmother   . Hypertension Maternal Grandfather   . Breast cancer Paternal Aunt     SH Social History  Substance Use Topics  . Smoking status: Current Every Day Smoker    Packs/day: 0.50    Years: 20.00    Types: Cigarettes  . Smokeless tobacco: Never Used  . Alcohol use No    Review of Systems Review of systems negative except for pertinent positives and negatives in history of present illness above.     Objective:     Vitals:   02/15/17 1446  BP: 109/75  Pulse: 70  Temp: 98 F (36.7 C)  TempSrc: Oral  SpO2: 99%  Weight: (!) 335 lb 6.4 oz (152.1 kg)  Height: 5\' 6"  (1.676 m)    Physical Exam GEN: appears well, no apparent distress. CVS: DP pulses 2+ bilaterally RESP: speaks in full sentence, no IWOB MSK:   LEs and Knees Mild effusion  noted bilaterally. Otherwise, normal to inspection with no erythema or obvious bony abnormalities. No warmth to touch. Positive for joint line tenderness bilaterally.  ROM full in flexion and extension and lower leg rotation. Ligaments with solid consistent endpoints including ACL, PCL, LCL, MCL. Patellar glide positive for crepitus. Neurovascular intact  Left hand: symmetric compared to right. Tinel and Phalen signs positive. Slightly diminished light sensation in median nerve distribution on the left.   SKIN: no apparent skin lesion NEURO: alert and oiented appropriately, no gross defecits  PSYCH: euthymic mood with congruent affect  Procedure-1:  Injection of right knee Consent obtained and verified. Time-out conducted. Noted no overlying erythema, induration, or other signs of local infection. Patient not on anticoagulants  Approach: Anterolateral Skin prepped in a sterile fashion. Topical analgesic spray: Ethyl chloride. Injection completed without difficulty with Solumedrol 40 mg (1cc) and Lidocaine 1% (3cc).  Pain immediately improved suggesting accurate placement of the medication. Patient was able to ambulate without difficulty after the injections.  Advised to call if fevers/chills, erythema, induration, drainage, or persistent bleeding.  Procedure-2:  Injection of left knee Consent obtained and verified. Time-out conducted. Noted no overlying erythema, induration, or other signs of local infection. Patient not on anticoagulants  Approach: Anterolateral Skin prepped in a sterile fashion. Topical analgesic spray: Ethyl chloride. Injection completed without difficulty with Solumedrol 40 mg (1cc) and  Lidocaine 1% (3cc).  Pain immediately improved suggesting accurate placement of the medication. Patient was able to ambulate without difficulty after the injection.  Advised to call if fevers/chills, erythema, induration, drainage, or persistent bleeding.    Assessment and  Plan:  Bilateral knee pain History and exam suggestive for osteoarthritis. This is a chronic issue. She has been getting steroid injection almost every 3 months. She reports improvement with this. Steroid injection in both knees today. Discussed return precautions including fever, swelling or other symptoms concerning to her. Recommended using ice for mild swelling.  Carpal tunnel syndrome At the end of the encounter, patient complained about her carpel tunnel syndrome in her left arm that has not responded nightly wrist brace and NSAIDs for the last two months. She reports nocturnal pain. Tinel and Phalen's signs positive. Will order ambulatory referral to neurology for neurodiagnostic study. She may need surgical release of her CTS.   Orders Placed This Encounter  Procedures  . Ambulatory referral to Neurology   Meds ordered this encounter  Medications  . methylPREDNISolone acetate (DEPO-MEDROL) injection 40 mg  . methylPREDNISolone acetate (DEPO-MEDROL) injection 40 mg   Return if symptoms worsen or fail to improve.  Almon Herculesaye T Alexzia Kasler, MD 02/15/17 Pager: [phone]

## 2017-02-15 NOTE — Assessment & Plan Note (Signed)
History and exam suggestive for osteoarthritis. This is a chronic issue. She has been getting steroid injection almost every 3 months. She reports improvement with this. Steroid injection in both knees today. Discussed return precautions including fever, swelling or other symptoms concerning to her. Recommended using ice for mild swelling.

## 2017-04-13 ENCOUNTER — Ambulatory Visit: Payer: No Typology Code available for payment source | Admitting: Diagnostic Neuroimaging

## 2017-04-14 ENCOUNTER — Encounter: Payer: Self-pay | Admitting: Diagnostic Neuroimaging

## 2017-05-21 ENCOUNTER — Other Ambulatory Visit: Payer: Self-pay | Admitting: Student

## 2017-05-21 DIAGNOSIS — Z1231 Encounter for screening mammogram for malignant neoplasm of breast: Secondary | ICD-10-CM

## 2017-06-01 ENCOUNTER — Ambulatory Visit: Payer: No Typology Code available for payment source | Admitting: Family Medicine

## 2017-06-07 ENCOUNTER — Ambulatory Visit (INDEPENDENT_AMBULATORY_CARE_PROVIDER_SITE_OTHER): Payer: Self-pay | Admitting: Family Medicine

## 2017-06-07 ENCOUNTER — Encounter: Payer: Self-pay | Admitting: Family Medicine

## 2017-06-07 VITALS — BP 118/72 | HR 75 | Temp 98.3°F | Ht 66.0 in | Wt 352.6 lb

## 2017-06-07 DIAGNOSIS — M25561 Pain in right knee: Secondary | ICD-10-CM

## 2017-06-07 DIAGNOSIS — M25562 Pain in left knee: Secondary | ICD-10-CM

## 2017-06-07 MED ORDER — METHYLPREDNISOLONE ACETATE 40 MG/ML IJ SUSP
40.0000 mg | Freq: Once | INTRAMUSCULAR | Status: AC
  Administered 2017-06-07: 40 mg via INTRAMUSCULAR

## 2017-06-07 NOTE — Patient Instructions (Signed)
Sandra Orr, today you had knee injections in both of your knees.  You mentioned that you usually are more active for the first two months after injections. I am recommending that you work hard to try to exercise for the next couple months and lose some of the weight you have gained since July.   Remember for every pound you lose, that is equal to 4 pounds off of your knees.   Please come back in two months and we can see how you are progressing.   Very nice to meet you today, Sandra BoomDaniel L. Myrtie SomanWarden, MD [facility] Resident PGY-2 06/07/2017 4:26 PM

## 2017-06-07 NOTE — Progress Notes (Signed)
_ Subjective:  Sandra Orr is a 46 y.o. female who [facility] today with a chief complaint of bilateral knee pain   HPI:  Bilateral knee osteoarthritis with morbid obesity: Usually has improved activity for 2 months or so after bilateral injections. Has gained about 20 pounds over the last few months. Usually has injections about every 4 months. Denies any fevers, chills, nausea, vomiting, SOB, chest pain.    PMH: see above Tobacco use: current smoker Medication: reviewed and updated ROS: see HPI   Objective:  Physical Exam: BP 118/72   Pulse 75   Temp 98.3 F (36.8 C) (Oral)   Ht 5\' 6"  (1.676 m)   Wt (!) 352 lb 9.6 oz (159.9 kg)   SpO2 96%   BMI 56.91 kg/m   Gen: 46yo F in NAD, resting comfortably MSK: no edema, cyanosis, or clubbing noted, significant medial and lateral joint line tenderness bilaterally   No results found for this or any previous visit (from the past 72 hour(s)).  INJECTION: Patient was given informed consent, signed copy in the chart. Appropriate time out was taken. Area prepped and draped in usual sterile fashion. 1 cc of methylprednisolone 40 mg/ml plus  3 cc of 1% lidocaine without epinephrine was injected into the bilateral knees using a(n) medial approach. The patient tolerated the procedure well. There were no complications. Post procedure instructions were given. Assessment/Plan:  Bilateral knee pain Patient presented today for knee injections and tolerated the procedure well without complications. Discussed importance of exercise, especially during the first couple of months when she experiences improvement in symptoms. She has gained about 20 pounds over the last few months. I asked her to come back in 2 months and we can reassess how she is doing with exercise. Weight loss for this patient is very important for symptom relief.   Healthcare maintenance Overdue for many items. Asked her to come back in 2 months for a check up.   Damonica Chopra  L. Myrtie SomanWarden, MD [facility] Resident PGY-2 06/08/2017 5:16 PM

## 2017-06-08 NOTE — Assessment & Plan Note (Addendum)
Patient presented today for knee injections and tolerated the procedure well without complications. Discussed importance of exercise, especially during the first couple of months when she experiences improvement in symptoms. She has gained about 20 pounds over the last few months. I asked her to come back in 2 months and we can reassess how she is doing with exercise. Weight loss for this patient is very important for symptom relief.

## 2017-06-09 ENCOUNTER — Ambulatory Visit
Admission: RE | Admit: 2017-06-09 | Discharge: 2017-06-09 | Disposition: A | Discharge status: Home / Self Care | Payer: No Typology Code available for payment source | Source: Ambulatory Visit | Attending: Family Medicine | Admitting: Family Medicine

## 2017-06-09 DIAGNOSIS — Z1231 Encounter for screening mammogram for malignant neoplasm of breast: Secondary | ICD-10-CM

## 2017-06-17 ENCOUNTER — Telehealth: Payer: Self-pay | Admitting: Family Medicine

## 2017-06-17 ENCOUNTER — Other Ambulatory Visit: Payer: Self-pay | Admitting: Family Medicine

## 2017-06-17 MED ORDER — ALBUTEROL SULFATE HFA 108 (90 BASE) MCG/ACT IN AERS: 1.0000 | INHALATION_SPRAY | Freq: Four times a day (QID) | RESPIRATORY_TRACT | 0 refills | Status: DC | PRN

## 2017-06-17 NOTE — Telephone Encounter (Signed)
Pt needs refill on her inhaler. Was told it was going to be sent to pharmacy. Rx- albuterol

## 2017-06-23 ENCOUNTER — Ambulatory Visit (INDEPENDENT_AMBULATORY_CARE_PROVIDER_SITE_OTHER): Payer: Self-pay | Admitting: Family Medicine

## 2017-06-23 ENCOUNTER — Encounter: Payer: Self-pay | Admitting: Family Medicine

## 2017-06-23 DIAGNOSIS — M25562 Pain in left knee: Secondary | ICD-10-CM

## 2017-06-23 DIAGNOSIS — M25561 Pain in right knee: Secondary | ICD-10-CM

## 2017-06-23 DIAGNOSIS — G8929 Other chronic pain: Secondary | ICD-10-CM

## 2017-06-23 MED ORDER — METHYLPREDNISOLONE ACETATE 80 MG/ML IJ SUSP
80.0000 mg | Freq: Once | INTRAMUSCULAR | Status: AC
  Administered 2017-06-23: 80 mg via INTRA_ARTICULAR

## 2017-06-23 NOTE — Progress Notes (Signed)
Subjective:    Patient ID: Janann AugustVeronica A Dohner, female    DOB: Jan 24, 1971, 46 y.o.   MRN: [id number]  HPI  CC: left knee pain Ms. Jennette Kettleeal is a 46yo female with bilateral knee osteoarthritis who presents with persistent left knee pain after a steroid injection approximately two weeks ago. Patient requests another injection. She was seen by her family physician on 10/22, where she had 1cc methylprednisolone 40mg /ml injections done in bilateral knees. Since that time, she reports persistent pain in her left knee that limits her daily activity. She did have approximately 1 day of relief in her left knee but after that, the pain returned. The pain in her left knee is the same as her typical knee pains. She denies any pain or discomfort in her right knee. Denies new trauma to her knees, numbness or tingling in her lower extremities, weakness, fever, or chills. She typically has steroid injections ~every 4 months and reports significant improvement with previous knee injections.  Review of Systems Negative except as stated above in HPI.    Objective:   Physical Exam Blood pressure (!) 141/63, height 5' 4.5" (1.638 m), weight (!) 380 lb (172.4 kg).  Well-developed, well-nourished, obese. In no acute distress LLE: TTP on inferior patella and medial and lateral joint line. No edema or erythema noted. Normal sensation. No ligamental laxity noted. Normal ROM. 5/5 strength in knee flexion and extension.  INJECTION: Left knee Patient was given informed consent, signed copy in the chart. Appropriate time out was taken. Area prepped and draped in usual sterile fashion. 1 cc of methylprednisolone 80 mg/ml plus 3 cc of 1% lidocaine without epinephrine was injected into the left knee using a(n) inferolateral approach. The patient tolerated the procedure well. There were no complications. Post procedure instructions were given. Supervised by Dr. Mickie HillierIan Jonah Nestle.    Assessment & Plan:   Bilateral knee  pain Assessment Patient presents with persistent left knee pain after bilateral knee steroid injections on 10/22. Reports no complaints with right knee. Typically gets injections ~every 4 months. We did discuss that we typically do not give repeat steroid injections in such short intervals, however because she did experience relief in her right knee and because the lower 40mg  dose was used at her last injection, it was felt that another injection today would be appropriate. Steroid injection in left knee with 80mg  methylprednisolone was performed today, which the patient tolerated well. She was reminded that the typical time frame for steroid injections is >353months and that the injections only provide temporary symptomatic relief, with total knee arthroplasty being the definitive treatment.  We also discussed the importance of weight loss, not only in helping to provide symptomatic relief, but also for the possibility of surgical intervention in the future if that is something she would like to consider. She endorsed understanding but stated that she was not yet at a point in her personal life where she felt she was prepared to undergo surgery. She also reports frustration that she only eats one meal a day and she has not been able to lose weight. We discussed splitting the one meal into 3-4 small meals throughout the day to assist with her metabolism. Patient agreed to continue to work on her diet and weight loss.  Plan - Left knee corticosteroid injection performed today in clinic. Patient tolerated procedure well. (We did discuss that the typical interval for steroid injections is >3 months. Patient endorsed understanding). - Encouraged her to continue working on weight loss -  Advised patient to follow up as needed in the Sports Medicine Clinic should she have any MSK complaints or for future steroid injections, but otherwise she should continue following with her PCP for her other chronic health  conditions   Meds ordered this encounter  Medications  . methylPREDNISolone acetate (DEPO-MEDROL) injection 80 mg    Attending/Fellow Addendum:  I have seen, evaluated, and discussed this patient's visit at length and in detail with Dr. Renaldo ReelHuang and reviewed the following note. I agree with the residents assessment and resulting plan. I have also listed my own PE/assessment/plan below:  Physical exam: Gen:NAD, alert, cooperative. AV:[id number]:Well-perfused. Resp:Non-labored. Knees, Left:No evidence of effusion, ecchymoses, erythema, or bony abnormality. Bilateral +1 patellar crepitus present. ROM intact throughout. TTP along the inferior and medial poles of the patella.Medial joint line tenderness also present.Strength intact. No evidence of ligamental laxity.  A/P: Bilateral primary atraumatic knee OA: -Left-sided injection provided today. - Discussed proper timing and expectations for future injections -Encouraged weight loss -Encouraged regular activity -Follow-up as needed   Kathee DeltonIan D Kerrie Timm, MD,MS [facility] Fellow 06/23/2017 4:59 PM

## 2017-06-23 NOTE — Assessment & Plan Note (Addendum)
Assessment Patient presents with persistent left knee pain after bilateral knee steroid injections on 10/22. Reports no complaints with right knee. Typically gets injections ~every 4 months. We did discuss that we typically do not give repeat steroid injections in such short intervals, however because she did experience relief in her right knee and because the lower 40mg  dose was used at her last injection, it was felt that another injection today would be appropriate. Steroid injection in left knee with 80mg  methylprednisolone was performed today, which the patient tolerated well. She was reminded that the typical time frame for steroid injections is >553months and that the injections only provide temporary symptomatic relief, with total knee arthroplasty being the definitive treatment.  We also discussed the importance of weight loss, not only in helping to provide symptomatic relief, but also for the possibility of surgical intervention in the future if that is something she would like to consider. She endorsed understanding but stated that she was not yet at a point in her personal life where she felt she was prepared to undergo surgery. She also reports frustration that she only eats one meal a day and she has not been able to lose weight. We discussed splitting the one meal into 3-4 small meals throughout the day to assist with her metabolism. Patient agreed to continue to work on her diet and weight loss.  Plan - Left knee corticosteroid injection performed today in clinic. Patient tolerated procedure well. (We did discuss that the typical interval for steroid injections is >3 months. Patient endorsed understanding). - Encouraged her to continue working on weight loss - Advised patient to follow up as needed in the Sports Medicine Clinic should she have any MSK complaints or for future steroid injections, but otherwise she should continue following with her PCP for her other chronic health conditions

## 2017-07-30 ENCOUNTER — Ambulatory Visit: Payer: Self-pay | Admitting: Family Medicine

## 2017-08-06 ENCOUNTER — Other Ambulatory Visit: Payer: Self-pay

## 2017-08-06 ENCOUNTER — Encounter: Payer: Self-pay | Admitting: Family Medicine

## 2017-08-06 ENCOUNTER — Ambulatory Visit (INDEPENDENT_AMBULATORY_CARE_PROVIDER_SITE_OTHER): Payer: Self-pay | Admitting: Family Medicine

## 2017-08-06 VITALS — BP 126/80 | HR 73 | Temp 98.4°F | Ht 65.0 in | Wt 349.4 lb

## 2017-08-06 DIAGNOSIS — G5603 Carpal tunnel syndrome, bilateral upper limbs: Secondary | ICD-10-CM

## 2017-08-06 MED ORDER — ALBUTEROL SULFATE HFA 108 (90 BASE) MCG/ACT IN AERS: 1.0000 | INHALATION_SPRAY | Freq: Four times a day (QID) | RESPIRATORY_TRACT | 0 refills | Status: DC | PRN

## 2017-08-06 MED ORDER — GABAPENTIN 100 MG PO CAPS: ORAL_CAPSULE | ORAL | 0 refills | Status: DC

## 2017-08-06 NOTE — Progress Notes (Signed)
    Subjective:  Sandra Orr is a 46 y.o. female who [facility] today with a chief complaint of bilateral hand pain  HPI:  Bilateral carpal tunnel Patient has a history of suspected bilateral carpal tunnel syndrome based on clinical findings.  She recently saw Dr. Alanda SlimGonfa who recommended that she follow-up with neurology to have a neurodiagnostic study to confirm the diagnosis.  Went to see neurologist and was told that she would have to pay out of pocket for neurodiagnostic study.  Continues to have aching, numbness and tingling in the first three digits.  She denies any trauma or weakness.   PMH: Bilateral knee osteoarthritis, numbness and tingling in the bilateral hands Tobacco use: Current smoker Medication: reviewed and updated ROS: see HPI   Objective:  Physical Exam: BP 126/80   Pulse 73   Temp 98.4 F (36.9 C) (Oral)   Ht 5\' 5"  (1.651 m)   Wt (!) 349 lb 6.4 oz (158.5 kg)   SpO2 97%   BMI 58.14 kg/m   Gen: 46 year old female in NAD, resting comfortably CV: RRR with no murmurs appreciated Pulm: NWOB, CTAB with no crackles, wheezes, or rhonchi GI: Normal bowel sounds present. Soft, Nontender, Nondistended. MSK: Positive Phalen's sign bilaterally with numbness in the first 3 digits.  Positive Finkelstein's test and tenderness to palpation proximal to the radial styloid bilaterally.  Grip strength 5 out of 5.  Wrists with 5 out of 5 strength and full range of motion. Skin: warm, dry Neuro: grossly normal, moves all extremities Psych: Normal affect and thought content  No results found for this or any previous visit (from the past 72 hour(s)).   Assessment/Plan:  Carpal tunnel syndrome Patient has positive Phalen sign bilaterally with numbness and tingling elicited in the first second and third digits.  She has no apparent thenar atrophy.  Grip strength in upper extremity strength 5 out of 5 bilaterally.  Additionally has some tenderness to palpation just  proximal to the radial styloids bilaterally and positive Finkelstein's test which may be consistent with de Quervain's tenosynovitis.  Patient has follow-up appointment scheduled for August 19, 2016 for carpal tunnel injections.  Started her on gabapentin 100 mg 3 times daily with instructions to titrate up to 300 3 times daily as tolerated.   Health maintenance Patient is currently self-pay and is due for several health maintenance items.  She is currently in the process of applying for disability.  I recommended that if she does not qualify for disability that she needs to apply for Medicaid as soon as possible so that we can check off some of these items.   Rianne Degraaf L. Myrtie SomanWarden, MD [facility] Resident PGY-2 08/06/2017 2:05 PM

## 2017-08-06 NOTE — Assessment & Plan Note (Signed)
Patient has positive Phalen sign bilaterally with numbness and tingling elicited in the first second and third digits.  She has no apparent thenar atrophy.  Grip strength in upper extremity strength 5 out of 5 bilaterally.  Additionally has some tenderness to palpation just proximal to the radial styloids bilaterally and positive Finkelstein's test which may be consistent with de Quervain's tenosynovitis.  Patient has follow-up appointment scheduled for August 19, 2016 for carpal tunnel injections.  Started her on gabapentin 100 mg 3 times daily with instructions to titrate up to 300 3 times daily as tolerated.

## 2017-08-06 NOTE — Patient Instructions (Signed)
Sandra Orr, your seen today for pain in your hands which is most likely carpal tunnel syndrome.  Since you have not had any improvement in your symptoms after trying the splints on her hands or with ibuprofen, I am prescribing you a medication that can help with nerve pain called gabapentin.  You can start with 100 mg 3 times a day and if you have no side effects you can increase this to 300 mg 3 times a day.  Please follow-up for your next scheduled appointment and we can do carpal tunnel injections.  Please work on getting health insurance.  Please call the office with any questions.Nature conservation officer  Enjoy the holidays! Kiamesha Samet L. Myrtie SomanWarden, MD [facility] Resident PGY-2 08/06/2017 10:39 AM

## 2017-08-19 ENCOUNTER — Ambulatory Visit: Payer: Self-pay | Admitting: Family Medicine

## 2017-09-22 ENCOUNTER — Ambulatory Visit: Payer: Self-pay | Admitting: Family Medicine

## 2017-10-08 ENCOUNTER — Ambulatory Visit: Payer: Self-pay | Admitting: Family Medicine

## 2017-10-27 ENCOUNTER — Encounter: Payer: Self-pay | Admitting: Family Medicine

## 2017-10-27 ENCOUNTER — Ambulatory Visit (INDEPENDENT_AMBULATORY_CARE_PROVIDER_SITE_OTHER): Payer: Self-pay | Admitting: Family Medicine

## 2017-10-27 DIAGNOSIS — G8929 Other chronic pain: Secondary | ICD-10-CM

## 2017-10-27 DIAGNOSIS — M25561 Pain in right knee: Secondary | ICD-10-CM

## 2017-10-27 DIAGNOSIS — M25562 Pain in left knee: Secondary | ICD-10-CM

## 2017-10-27 MED ORDER — MELOXICAM 15 MG PO TABS: 15.0000 mg | ORAL_TABLET | Freq: Every day | ORAL | 2 refills | Status: DC

## 2017-10-27 NOTE — Assessment & Plan Note (Signed)
Patient is here with her chronic bilateral knee pain.  She does not desire any injections today as she states that her pain is relatively well managed but would like some recommendations on day-to-day ways to manage her pain.  No new injuries.  She endorses some swelling. -Discussed weight management. -Compression sleeves encouraged, recommended OTC sleeves at this time and had staff demonstrate to her the feeling of well fitting compression sleeves in the office today. -Mobic once daily as needed -Follow-up as needed

## 2017-10-27 NOTE — Progress Notes (Signed)
   HPI  CC: Bilateral knee pain She is here with complaints of bilateral knee pain.  She has been dealing with this chronic issue for many years.  I have followed her during much of this time.  She continues to have issues with prolonged standing and ambulation.  Patient is morbidly obese and she states that she is unable to be as active as she desires due to her pain.  In the past she has received intra-articular steroid injections into both knees which has provided some pain relief.  She states that her pain today is manageable and she does not desire any injections today.  She denies any new injury, trauma, or events since our last visit.  She states that she is seeking some day-to-day ways to manage some of the swelling and pain.  She denies any knee instability or mechanical symptoms.  Medications/Interventions Tried: Injections. HEP  See HPI and/or previous note for associated ROS.  Objective: BP 139/74   Ht 5\' 6"  (1.676 m)   Wt (!) 356 lb (161.5 kg)   BMI 57.46 kg/m  Gen: NAD, well groomed, a/o x3, normal affect.  CV: Well-perfused. Warm.  Resp: Non-labored.  Neuro: Sensation intact throughout. No gross coordination deficits.  Gait: Nonpathologic posture, unremarkable stride without signs of limp or balance issues. Knees, Bilateral: No evidence of effusion, ecchymoses, erythema, or bony abnormality. Bilateral patellar crepitus present. ROM intact throughout. Mild TTP along the inferior and medial poles of the patella bilaterally. Slight medial joint line tenderness noted. Strength intact. No evidence of ligamental laxity. Negative McMurray's.  Assessment and plan:  Bilateral knee pain Patient is here with her chronic bilateral knee pain.  She does not desire any injections today as she states that her pain is relatively well managed but would like some recommendations on day-to-day ways to manage her pain.  No new injuries.  She endorses some swelling. -Discussed weight  management. -Compression sleeves encouraged, recommended OTC sleeves at this time and had staff demonstrate to her the feeling of well fitting compression sleeves in the office today. -Mobic once daily as needed -Follow-up as needed   Meds ordered this encounter  Medications  . meloxicam (MOBIC) 15 MG tablet    Sig: Take 1 tablet (15 mg total) by mouth daily.    Dispense:  30 tablet    Refill:  2     Kathee DeltonIan D McKeag, MD,MS [facility] Fellow 10/27/2017 5:49 PM

## 2017-12-22 ENCOUNTER — Encounter: Payer: Self-pay | Admitting: Family Medicine

## 2017-12-22 ENCOUNTER — Ambulatory Visit (INDEPENDENT_AMBULATORY_CARE_PROVIDER_SITE_OTHER): Payer: Self-pay | Admitting: Family Medicine

## 2017-12-22 DIAGNOSIS — M170 Bilateral primary osteoarthritis of knee: Secondary | ICD-10-CM

## 2017-12-22 MED ORDER — METHYLPREDNISOLONE ACETATE 80 MG/ML IJ SUSP
80.0000 mg | Freq: Once | INTRAMUSCULAR | Status: AC
  Administered 2017-12-22: 80 mg via INTRAMUSCULAR

## 2017-12-22 NOTE — Progress Notes (Signed)
   HPI  CC: Bilateral knee pain Patient is here to follow-up regarding her bilateral knee pain.  She states that she has had some worsening in this discomfort and desires another round of steroid injections.  She denies any new injuries or trauma.  No new swelling.  No weakness, numbness, or paresthesias.  She is aware that the steroid injections are only controlling her symptoms and have no effect on the underlying problem which is her morbid obesity and degenerative arthritis.  Patient states that the discomfort from her knees have begun to keep her from being as active as she would like to be and this is what prompted her to come back in for repeat injections.  Medications/Interventions Tried: Intra-articular steroid injections, NSAIDs  See HPI and/or previous note for associated ROS.  Objective: BP 111/83   Ht 5' 5.5" (1.664 m)   Wt (!) 354 lb (160.6 kg)   BMI 58.01 kg/m  Gen: NAD, well groomed, a/o x3, normal affect.  CV: Well-perfused. Warm.  Resp: Non-labored.  Neuro: Sensation intact throughout. No gross coordination deficits.  Gait: Nonpathologic posture, unremarkable stride without signs of limp or balance issues. Knees, Bilateral:No evidence of effusion, ecchymoses, erythema, or bony abnormality. Bilateral patellar crepitus present. ROM intact throughout. Mild TTP along the inferior and medial poles of the patella bilaterally.Moderate joint line tenderness noted (Med > Lat, and R > L).Strength intact.  All 4 ligaments with solid endpoints. Negative McMurray's.  INJECTION: Bilateral knees Patient was given informed consent, signed copy in the chart. Appropriate time out was taken. Area prepped and draped in usual sterile fashion. 1 cc of methylprednisolone 80 mg/ml plus  3 cc of 1% lidocaine without epinephrine was injected first into the right knee, then the left knee using a(n) anterior-inferior-medial approach. The patient tolerated the procedure well. There were no  complications. Post procedure instructions were given.   Assessment and plan:  Primary localized osteoarthritis of knees, bilateral Patient is here to follow-up regarding her bilateral osteoarthritis of her knees.  Recently acutely worse.  She desires bilateral injections today.  We spent some time discussing the long-term benefits of weight loss regarding her knees.  Patient states she will continue her attempts to stay active and hopefully lose weight. -Bilateral injections provided today.  Patient tolerated procedures well. -Patient to follow-up as needed, she is aware she must wait 3 months before injections can be performed again. -Encouraged patient to discuss weight loss resources with PCP.  Morbid obesity See plan above. -Will need to have a BMI at or below 40 for most surgeons to consider total joint replacements.   Meds ordered this encounter  Medications  . methylPREDNISolone acetate (DEPO-MEDROL) injection 80 mg  . methylPREDNISolone acetate (DEPO-MEDROL) injection 80 mg     Kathee Delton, MD,MS [facility] Sports Medicine Fellow 12/22/2017 5:30 PM

## 2017-12-22 NOTE — Assessment & Plan Note (Addendum)
See plan above. -Will need to have a BMI at or below 40 for most surgeons to consider total joint replacements.

## 2017-12-22 NOTE — Assessment & Plan Note (Signed)
Patient is here to follow-up regarding her bilateral osteoarthritis of her knees.  Recently acutely worse.  She desires bilateral injections today.  We spent some time discussing the long-term benefits of weight loss regarding her knees.  Patient states she will continue her attempts to stay active and hopefully lose weight. -Bilateral injections provided today.  Patient tolerated procedures well. -Patient to follow-up as needed, she is aware she must wait 3 months before injections can be performed again. -Encouraged patient to discuss weight loss resources with PCP.

## 2017-12-23 ENCOUNTER — Encounter (HOSPITAL_COMMUNITY): Payer: Self-pay | Admitting: Emergency Medicine

## 2017-12-23 ENCOUNTER — Emergency Department (HOSPITAL_COMMUNITY)
Admission: EM | Admit: 2017-12-23 | Discharge: 2017-12-23 | Disposition: A | Discharge status: Home / Self Care | Payer: Self-pay | Attending: Emergency Medicine | Admitting: Emergency Medicine

## 2017-12-23 ENCOUNTER — Emergency Department (HOSPITAL_COMMUNITY): Payer: Self-pay

## 2017-12-23 ENCOUNTER — Other Ambulatory Visit: Payer: Self-pay

## 2017-12-23 DIAGNOSIS — E119 Type 2 diabetes mellitus without complications: Secondary | ICD-10-CM | POA: Insufficient documentation

## 2017-12-23 DIAGNOSIS — I10 Essential (primary) hypertension: Secondary | ICD-10-CM | POA: Insufficient documentation

## 2017-12-23 DIAGNOSIS — R1084 Generalized abdominal pain: Secondary | ICD-10-CM | POA: Insufficient documentation

## 2017-12-23 DIAGNOSIS — R197 Diarrhea, unspecified: Secondary | ICD-10-CM | POA: Insufficient documentation

## 2017-12-23 DIAGNOSIS — F17210 Nicotine dependence, cigarettes, uncomplicated: Secondary | ICD-10-CM | POA: Insufficient documentation

## 2017-12-23 DIAGNOSIS — R112 Nausea with vomiting, unspecified: Secondary | ICD-10-CM | POA: Insufficient documentation

## 2017-12-23 LAB — COMPREHENSIVE METABOLIC PANEL
ALT: 16 U/L (ref 14–54)
ANION GAP: 11 (ref 5–15)
AST: 18 U/L (ref 15–41)
Albumin: 4.2 g/dL (ref 3.5–5.0)
Alkaline Phosphatase: 84 U/L (ref 38–126)
BILIRUBIN TOTAL: 0.7 mg/dL (ref 0.3–1.2)
BUN: 10 mg/dL (ref 6–20)
CHLORIDE: 104 mmol/L (ref 101–111)
CO2: 26 mmol/L (ref 22–32)
Calcium: 10.1 mg/dL (ref 8.9–10.3)
Creatinine, Ser: 0.74 mg/dL (ref 0.44–1.00)
Glucose, Bld: 147 mg/dL — ABNORMAL HIGH (ref 65–99)
POTASSIUM: 3.8 mmol/L (ref 3.5–5.1)
Sodium: 141 mmol/L (ref 135–145)
TOTAL PROTEIN: 8.4 g/dL — AB (ref 6.5–8.1)

## 2017-12-23 LAB — CBC
HEMATOCRIT: 45.9 % (ref 36.0–46.0)
HEMOGLOBIN: 14.8 g/dL (ref 12.0–15.0)
MCH: 29.5 pg (ref 26.0–34.0)
MCHC: 32.2 g/dL (ref 30.0–36.0)
MCV: 91.4 fL (ref 78.0–100.0)
Platelets: 301 10*3/uL (ref 150–400)
RBC: 5.02 MIL/uL (ref 3.87–5.11)
RDW: 14.6 % (ref 11.5–15.5)
WBC: 16.4 10*3/uL — AB (ref 4.0–10.5)

## 2017-12-23 LAB — LIPASE, BLOOD: LIPASE: 22 U/L (ref 11–51)

## 2017-12-23 MED ORDER — MORPHINE SULFATE (PF) 4 MG/ML IV SOLN
4.0000 mg | Freq: Once | INTRAVENOUS | Status: AC
  Administered 2017-12-23: 4 mg via INTRAVENOUS
  Filled 2017-12-23: qty 1

## 2017-12-23 MED ORDER — DICYCLOMINE HCL 10 MG PO CAPS
10.0000 mg | ORAL_CAPSULE | Freq: Once | ORAL | Status: AC
  Administered 2017-12-23: 10 mg via ORAL
  Filled 2017-12-23: qty 1

## 2017-12-23 MED ORDER — ONDANSETRON 4 MG PO TBDP: 4.0000 mg | ORAL_TABLET | Freq: Three times a day (TID) | ORAL | 0 refills | Status: DC | PRN

## 2017-12-23 MED ORDER — KETOROLAC TROMETHAMINE 60 MG/2ML IM SOLN
INTRAMUSCULAR | Status: AC
  Administered 2017-12-23: 30 mg
  Filled 2017-12-23: qty 2

## 2017-12-23 MED ORDER — KETOROLAC TROMETHAMINE 30 MG/ML IJ SOLN: 30.0000 mg | Freq: Once | INTRAMUSCULAR | Status: DC

## 2017-12-23 MED ORDER — SODIUM CHLORIDE 0.9 % IV BOLUS
1000.0000 mL | Freq: Once | INTRAVENOUS | Status: AC
  Administered 2017-12-23: 1000 mL via INTRAVENOUS

## 2017-12-23 MED ORDER — DICYCLOMINE HCL 20 MG PO TABS: 20.0000 mg | ORAL_TABLET | Freq: Three times a day (TID) | ORAL | 0 refills | Status: DC | PRN

## 2017-12-23 MED ORDER — IOPAMIDOL (ISOVUE-300) INJECTION 61%
100.0000 mL | Freq: Once | INTRAVENOUS | Status: AC | PRN
  Administered 2017-12-23: 100 mL via INTRAVENOUS

## 2017-12-23 MED ORDER — ONDANSETRON HCL 4 MG/2ML IJ SOLN
4.0000 mg | Freq: Once | INTRAMUSCULAR | Status: AC
  Administered 2017-12-23: 4 mg via INTRAVENOUS
  Filled 2017-12-23: qty 2

## 2017-12-23 NOTE — ED Notes (Signed)
Pt states she feels like she needs to restroom, but when she tries, she cannot go.

## 2017-12-23 NOTE — ED Notes (Signed)
Pt states she does not need to urinate at this time, aware of DO  

## 2017-12-23 NOTE — ED Triage Notes (Signed)
Pt c/o of headache, n/v/v and abdominal pain. Denies fever.

## 2017-12-23 NOTE — ED Provider Notes (Signed)
  Physical Exam  BP 128/69   Pulse 78   Temp 98.9 F (37.2 C) (Oral)   Resp 18   Ht  (1.676 m)   Wt (!) 172.4 kg (380 lb)   SpO2 98%   BMI 61.33 kg/m   Physical Exam  ED Course/Procedures     Procedures  MDM  Patient with nausea vomiting abdominal pain.  CT scan lab work reassuring.  Patient has not given urine yet however.  She has no dysuria and after discussion we will defer on the urine for now.  Discharged home with antiemetics and antispasmodics.  Follow-up with PCP as needed.       Benjiman Core, MD 12/23/17 2238

## 2017-12-23 NOTE — Discharge Instructions (Signed)

## 2017-12-23 NOTE — ED Provider Notes (Signed)
Emergency Department Provider Note   I have reviewed the triage vital signs and the nursing notes.   HISTORY  Chief Complaint Abdominal Pain   HPI Sandra Orr is a 47 y.o. female with PMH of obesity and arthritis presents to the ED for evaluation of acute onset diffuse abdominal soreness with nausea, vomiting, diarrhea.  The patient has developed an associated frontal headache as well.  She denies any fevers or shaking chills.  States the headache began as mild discomfort and is gradually worsened throughout the day.  No sick contacts or recent travels.  She describes her abdominal pain as generalized soreness with no radiation.  She has vomiting if she tries to eat or drink anything.  She denies any blood in the emesis or diarrhea.   Past Medical History:  Diagnosis Date  . Arthritis   . Back pain   . Depression   . Obesity     Patient Active Problem List   Diagnosis Date Noted  . Primary localized osteoarthritis of knees, bilateral 12/22/2017  . Chronic pain of left knee 08/03/2016  . 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 Abdominal pain 10/18/2015  . Constipation 10/18/2015  . Rash and nonspecific skin eruption 10/18/2015  . Bilateral knee pain 07/15/2015  . Carpal tunnel syndrome 07/15/2015  . Homelessness 05/30/2015  . Essential hypertension, benign 05/30/2015  . DM type 2 (diabetes mellitus, type 2) (HCC) 02/04/2015  . Morbid obesity (HCC) 05/04/2012  . Depression 05/04/2012  . Chronic pain 05/04/2012  . Tobacco abuse 05/04/2012    Past Surgical History:  Procedure Laterality Date  . CHOLECYSTECTOMY    . TUBAL LIGATION      Current Outpatient Rx  . Order #: [id number] Class: Normal    Allergies Penicillins  Family History  Problem Relation Age of Onset  . Depression Mother        bipolar/schizo  . Heart disease Mother   . Diabetes Mother   . Asthma Father   . Hyperlipidemia Father   . Hypertension Father   . Depression Maternal  Grandmother   . Heart disease Maternal Grandmother   . Diabetes Maternal Grandmother   . Hypertension Maternal Grandfather   . Breast cancer Paternal Aunt     Social History Social History   Tobacco Use  . Smoking status: Current Every Day Smoker    Packs/day: 0.50    Years: 20.00    Pack years: 10.00    Types: Cigarettes  . Smokeless tobacco: Never Used  Substance Use Topics  . Alcohol use: No    Alcohol/week: 0.0 oz  . Drug use: Yes    Frequency: 1.0 times per week    Types: Marijuana    Comment: twice a month    Review of Systems  Constitutional: No fever/chills Eyes: No visual changes. ENT: No sore throat. Cardiovascular: Denies chest pain. Respiratory: Denies shortness of breath. Gastrointestinal: Positive diffuse abdominal pain. Positive nausea, vomiting, and diarrhea.  No constipation. Genitourinary: Negative for dysuria. Musculoskeletal: Negative for back pain. Skin: Negative for rash. Neurological: Negative for headaches, focal weakness or numbness.  10-point ROS otherwise negative.  ____________________________________________   PHYSICAL EXAM:  VITAL SIGNS: ED Triage Vitals  Enc Vitals Group     BP 12/23/17 1647 (!) 157/84     Pulse Rate 12/23/17 1647 76     Resp 12/23/17 1647 16     Temp 12/23/17 1647 98.9 F (37.2 C)     Temp Source 12/23/17 1647 Oral  SpO2 12/23/17 1647 97 %     Weight 12/23/17 1649 (!) 380 lb (172.4 kg)     Height 12/23/17 1649  (1.676 m)     Pain Score 12/23/17 1647 9   Constitutional: Alert and oriented. Well appearing and in no acute distress. Eyes: Conjunctivae are normal.  Head: Atraumatic. Nose: No congestion/rhinnorhea. Mouth/Throat: Mucous membranes are dry.  Neck: No stridor. Cardiovascular: Normal rate, regular rhythm. Good peripheral circulation. Grossly normal heart sounds.   Respiratory: Normal respiratory effort.  No retractions. Lungs CTAB. Gastrointestinal: Soft and nontender. No distention.    Musculoskeletal: No lower extremity tenderness nor edema. No gross deformities of extremities. Neurologic:  Normal speech and language. No gross focal neurologic deficits are appreciated.  Skin:  Skin is warm, dry and intact. No rash noted.  ____________________________________________   LABS (all labs ordered are listed, but only abnormal results are displayed)  Labs Reviewed  COMPREHENSIVE METABOLIC PANEL - Abnormal; Notable for the following components:      Result Value   Glucose, Bld 147 (*)    Total Protein 8.4 (*)    All other components within normal limits  CBC - Abnormal; Notable for the following components:   WBC 16.4 (*)    All other components within normal limits  LIPASE, BLOOD  URINALYSIS, ROUTINE W REFLEX MICROSCOPIC   ____________________________________________  RADIOLOGY  CT scan pending ____________________________________________   PROCEDURES  Procedure(s) performed:   Procedures  None ____________________________________________   INITIAL IMPRESSION / ASSESSMENT AND PLAN / ED COURSE  Pertinent labs & imaging results that were available during my care of the patient were reviewed by me and considered in my medical decision making (see chart for details).  Patient presents the emergency department for evaluation of diffuse abdominal discomfort with nausea, vomiting, diarrhea.  She has a moderate frontal headache.  No fevers.  No concern for meningitis clinically.  Patient has very mild diffuse tenderness.  Symptoms are most consistent with a viral infection.  Plan for labs, IV fluids, Zofran, Toradol, Bentyl.  No indication for abdominal imaging at this time but will follow labs and reassess as needed.  Patient pain is worsening. Labs reviewed with leukocytosis. No other acute findings. Plan for CT abdomen/pelvis and re-evaluation/PO challenge if negative.   Care transferred to Dr. Rubin Payor to follow CT results.   ____________________________________________  FINAL CLINICAL IMPRESSION(S) / ED DIAGNOSES  Final diagnoses:  Generalized abdominal pain  Nausea vomiting and diarrhea     MEDICATIONS GIVEN DURING THIS VISIT:  Medications  ketorolac (TORADOL) 30 MG/ML injection 30 mg (30 mg Intravenous Not Given 12/23/17 1802)  dicyclomine (BENTYL) capsule 10 mg (10 mg Oral Given 12/23/17 1756)  ondansetron (ZOFRAN) injection 4 mg (4 mg Intravenous Given 12/23/17 1758)  sodium chloride 0.9 % bolus 1,000 mL (0 mLs Intravenous Stopped 12/23/17 1939)  ketorolac (TORADOL) 60 MG/2ML injection (30 mg  Given 12/23/17 1801)  morphine 4 MG/ML injection 4 mg (4 mg Intravenous Given 12/23/17 1920)  iopamidol (ISOVUE-300) 61 % injection 100 mL (100 mLs Intravenous Contrast Given 12/23/17 2140)    Note:  This document was prepared using Dragon voice recognition software and may include unintentional dictation errors.  Alona Bene, MD Emergency Medicine    Kyonna Frier, Arlyss Repress, MD 12/23/17 2232

## 2017-12-24 ENCOUNTER — Emergency Department (HOSPITAL_COMMUNITY): Payer: Self-pay

## 2017-12-24 ENCOUNTER — Encounter (HOSPITAL_COMMUNITY): Payer: Self-pay

## 2017-12-24 ENCOUNTER — Emergency Department (HOSPITAL_COMMUNITY)
Admission: EM | Admit: 2017-12-24 | Discharge: 2017-12-25 | Disposition: A | Discharge status: Home / Self Care | Payer: Self-pay | Attending: Emergency Medicine | Admitting: Emergency Medicine

## 2017-12-24 ENCOUNTER — Other Ambulatory Visit: Payer: Self-pay

## 2017-12-24 DIAGNOSIS — R112 Nausea with vomiting, unspecified: Secondary | ICD-10-CM | POA: Insufficient documentation

## 2017-12-24 DIAGNOSIS — F17210 Nicotine dependence, cigarettes, uncomplicated: Secondary | ICD-10-CM | POA: Insufficient documentation

## 2017-12-24 DIAGNOSIS — Z79899 Other long term (current) drug therapy: Secondary | ICD-10-CM | POA: Insufficient documentation

## 2017-12-24 DIAGNOSIS — E119 Type 2 diabetes mellitus without complications: Secondary | ICD-10-CM | POA: Insufficient documentation

## 2017-12-24 LAB — URINALYSIS, ROUTINE W REFLEX MICROSCOPIC
BILIRUBIN URINE: NEGATIVE
Glucose, UA: NEGATIVE mg/dL
Ketones, ur: NEGATIVE mg/dL
NITRITE: NEGATIVE
PH: 5 (ref 5.0–8.0)
Protein, ur: NEGATIVE mg/dL
SPECIFIC GRAVITY, URINE: 1.032 — AB (ref 1.005–1.030)

## 2017-12-24 LAB — COMPREHENSIVE METABOLIC PANEL
ALT: 35 U/L (ref 14–54)
ANION GAP: 6 (ref 5–15)
AST: 25 U/L (ref 15–41)
Albumin: 4 g/dL (ref 3.5–5.0)
Alkaline Phosphatase: 77 U/L (ref 38–126)
BUN: 16 mg/dL (ref 6–20)
CO2: 29 mmol/L (ref 22–32)
Calcium: 9.3 mg/dL (ref 8.9–10.3)
Chloride: 103 mmol/L (ref 101–111)
Creatinine, Ser: 0.9 mg/dL (ref 0.44–1.00)
Glucose, Bld: 108 mg/dL — ABNORMAL HIGH (ref 65–99)
POTASSIUM: 3.6 mmol/L (ref 3.5–5.1)
Sodium: 138 mmol/L (ref 135–145)
TOTAL PROTEIN: 7.9 g/dL (ref 6.5–8.1)
Total Bilirubin: 0.5 mg/dL (ref 0.3–1.2)

## 2017-12-24 LAB — CBC
HEMATOCRIT: 45.6 % (ref 36.0–46.0)
Hemoglobin: 14.4 g/dL (ref 12.0–15.0)
MCH: 29 pg (ref 26.0–34.0)
MCHC: 31.6 g/dL (ref 30.0–36.0)
MCV: 91.8 fL (ref 78.0–100.0)
Platelets: 284 10*3/uL (ref 150–400)
RBC: 4.97 MIL/uL (ref 3.87–5.11)
RDW: 14.7 % (ref 11.5–15.5)
WBC: 12.4 10*3/uL — AB (ref 4.0–10.5)

## 2017-12-24 LAB — I-STAT CG4 LACTIC ACID, ED: Lactic Acid, Venous: 1.4 mmol/L (ref 0.5–1.9)

## 2017-12-24 LAB — I-STAT BETA HCG BLOOD, ED (MC, WL, AP ONLY)

## 2017-12-24 LAB — LIPASE, BLOOD: Lipase: 27 U/L (ref 11–51)

## 2017-12-24 MED ORDER — PROMETHAZINE HCL 25 MG PO TABS: 25.0000 mg | ORAL_TABLET | Freq: Four times a day (QID) | ORAL | 0 refills | Status: DC | PRN

## 2017-12-24 MED ORDER — SODIUM CHLORIDE 0.9 % IV SOLN
1000.0000 mL | INTRAVENOUS | Status: DC
  Administered 2017-12-24: 1000 mL via INTRAVENOUS

## 2017-12-24 MED ORDER — PROMETHAZINE HCL 25 MG/ML IJ SOLN
25.0000 mg | Freq: Once | INTRAMUSCULAR | Status: AC
  Administered 2017-12-24: 25 mg via INTRAVENOUS
  Filled 2017-12-24: qty 1

## 2017-12-24 MED ORDER — SODIUM CHLORIDE 0.9 % IV BOLUS (SEPSIS)
1000.0000 mL | Freq: Once | INTRAVENOUS | Status: AC
  Administered 2017-12-24: 1000 mL via INTRAVENOUS

## 2017-12-24 MED ORDER — ONDANSETRON HCL 4 MG/2ML IJ SOLN
4.0000 mg | Freq: Once | INTRAMUSCULAR | Status: AC
  Administered 2017-12-25: 4 mg via INTRAVENOUS
  Filled 2017-12-24: qty 2

## 2017-12-24 NOTE — ED Triage Notes (Signed)
Pt reports abd pain, nausea, and vomiting that started Wednesday. Pt was seen here yesterday for the same and discharged with Rx of Bentyl and Zofran (ODT), but has been unable to take due to nausea and vomiting. Pt reports inability to keep down food or liquids.

## 2017-12-24 NOTE — ED Provider Notes (Signed)
[facility] EMERGENCY DEPARTMENT Provider Note   CSN: [id number] Arrival date & time: 12/24/17  2011     History   Chief Complaint Chief Complaint  Patient presents with  . Abdominal Pain  . Nausea    HPI Sandra Orr is a 47 y.o. female.  HPI  47 yo female with nausea vomiting and diarrhea began on Mnday.  Seen here yesterday and evaluated with labs, ct abdomen.  She presents today with continued vomiting.  No fever but chills.  No sick contact.  Unable to void yesterday and feels she has vomited evertything today.  She has used zofran without relief.   Past Medical History:  Diagnosis Date  . Arthritis   . Back pain   . Depression   . Obesity     Patient Active Problem List   Diagnosis Date Noted  . Primary localized osteoarthritis of knees, bilateral 12/22/2017  . Chronic pain of left knee 08/03/2016  . 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 Abdominal pain 10/18/2015  . Constipation 10/18/2015  . Rash and nonspecific skin eruption 10/18/2015  . Bilateral knee pain 07/15/2015  . Carpal tunnel syndrome 07/15/2015  . Homelessness 05/30/2015  . Essential hypertension, benign 05/30/2015  . DM type 2 (diabetes mellitus, type 2) (HCC) 02/04/2015  . Morbid obesity (HCC) 05/04/2012  . Depression 05/04/2012  . Chronic pain 05/04/2012  . Tobacco abuse 05/04/2012    Past Surgical History:  Procedure Laterality Date  . CHOLECYSTECTOMY    . TUBAL LIGATION       OB History    Gravida  3   Para  3   Term  3   Preterm      AB      Living  3     SAB      TAB      Ectopic      Multiple      Live Births               Home Medications    Prior to Admission medications   Medication Sig Start Date End Date Taking? Authorizing Provider  dicyclomine (BENTYL) 20 MG tablet Take 1 tablet (20 mg total) by mouth 3 (three) times daily as needed for spasms. 12/23/17   Benjiman Core, MD  meloxicam (MOBIC) 15 MG tablet Take 1 tablet (15 mg total)  by mouth daily. 10/27/17   McKeag, Janine Ores, MD  ondansetron (ZOFRAN-ODT) 4 MG disintegrating tablet Take 1 tablet (4 mg total) by mouth every 8 (eight) hours as needed for nausea or vomiting. 12/23/17   Benjiman Core, MD    Family History Family History  Problem Relation Age of Onset  . Depression Mother        bipolar/schizo  . Heart disease Mother   . Diabetes Mother   . Asthma Father   . Hyperlipidemia Father   . Hypertension Father   . Depression Maternal Grandmother   . Heart disease Maternal Grandmother   . Diabetes Maternal Grandmother   . Hypertension Maternal Grandfather   . Breast cancer Paternal Aunt     Social History Social History   Tobacco Use  . Smoking status: Current Every Day Smoker    Packs/day: 0.50    Years: 20.00    Pack years: 10.00    Types: Cigarettes  . Smokeless tobacco: Never Used  Substance Use Topics  . Alcohol use: No    Alcohol/week: 0.0 oz  . Drug use: Yes    Frequency: 1.0  times per week    Types: Marijuana    Comment: twice a month     Allergies   Penicillins   Review of Systems Review of Systems  Constitutional: Negative.   Respiratory: Positive for cough.   Genitourinary: Negative for difficulty urinating, dysuria, vaginal discharge and vaginal pain.       Not sexually active for over 20 years  All other systems reviewed and are negative.    Physical Exam Updated Vital Signs BP (!) 143/84 (BP Location: Left Arm)   Pulse (!) 52   Temp 98.1 F (36.7 C) (Oral)   Resp 20   Ht 1.676 m ( )   Wt (!) 172.4 kg (380 lb)   LMP 05/06/2016   SpO2 99%   BMI 61.33 kg/m   Physical Exam  Constitutional: She is oriented to person, place, and time. She appears well-developed and well-nourished.  HENT:  Head: Normocephalic and atraumatic.  Mouth/Throat: Oropharynx is clear and moist.  Eyes: Pupils are equal, round, and reactive to light.  Cardiovascular: Normal rate, regular rhythm, normal heart sounds and intact distal  pulses.  Pulmonary/Chest: Effort normal.  Abdominal: Soft. Normal appearance. Bowel sounds are increased. There is no tenderness.  Neurological: She is alert and oriented to person, place, and time.  Skin: Skin is warm and dry. Capillary refill takes less than 2 seconds.  Psychiatric: She has a normal mood and affect.  Nursing note and vitals reviewed.    ED Treatments / Results  Labs (all labs ordered are listed, but only abnormal results are displayed) Labs Reviewed  COMPREHENSIVE METABOLIC PANEL - Abnormal; Notable for the following components:      Result Value   Glucose, Bld 108 (*)    All other components within normal limits  CBC - Abnormal; Notable for the following components:   WBC 12.4 (*)    All other components within normal limits  CULTURE, BLOOD (ROUTINE X 2)  CULTURE, BLOOD (ROUTINE X 2)  LIPASE, BLOOD  URINALYSIS, ROUTINE W REFLEX MICROSCOPIC  I-STAT BETA HCG BLOOD, ED ([facility], [facility], AP ONLY)  I-STAT CG4 LACTIC ACID, ED    EKG None  Radiology Ct Abdomen Pelvis W Contrast  Result Date: 12/23/2017 CLINICAL DATA:  Nausea and vomiting and upper abdominal pain for a few days. EXAM: CT ABDOMEN AND PELVIS WITH CONTRAST TECHNIQUE: Multidetector CT imaging of the abdomen and pelvis was performed using the standard protocol following bolus administration of intravenous contrast. CONTRAST:  ISOVUE-300 IOPAMIDOL (ISOVUE-300) INJECTION 61% COMPARISON:  06/23/2016 FINDINGS: Lower chest: Clear lung bases.  Heart borderline enlarged. Hepatobiliary: No focal liver abnormality is seen. Status post cholecystectomy. No biliary dilatation. Pancreas: Unremarkable. No pancreatic ductal dilatation or surrounding inflammatory changes. Spleen: Normal in size without focal abnormality. Adrenals/Urinary Tract: Adrenal glands are unremarkable. Kidneys are normal, without renal calculi, focal lesion, or hydronephrosis. Bladder is unremarkable. Stomach/Bowel: Stomach is within normal limits.  Appendix appears normal. No evidence of bowel wall thickening, distention, or inflammatory changes. Vascular/Lymphatic: Minimal distal abdominal aortic atherosclerotic calcifications. No aneurysm. No enlarged lymph nodes. Reproductive: Uterus and bilateral adnexa are unremarkable. Other: No abdominal wall hernia or abnormality. No abdominopelvic ascites. Musculoskeletal: No fracture or acute finding. No osteoblastic or osteolytic lesions. Mild disc degenerative changes noted along the lower thoracic and lumbar spine. IMPRESSION: 1. No acute findings within the abdomen or pelvis. No findings to account for the patient's symptoms. 2. Status post cholecystectomy. 3. Minimal aortic atherosclerosis. Electronically Signed   By: Renard Hamper.D.  On: 12/23/2017 22:05    Procedures Procedures (including critical care time)  Medications Ordered in ED Medications  sodium chloride 0.9 % bolus 1,000 mL (1,000 mLs Intravenous New Bag/Given 12/24/17 2227)    Followed by  sodium chloride 0.9 % bolus 1,000 mL (1,000 mLs Intravenous New Bag/Given 12/24/17 2227)    Followed by  0.9 %  sodium chloride infusion (1,000 mLs Intravenous New Bag/Given 12/24/17 2227)  promethazine (PHENERGAN) injection 25 mg (has no administration in time range)     Initial Impression / Assessment and Plan / ED Course  I have reviewed the triage vital signs and the nursing notes.  Pertinent labs & imaging results that were available during my care of the patient were reviewed by me and considered in my medical decision making (see chart for details).    IV fluids infusing Phenergan ordered ua pending Patient improved after Phenergan and tolerating fluids.  Final Clinical Impressions(s) / ED Diagnoses   Final diagnoses:  Nausea and vomiting, intractability of vomiting not specified, unspecified vomiting type    ED Discharge Orders    None       Margarita Grizzle, MD 12/24/17 2355

## 2017-12-24 NOTE — Discharge Instructions (Signed)
Use Phenergan as needed for nausea Use Gatorade mixed half-and-half with water and take frequent sips up to every 10 to 15 minutes Return if you have worsening pain or fever. Recheck with your doctor as soon as possible

## 2017-12-24 NOTE — ED Notes (Signed)
Patient transported to X-ray 

## 2017-12-25 LAB — I-STAT CG4 LACTIC ACID, ED: Lactic Acid, Venous: 0.7 mmol/L (ref 0.5–1.9)

## 2017-12-25 NOTE — ED Notes (Signed)
Pt took 4 sips of water and then stopped saying she did not want anymore. She felt like she would get sick if she tried another sip.

## 2017-12-29 LAB — CULTURE, BLOOD (ROUTINE X 2)
CULTURE: NO GROWTH
Culture: NO GROWTH
SPECIAL REQUESTS: ADEQUATE
SPECIAL REQUESTS: ADEQUATE

## 2018-01-05 ENCOUNTER — Ambulatory Visit: Payer: Self-pay | Admitting: Family Medicine

## 2018-01-19 ENCOUNTER — Ambulatory Visit: Payer: Self-pay | Admitting: Family Medicine

## 2018-01-26 ENCOUNTER — Ambulatory Visit: Payer: Self-pay | Admitting: Family Medicine

## 2018-03-02 ENCOUNTER — Ambulatory Visit: Payer: Self-pay | Admitting: Family Medicine

## 2018-03-16 ENCOUNTER — Other Ambulatory Visit: Payer: Self-pay

## 2018-03-16 ENCOUNTER — Encounter (HOSPITAL_COMMUNITY): Payer: Self-pay | Admitting: *Deleted

## 2018-03-16 ENCOUNTER — Emergency Department (HOSPITAL_COMMUNITY): Payer: Self-pay

## 2018-03-16 ENCOUNTER — Emergency Department (HOSPITAL_COMMUNITY)
Admission: EM | Admit: 2018-03-16 | Discharge: 2018-03-17 | Disposition: A | Discharge status: Home / Self Care | Payer: Self-pay | Attending: Emergency Medicine | Admitting: Emergency Medicine

## 2018-03-16 DIAGNOSIS — F17210 Nicotine dependence, cigarettes, uncomplicated: Secondary | ICD-10-CM | POA: Insufficient documentation

## 2018-03-16 DIAGNOSIS — E119 Type 2 diabetes mellitus without complications: Secondary | ICD-10-CM | POA: Insufficient documentation

## 2018-03-16 DIAGNOSIS — I10 Essential (primary) hypertension: Secondary | ICD-10-CM | POA: Insufficient documentation

## 2018-03-16 DIAGNOSIS — J9801 Acute bronchospasm: Secondary | ICD-10-CM | POA: Insufficient documentation

## 2018-03-16 DIAGNOSIS — Z79899 Other long term (current) drug therapy: Secondary | ICD-10-CM | POA: Insufficient documentation

## 2018-03-16 DIAGNOSIS — J209 Acute bronchitis, unspecified: Secondary | ICD-10-CM

## 2018-03-16 MED ORDER — ALBUTEROL SULFATE (2.5 MG/3ML) 0.083% IN NEBU
5.0000 mg | INHALATION_SOLUTION | Freq: Once | RESPIRATORY_TRACT | Status: AC
  Administered 2018-03-17: 5 mg via RESPIRATORY_TRACT
  Filled 2018-03-16: qty 6

## 2018-03-16 MED ORDER — PREDNISONE 50 MG PO TABS
60.0000 mg | ORAL_TABLET | Freq: Once | ORAL | Status: AC
  Administered 2018-03-17: 60 mg via ORAL
  Filled 2018-03-16: qty 1

## 2018-03-16 NOTE — ED Triage Notes (Signed)
Pt c/o generalized body aches with n/v and cough and chest pain x 3 days; pt states she has been taking otc meds with no relief

## 2018-03-17 LAB — CBG MONITORING, ED: GLUCOSE-CAPILLARY: 113 mg/dL — AB (ref 70–99)

## 2018-03-17 MED ORDER — ALBUTEROL SULFATE HFA 108 (90 BASE) MCG/ACT IN AERS
INHALATION_SPRAY | RESPIRATORY_TRACT | Status: AC
  Filled 2018-03-17: qty 6.7

## 2018-03-17 MED ORDER — PREDNISONE 20 MG PO TABS: ORAL_TABLET | ORAL | 0 refills | Status: DC

## 2018-03-17 MED ORDER — AZITHROMYCIN 250 MG PO TABS: 250.0000 mg | ORAL_TABLET | Freq: Every day | ORAL | 0 refills | Status: DC

## 2018-03-17 MED ORDER — AEROCHAMBER Z-STAT PLUS/MEDIUM MISC
1.0000 | Freq: Once | Status: AC
  Administered 2018-03-17: 1

## 2018-03-17 MED ORDER — ALBUTEROL SULFATE HFA 108 (90 BASE) MCG/ACT IN AERS
2.0000 | INHALATION_SPRAY | Freq: Four times a day (QID) | RESPIRATORY_TRACT | Status: DC | PRN
  Administered 2018-03-17: 2 via RESPIRATORY_TRACT

## 2018-03-17 NOTE — ED Provider Notes (Signed)
[facility] EMERGENCY DEPARTMENT Provider Note   CSN: [id number] Arrival date & time: 03/16/18  2034  Time seen 23:35 PM    History   Chief Complaint Chief Complaint  Patient presents with  . Cough    HPI Sandra Orr is a 47 y.o. female.  HPI patient states 3 days ago she had a "common cold".  She states she had chest and nose congestion and had a headache that she states was behind her eyes and her forehead.  She states it felt like tightness.  She started taking cough syrup and Tylenol Cold and sinus and she felt better the next day.  She states last night however she vomited twice and she has been coughing up some yellow mucus.  She denies any nasal drainage but states she is having postnasal drip.  She has had subjective fever of the last couple days.  She denies short of breath, she states she sometimes feels short of breath but not now.  She has been wheezing and states she has had to use an inhaler before.  Patient currently smokes.  Nobody else has been sick.  Patient states she is normally placed on prednisone when she has flareups.  She denies having diabetes.  PCP Tillman Sers, DO   Past Medical History:  Diagnosis Date  . Arthritis   . Back pain   . Depression   . Obesity     Patient Active Problem List   Diagnosis Date Noted  . Primary localized osteoarthritis of knees, bilateral 12/22/2017  . Chronic pain of left knee 08/03/2016  . 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 Abdominal pain 10/18/2015  . Constipation 10/18/2015  . Rash and nonspecific skin eruption 10/18/2015  . Bilateral knee pain 07/15/2015  . Carpal tunnel syndrome 07/15/2015  . Homelessness 05/30/2015  . Essential hypertension, benign 05/30/2015  . DM type 2 (diabetes mellitus, type 2) (HCC) 02/04/2015  . Morbid obesity (HCC) 05/04/2012  . Depression 05/04/2012  . Chronic pain 05/04/2012  . Tobacco abuse 05/04/2012    Past Surgical History:  Procedure Laterality Date  .  CHOLECYSTECTOMY    . TUBAL LIGATION       OB History    Gravida  3   Para  3   Term  3   Preterm      AB      Living  3     SAB      TAB      Ectopic      Multiple      Live Births               Home Medications    None per patient, lost her inhaler  Prior to Admission medications   Medication Sig Start Date End Date Taking? Authorizing Provider  azithromycin (ZITHROMAX) 250 MG tablet Take 1 tablet (250 mg total) by mouth daily. 03/17/18   Devoria Albe, MD  dicyclomine (BENTYL) 20 MG tablet Take 1 tablet (20 mg total) by mouth 3 (three) times daily as needed for spasms. 12/23/17   Benjiman Core, MD  meloxicam (MOBIC) 15 MG tablet Take 1 tablet (15 mg total) by mouth daily. 10/27/17   McKeag, Janine Ores, MD  ondansetron (ZOFRAN-ODT) 4 MG disintegrating tablet Take 1 tablet (4 mg total) by mouth every 8 (eight) hours as needed for nausea or vomiting. 12/23/17   Benjiman Core, MD  predniSONE (DELTASONE) 20 MG tablet Take 3 po QD x 3d , then 2 po QD  x 3d then 1 po QD x 3d 03/17/18   Devoria Albe, MD  promethazine (PHENERGAN) 25 MG tablet Take 1 tablet (25 mg total) by mouth every 6 (six) hours as needed for nausea or vomiting. 12/24/17   Margarita Grizzle, MD    Family History Family History  Problem Relation Age of Onset  . Depression Mother        bipolar/schizo  . Heart disease Mother   . Diabetes Mother   . Asthma Father   . Hyperlipidemia Father   . Hypertension Father   . Depression Maternal Grandmother   . Heart disease Maternal Grandmother   . Diabetes Maternal Grandmother   . Hypertension Maternal Grandfather   . Breast cancer Paternal Aunt     Social History Social History   Tobacco Use  . Smoking status: Current Every Day Smoker    Packs/day: 0.50    Years: 20.00    Pack years: 10.00    Types: Cigarettes  . Smokeless tobacco: Never Used  Substance Use Topics  . Alcohol use: No    Alcohol/week: 0.0 oz  . Drug use: Yes    Frequency: 1.0 times per  week    Types: Marijuana    Comment: twice a month  applying for disability for arthritis   Allergies   Penicillins   Review of Systems Review of Systems  All other systems reviewed and are negative.    Physical Exam Updated Vital Signs BP (!) 142/63 (BP Location: Right Arm)   Pulse 63   Temp 97.8 F (36.6 C) (Tympanic)   Resp 20   Ht 5\' 6"  (1.676 m)   Wt (!) 163.3 kg (360 lb)   LMP 05/06/2016   SpO2 95%   BMI 58.11 kg/m   Vital signs normal    Physical Exam  Constitutional: She is oriented to person, place, and time. She appears well-developed and well-nourished.  Non-toxic appearance. She does not appear ill. No distress.  Morbidly obese  HENT:  Head: Normocephalic and atraumatic.  Right Ear: External ear normal.  Left Ear: External ear normal.  Nose: Nose normal. No mucosal edema or rhinorrhea.  Mouth/Throat: Oropharynx is clear and moist and mucous membranes are normal. No dental abscesses or uvula swelling.  Eyes: Pupils are equal, round, and reactive to light. Conjunctivae and EOM are normal.  Neck: Normal range of motion and full passive range of motion without pain. Neck supple.  Cardiovascular: Normal rate, regular rhythm and normal heart sounds. Exam reveals no gallop and no friction rub.  No murmur heard. Pulmonary/Chest: Effort normal. No respiratory distress. She has decreased breath sounds. She has wheezes. She has no rhonchi. She has no rales. She exhibits no tenderness and no crepitus.  Abdominal: Soft. Normal appearance and bowel sounds are normal. She exhibits no distension. There is no tenderness. There is no rebound and no guarding.  Musculoskeletal: Normal range of motion. She exhibits no edema or tenderness.  Moves all extremities well.   Neurological: She is alert and oriented to person, place, and time. She has normal strength. No cranial nerve deficit.  Skin: Skin is warm, dry and intact. No rash noted. No erythema. No pallor.  Psychiatric:  She has a normal mood and affect. Her speech is normal and behavior is normal. Her mood appears not anxious.  Nursing note and vitals reviewed.    ED Treatments / Results  Labs (all labs ordered are listed, but only abnormal results are displayed) Labs Reviewed  CBG MONITORING, ED -  Abnormal; Notable for the following components:      Result Value   Glucose-Capillary 113 (*)    All other components within normal limits      EKG None  Radiology Dg Chest 2 View  Result Date: 03/16/2018 CLINICAL DATA:  Cough, body aches EXAM: CHEST - 2 VIEW COMPARISON:  12/24/2017 FINDINGS: Low lung volumes with pulmonary vascular congestion. No frank interstitial edema. The heart is top-normal in size for inspiration. Visualized osseous structures are within normal limits. IMPRESSION: No evidence of acute cardiopulmonary disease. Electronically Signed   By: Charline BillsSriyesh  Krishnan M.D.   On: 03/16/2018 21:35    Procedures Procedures (including critical care time)  Medications Ordered in ED Medications  albuterol (PROVENTIL HFA;VENTOLIN HFA) 108 (90 Base) MCG/ACT inhaler 2 puff (has no administration in time range)  aerochamber Z-Stat Plus/medium 1 each (has no administration in time range)  albuterol (PROVENTIL) (2.5 MG/3ML) 0.083% nebulizer solution 5 mg (5 mg Nebulization Given 03/17/18 0014)  predniSONE (DELTASONE) tablet 60 mg (60 mg Oral Given 03/17/18 0002)     Initial Impression / Assessment and Plan / ED Course  I have reviewed the triage vital signs and the nursing notes.  Pertinent labs & imaging results that were available during my care of the patient were reviewed by me and considered in my medical decision making (see chart for details).    She was given an albuterol nebulizer and prednisone for her bronchitis with bronchospasm.  Patient is a smoker so she will be discharged home with antibiotics although this is most likely going to be of viral illness her being a smoker increases her  chance of having a bacterial infection.  Recheck at 1 AM patient has had her nebulizer treatment, her lungs are now clear, there is no wheezing.  She has improved air movement.  She states she is feeling much improved.  She was given an albuterol inhaler for spacer.  She was discharged home on a Z-Pak and prednisone and encouraged to quit smoking  Final Clinical Impressions(s) / ED Diagnoses   Final diagnoses:  Acute bronchitis with bronchospasm    ED Discharge Orders        Ordered    predniSONE (DELTASONE) 20 MG tablet     03/17/18 0103    azithromycin (ZITHROMAX) 250 MG tablet  Daily     03/17/18 0103    OTC mucinex DM  Plan discharge  Devoria AlbeIva Lavanda Nevels, MD, Concha PyoFACEP    Saharah Sherrow, MD 03/17/18 [phone]

## 2018-03-17 NOTE — Discharge Instructions (Addendum)
Use the inhaler 2 puffs every 4-6 hrs for wheezing or shortness of breath. Take the antibiotic and prednisone until gone. TRY TO STOP SMOKING!!! You can take mucinex DM OTC for cough. Recheck if you get worse.

## 2018-03-23 ENCOUNTER — Encounter: Payer: Self-pay | Admitting: Family Medicine

## 2018-04-29 ENCOUNTER — Other Ambulatory Visit: Payer: Self-pay | Admitting: Family Medicine

## 2018-04-29 DIAGNOSIS — Z1231 Encounter for screening mammogram for malignant neoplasm of breast: Secondary | ICD-10-CM

## 2018-05-09 ENCOUNTER — Telehealth (HOSPITAL_COMMUNITY): Payer: Self-pay | Admitting: *Deleted

## 2018-05-09 NOTE — Telephone Encounter (Signed)
Telephoned patient at home number and left message to return call to [facility] 

## 2018-05-11 ENCOUNTER — Ambulatory Visit: Payer: Self-pay | Admitting: Family Medicine

## 2018-05-23 ENCOUNTER — Other Ambulatory Visit (HOSPITAL_COMMUNITY): Payer: Self-pay | Admitting: *Deleted

## 2018-05-23 DIAGNOSIS — Z1231 Encounter for screening mammogram for malignant neoplasm of breast: Secondary | ICD-10-CM

## 2018-07-26 ENCOUNTER — Ambulatory Visit (HOSPITAL_COMMUNITY)
Admission: RE | Admit: 2018-07-26 | Discharge: 2018-07-26 | Disposition: A | Discharge status: Home / Self Care | Payer: Self-pay | Source: Ambulatory Visit | Attending: Obstetrics and Gynecology | Admitting: Obstetrics and Gynecology

## 2018-07-26 ENCOUNTER — Encounter (HOSPITAL_COMMUNITY): Payer: Self-pay

## 2018-07-26 ENCOUNTER — Ambulatory Visit: Payer: Self-pay

## 2018-07-26 ENCOUNTER — Encounter (HOSPITAL_COMMUNITY): Payer: Self-pay | Admitting: *Deleted

## 2018-07-26 VITALS — BP 140/80 | Ht 65.0 in | Wt 385.0 lb

## 2018-07-26 DIAGNOSIS — Z01419 Encounter for gynecological examination (general) (routine) without abnormal findings: Secondary | ICD-10-CM

## 2018-07-26 NOTE — Progress Notes (Signed)
pa

## 2018-07-26 NOTE — Patient Instructions (Signed)
Explained breast self awareness with Sandra Orr. Let patient know [facility] will cover Pap smears and HPV typing every 5 years unless has a history of abnormal Pap smears. [facility]. Appointment scheduled for Tuesday, July 26, 2018 at 1100. Patient aware of appointment and will be there. Let patient know will follow up with her within the next couple weeks with results of Pap smear by letter or phone. Informed patient that the Breast Center will follow-up with her within the next couple of weeks with results of mammogram by letter or phone. Sandra AugustVeronica A Bazzle verbalized understanding.  Cyd Hostler, Kathaleen Maserhristine Poll, RN 10:17 AM

## 2018-07-26 NOTE — Progress Notes (Signed)
No complaints today.   Pap Smear: Pap smear completed today. [facility] and normal. Per patient has no history of an abnormal Pap smear. Last Pap smear result is in Epic.  Physical exam: Breasts Breasts symmetrical. No skin abnormalities bilateral breasts. No nipple retraction right breast. Left nipple inverted per patient is normal for her. No nipple discharge bilateral breasts. No lymphadenopathy. No lumps palpated bilateral breasts. No complaints of pain or tenderness on exam. [facility] mamm[facility]. Appointment scheduled for Tuesday, July 26, 2018 at 1100.        Pelvic/Bimanual   Ext Genitalia No lesions, no swelling and no discharge observed on external genitalia.         Vagina Vagina pink and normal texture. No lesions or discharge observed in vagina.          Cervix Cervix is present. Cervix pink and of normal texture. No discharge observed.     Uterus Uterus is present and palpable. Uterus in normal position and normal size.        Adnexae Bilateral ovaries present and unable to palpated. No tenderness on palpation.         Rectovaginal No rectal exam completed today since patient had no rectal complaints. No skin abnormalities observed on exam.    Smoking History: Patient is a current smoker. Discussion smoking cessation with patient. Referred to the [facility] and gave resources to the free smoking cessation classes at [facility].  Patient Navigation: Patient education provided. Access to services provided for patient through [facility] program.   Breast and Cervical Cancer Risk Assessment: Patient has no family history of breast cancer, known genetic mutations, or radiation treatment to the chest before age 47. Patient has no history of cervical dysplasia, immunocompromised, or DES exposure in-utero.  Risk Assessment    Risk Scores      07/26/2018   Last edited by: Priscille HeidelbergBrannock,  Kurstyn Larios P, RN   5-year risk: 0.8 %   Lifetime risk: 7 %

## 2018-07-27 ENCOUNTER — Telehealth (HOSPITAL_COMMUNITY): Payer: Self-pay | Admitting: *Deleted

## 2018-07-27 NOTE — Telephone Encounter (Signed)
Teleph[facility] patient at home [facility] Patient was confused about appointment. Patient will call and schedule screening mammogram.

## 2018-07-28 LAB — CYTOLOGY - PAP
Diagnosis: NEGATIVE
HPV: NOT DETECTED

## 2018-08-30 ENCOUNTER — Other Ambulatory Visit (HOSPITAL_COMMUNITY)
Admission: RE | Admit: 2018-08-30 | Discharge: 2018-08-30 | Disposition: A | Discharge status: Home / Self Care | Payer: Self-pay | Source: Ambulatory Visit | Attending: Physician Assistant | Admitting: Physician Assistant

## 2018-08-30 ENCOUNTER — Ambulatory Visit (HOSPITAL_COMMUNITY)
Admission: RE | Admit: 2018-08-30 | Discharge: 2018-08-30 | Disposition: A | Discharge status: Home / Self Care | Payer: Self-pay | Source: Ambulatory Visit | Attending: Physician Assistant | Admitting: Physician Assistant

## 2018-08-30 ENCOUNTER — Encounter: Payer: Self-pay | Admitting: Physician Assistant

## 2018-08-30 ENCOUNTER — Ambulatory Visit: Payer: Self-pay | Admitting: Physician Assistant

## 2018-08-30 VITALS — BP 130/62 | HR 58 | Temp 97.5°F | Ht 66.0 in | Wt 356.0 lb

## 2018-08-30 DIAGNOSIS — Z131 Encounter for screening for diabetes mellitus: Secondary | ICD-10-CM | POA: Insufficient documentation

## 2018-08-30 DIAGNOSIS — Z7689 Persons encountering health services in other specified circumstances: Secondary | ICD-10-CM

## 2018-08-30 DIAGNOSIS — M25561 Pain in right knee: Secondary | ICD-10-CM | POA: Insufficient documentation

## 2018-08-30 DIAGNOSIS — M170 Bilateral primary osteoarthritis of knee: Secondary | ICD-10-CM | POA: Insufficient documentation

## 2018-08-30 DIAGNOSIS — M25531 Pain in right wrist: Secondary | ICD-10-CM

## 2018-08-30 DIAGNOSIS — Z13220 Encounter for screening for lipoid disorders: Secondary | ICD-10-CM

## 2018-08-30 DIAGNOSIS — M25532 Pain in left wrist: Secondary | ICD-10-CM

## 2018-08-30 DIAGNOSIS — R69 Illness, unspecified: Secondary | ICD-10-CM | POA: Insufficient documentation

## 2018-08-30 DIAGNOSIS — Z6841 Body Mass Index (BMI) 40.0 and over, adult: Secondary | ICD-10-CM

## 2018-08-30 DIAGNOSIS — G8929 Other chronic pain: Secondary | ICD-10-CM

## 2018-08-30 DIAGNOSIS — F17200 Nicotine dependence, unspecified, uncomplicated: Secondary | ICD-10-CM

## 2018-08-30 DIAGNOSIS — M25562 Pain in left knee: Secondary | ICD-10-CM | POA: Insufficient documentation

## 2018-08-30 LAB — COMPREHENSIVE METABOLIC PANEL
ALBUMIN: 4 g/dL (ref 3.5–5.0)
ALT: 13 U/L (ref 0–44)
AST: 15 U/L (ref 15–41)
Alkaline Phosphatase: 83 U/L (ref 38–126)
Anion gap: 8 (ref 5–15)
BUN: 7 mg/dL (ref 6–20)
CO2: 26 mmol/L (ref 22–32)
Calcium: 9.2 mg/dL (ref 8.9–10.3)
Chloride: 103 mmol/L (ref 98–111)
Creatinine, Ser: 0.66 mg/dL (ref 0.44–1.00)
GFR calc Af Amer: >60 mL/min (ref >60)
GFR calc non Af Amer: >60 mL/min (ref >60)
Glucose, Bld: 87 mg/dL (ref 70–99)
Potassium: 3.9 mmol/L (ref 3.5–5.1)
Sodium: 137 mmol/L (ref 135–145)
Total Bilirubin: 0.6 mg/dL (ref 0.3–1.2)
Total Protein: 7.7 g/dL (ref 6.5–8.1)

## 2018-08-30 LAB — LIPID PANEL
Cholesterol: 174 mg/dL (ref 0–200)
HDL: 42 mg/dL (ref >40)
LDL CALC: 116 mg/dL — AB (ref 0–99)
Total CHOL/HDL Ratio: 4.1 RATIO
Triglycerides: 81 mg/dL (ref <150)
VLDL: 16 mg/dL (ref 0–40)

## 2018-08-30 LAB — HEMOGLOBIN A1C
Hgb A1c MFr Bld: 6.1 % — ABNORMAL HIGH (ref 4.8–5.6)
Mean Plasma Glucose: 128.37 mg/dL

## 2018-08-30 NOTE — Progress Notes (Signed)
BP 130/62 (BP Location: Left Arm, Patient Position: Sitting, Cuff Size: Normal)   Pulse (!) 58   Temp (!) 97.5 F (36.4 C)   Ht 5\' 6"  (1.676 m)   Wt (!) 356 lb (161.5 kg)   LMP 05/06/2016   SpO2 99%   BMI 57.46 kg/m    Subjective:    Patient ID: Sandra AugustVeronica A Wofford, female    DOB: 02/14/1971, 48 y.o.   MRN: [id number]  [name] is a 48 y.o. female presenting on 08/30/2018 for New Patient (Initial Visit) and Knee Pain (bilateral knees. pt states painful to walk and  to stand.)   HPI   Pt had PAP done last month and has appt for mammogram next week.    Pt States Bilateral knee pain and carpal tunnel are main issues.  She saw orthopedist in the past for this and had gotten injections in her knees but says the dr she saw moved practices she thinks.   Pt says she tried conservative therapy for her carpal tunnel including ice and splints but without improvement.   She only needs her inhaler if she catches a cold.   She currently smokes 1/2 ppd.   Pt says she doesn't understand why she is overweight because she only eats once/day.   Relevant past medical, surgical, family and social history reviewed and updated as indicated. Interim medical history since our last visit reviewed. Allergies and medications reviewed and updated.   Current Outpatient Medications:  .  Acetaminophen (TYLENOL PO), Take by mouth., Disp: , Rfl:  .  albuterol (PROVENTIL HFA;VENTOLIN HFA) 108 (90 Base) MCG/ACT inhaler, Inhale 2 puffs into the lungs every 6 (six) hours as needed for wheezing or shortness of breath., Disp: , Rfl:     Review of Systems  Constitutional: Negative for appetite change, chills, diaphoresis, fatigue, fever and unexpected weight change.  HENT: Positive for dental problem and ear pain. Negative for congestion, drooling, facial swelling, hearing loss, mouth sores, sneezing, sore throat, trouble swallowing and voice change.   Eyes: Positive for itching and visual disturbance.  Negative for pain, discharge and redness.  Respiratory: Negative for cough, choking, shortness of breath and wheezing.   Cardiovascular: Negative for chest pain, palpitations and leg swelling.  Gastrointestinal: Positive for constipation. Negative for abdominal pain, blood in stool, diarrhea and vomiting.  Endocrine: Positive for polydipsia. Negative for cold intolerance and heat intolerance.  Genitourinary: Negative for decreased urine volume, dysuria and hematuria.  Musculoskeletal: Positive for arthralgias, back pain and gait problem.  Skin: Negative for rash.  Allergic/Immunologic: Negative for environmental allergies.  Neurological: Negative for seizures, syncope, light-headedness and headaches.  Hematological: Negative for adenopathy.  Psychiatric/Behavioral: Positive for agitation and dysphoric mood. Negative for suicidal ideas. The patient is not nervous/anxious.     Per HPI unless specifically indicated above     Objective:    BP 130/62 (BP Location: Left Arm, Patient Position: Sitting, Cuff Size: Normal)   Pulse (!) 58   Temp (!) 97.5 F (36.4 C)   Ht 5\' 6"  (1.676 m)   Wt (!) 356 lb (161.5 kg)   LMP 05/06/2016   SpO2 99%   BMI 57.46 kg/m   Wt Readings from Last 3 Encounters:  08/30/18 (!) 356 lb (161.5 kg)  07/26/18 (!) 385 lb (174.6 kg)  03/16/18 (!) 360 lb (163.3 kg)    Physical Exam Vitals signs reviewed.  Constitutional:      Appearance: She is well-developed.  HENT:  Head: Normocephalic and atraumatic.     Mouth/Throat:     Pharynx: No oropharyngeal exudate.  Eyes:     Conjunctiva/sclera: Conjunctivae normal.     Pupils: Pupils are equal, round, and reactive to light.  Neck:     Musculoskeletal: Neck supple.     Thyroid: No thyromegaly.  Cardiovascular:     Rate and Rhythm: Normal rate and regular rhythm.  Pulmonary:     Effort: Pulmonary effort is normal.     Breath sounds: Normal breath sounds.  Abdominal:     General: Bowel sounds are normal.      Palpations: Abdomen is soft. There is no mass.     Tenderness: There is no abdominal tenderness.  Musculoskeletal:     Right wrist: She exhibits tenderness. She exhibits normal range of motion.     Left wrist: She exhibits tenderness. She exhibits normal range of motion.     Right knee: She exhibits normal range of motion. Tenderness found.     Left knee: She exhibits normal range of motion. Tenderness found.     Comments: Crepitus B knees.  B wrist exam underwhelming.   Lymphadenopathy:     Cervical: No cervical adenopathy.  Skin:    General: Skin is warm and dry.  Neurological:     Mental Status: She is alert and oriented to person, place, and time.     Gait: Gait normal.  Psychiatric:        Behavior: Behavior normal.         Assessment & Plan:    Encounter Diagnoses  Name Primary?  . Encounter to establish care Yes  . Osteoarthritis of both knees, unspecified osteoarthritis type   . Chronic pain of both knees   . Tobacco use disorder   . Morbid obesity (HCC)   . BMI 50.0-59.9, adult (HCC)   . Pain in both wrists   . Screening for diabetes mellitus   . Screening cholesterol level     -Refer to dietician to get help with weight.  Counseled to eat more frequently than once/day.  Recommended she eat 3 or 4 times daily but not not large amount at any given feeding.  Recommended to increase activity/exercise.   -refer to orthopedist in [place] for evaluation and treatment of the knees and wrists -Will get xrays of the knees so she can get approved for the [facility] charity care.  She is given application -discussed with pt that weight is an issue with her knees and she states she is aware. -will get baseline labs -pt to follow up 1 month.  RTO sooner prn

## 2018-08-30 NOTE — Patient Instructions (Signed)
Budget-Friendly Healthy Eating There are many ways to save money at the grocery store and continue to eat healthy. You can be successful if you:  Plan meals according to your budget.  Make a grocery list and only purchase food according to your grocery list.  Prepare food yourself. What are tips for following this plan?  Reading food labels  Compare food labels between brand name foods and the store brand. Often the nutritional value is the same, but the store brand is lower cost.  Look for products that do not have added sugar, fat, or salt (sodium). These often cost the same but are healthier for you. Products may be labeled as: ? Sugar-free. ? Nonfat. ? Low-fat. ? Sodium-free. ? Low-sodium.  Look for lean ground beef labeled as at least 92% lean and 8% fat. Shopping  Buy only the items on your grocery list and go only to the areas of the store that have the items on your list.  Use coupons only for foods and brands you normally buy. Avoid buying items you wouldn't normally buy simply because they are on sale.  Check online and in newspapers for weekly deals.  Buy healthy items from the bulk bins when available, such as herbs, spices, flour, pasta, nuts, and dried fruit.  Buy fruits and vegetables that are in season. Prices are usually lower on in-season produce.  Look at the unit price on the price tag. Use it to compare different brands and sizes to find out which item is the best deal.  Choose healthy items that are often low-cost, such as carrots, potatoes, apples, bananas, and oranges. Dried or canned beans are a low-cost protein source.  Buy in bulk and freeze extra food. Items you can buy in bulk include meats, fish, poultry, frozen fruits, and frozen vegetables.  Avoid buying "ready-to-eat" foods, such as pre-cut fruits and vegetables and pre-made salads.  If possible, shop around to discover where you can find the best prices. Consider other retailers such as  dollar stores, larger [employer], local fruit and vegetable stands, and farmers markets.  Do not shop when you are hungry. If you shop while hungry, it may be hard to stick to your list and budget.  Resist impulse buying. Use your grocery list as your official plan for the week.  Buy a variety of vegetables and fruits by purchasing fresh, frozen, and canned items.  Look at the top and bottom shelves for deals. Foods at eye level (eye level of an adult or child) are usually more expensive.  Be efficient with your time when shopping. The more time you spend at the store, the more money you are likely to spend.  To save money when choosing more expensive foods like meats and dairy: ? Choose cheaper cuts of meat, such as bone-in chicken thighs and drumsticks instead of skinless and boneless chicken. When you are ready to prepare the chicken, you can remove the skin yourself to make it healthier. ? Choose lean meats like chicken or [place] instead of beef. ? Choose canned seafood, such as tuna, salmon, or sardines. ? Buy eggs as a low-cost source of protein. ? Buy dried beans and peas, such as lentils, split peas, or kidney beans instead of meats. Dried beans and peas are a good alternative source of protein. ? Buy the larger tubs of yogurt instead of individual-sized containers.  Choose water instead of sodas and other sweetened beverages.  Avoid buying chips, cookies, and other "junk food." These  a good alternative source of protein.  ? Buy the larger tubs of yogurt instead of individual-sized containers.   Choose water instead of sodas and other sweetened beverages.   Avoid buying chips, cookies, and other "junk food." These items are usually expensive and not healthy.  Cooking   Make extra food and freeze the extras in meal-sized containers or in individual portions for fast meals and snacks.   Pre-cook on days when you have extra time to prepare meals in advance. You can keep these meals in the fridge or freezer and reheat for a quick meal.   When you come home from the grocery store, wash, peel, and cut fruits and vegetables so they are ready to use and eat. This will help reduce food waste.  Meal planning   Do  not eat out or get fast food. Prepare food at home.   Make a grocery list and make sure to bring it with you to the store. If you have a smart phone, you could use your phone to create your shopping list.   Plan meals and snacks according to a grocery list and budget you create.   Use leftovers in your meal plan for the week.   Look for recipes where you can cook once and make enough food for two meals.   Include budget-friendly meals like stews, casseroles, and stir-fry dishes.   Try some meatless meals or try "no cook" meals like salads.   Make sure that half your plate is filled with fruits or vegetables. Choose from fresh, frozen, or canned fruits and vegetables. If eating canned, remember to rinse them before eating. This will remove any excess salt added for packaging.  Summary   Eating healthy on a budget is possible if you plan your meals according to your budget, purchase according to your budget and grocery list, and prepare food yourself.   Tips for buying more food on a limited budget include buying generic brands, using coupons only for foods you normally buy, and buying healthy items from the bulk bins when available.   Tips for buying cheaper food to replace expensive food include choosing cheaper, lean cuts of meat, and buying dried beans and peas.  This information is not intended to replace advice given to you by your health care provider. Make sure you discuss any questions you have with your health care provider.  Document Released: 04/06/2014 Document Revised: 08/04/2017 Document Reviewed: 08/04/2017  Elsevier Interactive Patient Education  2019 [employer]

## 2018-09-06 ENCOUNTER — Ambulatory Visit
Admission: RE | Admit: 2018-09-06 | Discharge: 2018-09-06 | Disposition: A | Discharge status: Home / Self Care | Payer: No Typology Code available for payment source | Source: Ambulatory Visit | Attending: Obstetrics and Gynecology | Admitting: Obstetrics and Gynecology

## 2018-09-06 DIAGNOSIS — Z1231 Encounter for screening mammogram for malignant neoplasm of breast: Secondary | ICD-10-CM

## 2018-09-12 ENCOUNTER — Encounter (HOSPITAL_COMMUNITY): Payer: Self-pay | Admitting: *Deleted

## 2018-09-12 NOTE — Progress Notes (Signed)
Letter mailed to patient with negative pap smear results. HPV was negative. Next pap smear due in five years. 

## 2018-09-29 ENCOUNTER — Ambulatory Visit: Payer: Self-pay | Admitting: Physician Assistant

## 2018-10-06 ENCOUNTER — Ambulatory Visit: Payer: Self-pay | Admitting: Physician Assistant

## 2018-10-06 ENCOUNTER — Encounter: Payer: Self-pay | Admitting: Physician Assistant

## 2018-10-06 VITALS — BP 120/68 | HR 60 | Temp 98.0°F | Ht 66.0 in | Wt 351.5 lb

## 2018-10-06 DIAGNOSIS — E785 Hyperlipidemia, unspecified: Secondary | ICD-10-CM

## 2018-10-06 DIAGNOSIS — G5603 Carpal tunnel syndrome, bilateral upper limbs: Secondary | ICD-10-CM

## 2018-10-06 DIAGNOSIS — M170 Bilateral primary osteoarthritis of knee: Secondary | ICD-10-CM

## 2018-10-06 DIAGNOSIS — M25561 Pain in right knee: Secondary | ICD-10-CM

## 2018-10-06 DIAGNOSIS — G8929 Other chronic pain: Secondary | ICD-10-CM

## 2018-10-06 DIAGNOSIS — M25532 Pain in left wrist: Secondary | ICD-10-CM

## 2018-10-06 DIAGNOSIS — J449 Chronic obstructive pulmonary disease, unspecified: Secondary | ICD-10-CM

## 2018-10-06 DIAGNOSIS — R7303 Prediabetes: Secondary | ICD-10-CM

## 2018-10-06 DIAGNOSIS — Z6841 Body Mass Index (BMI) 40.0 and over, adult: Secondary | ICD-10-CM

## 2018-10-06 DIAGNOSIS — M25531 Pain in right wrist: Secondary | ICD-10-CM

## 2018-10-06 DIAGNOSIS — M25562 Pain in left knee: Secondary | ICD-10-CM

## 2018-10-06 DIAGNOSIS — F17200 Nicotine dependence, unspecified, uncomplicated: Secondary | ICD-10-CM

## 2018-10-06 DIAGNOSIS — J00 Acute nasopharyngitis [common cold]: Secondary | ICD-10-CM

## 2018-10-06 NOTE — Progress Notes (Signed)
BP 120/68 (BP Location: Left Arm, Patient Position: Sitting, Cuff Size: Large)   Pulse 60   Temp 98 F (36.7 C)   Ht 5\' 6"  (1.676 m)   Wt (!) 351 lb 8 oz (159.4 kg)   LMP 05/06/2016   SpO2 96%   BMI 56.73 kg/m    Subjective:    [name], female    DOB: Jan 07, 1971, 48 y.o.   MRN: [id number]  [name] is a 48 y.o. female presenting on 10/06/2018 for Follow-up and Cough (chills, sweats, fatigue, low appetite, sub fever, HA, R ear pain, sneezing, wheezing, chest congestion. pt states sx started 2 days ago. pt has taken theraflu, cough syrup and advil which help)   HPI   Chief Complaint  Patient presents with  . Follow-up  . Cough    chills, sweats, fatigue, low appetite, sub fever, HA, R ear pain, sneezing, wheezing, chest congestion. pt states sx started 2 days ago. pt has taken theraflu, cough syrup and advil which help    Pt has appointment with nutitionist next week for her morbid obesity  Pt also c/o carpal tunnel Bilaterally for 2 or 3 years.  She has seen dr Mickie Hillier for this in the past- not seen since last year some time.  She also saw this orthopedist for her knee pain and has gotten injections for them.   Relevant past medical, surgical, family and social history reviewed and updated as indicated. Interim medical history since our last visit reviewed. Allergies and medications reviewed and updated.   Current Outpatient Medications:  .  Acetaminophen (TYLENOL PO), Take by mouth., Disp: , Rfl:  .  albuterol (PROVENTIL HFA;VENTOLIN HFA) 108 (90 Base) MCG/ACT inhaler, Inhale 2 puffs into the lungs every 6 (six) hours as needed for wheezing or shortness of breath., Disp: , Rfl:     Review of Systems  Constitutional: Negative for appetite change, chills, diaphoresis, fatigue, fever and unexpected weight change.  HENT: Positive for congestion, dental problem and ear pain. Negative for drooling, facial swelling, hearing loss, mouth  sores, sneezing, sore throat, trouble swallowing and voice change.   Eyes: Positive for visual disturbance. Negative for pain, discharge, redness and itching.  Respiratory: Positive for cough, choking and wheezing. Negative for shortness of breath.   Cardiovascular: Negative for chest pain, palpitations and leg swelling.  Gastrointestinal: Negative for abdominal pain, blood in stool, constipation, diarrhea and vomiting.  Endocrine: Positive for polydipsia. Negative for cold intolerance and heat intolerance.  Genitourinary: Negative for decreased urine volume, dysuria and hematuria.  Musculoskeletal: Positive for arthralgias, back pain and gait problem.  Skin: Negative for rash.  Allergic/Immunologic: Negative for environmental allergies.  Neurological: Negative for seizures, syncope, light-headedness and headaches.  Hematological: Negative for adenopathy.  Psychiatric/Behavioral: Positive for dysphoric mood. Negative for agitation and suicidal ideas. The patient is not nervous/anxious.     Per HPI unless specifically indicated above     Objective:    BP 120/68 (BP Location: Left Arm, Patient Position: Sitting, Cuff Size: Large)   Pulse 60   Temp 98 F (36.7 C)   Ht 5\' 6"  (1.676 m)   Wt (!) 351 lb 8 oz (159.4 kg)   LMP 05/06/2016   SpO2 96%   BMI 56.73 kg/m   Wt Readings from Last 3 Encounters:  10/06/18 (!) 351 lb 8 oz (159.4 kg)  08/30/18 (!) 356 lb (161.5 kg)  07/26/18 (!) 385 lb (174.6 kg)    Physical Exam  Vitals signs reviewed.  Constitutional:      Appearance: She is well-developed.  HENT:     Head: Normocephalic and atraumatic.     Right Ear: Hearing, tympanic membrane, ear canal and external ear normal.     Left Ear: Hearing, tympanic membrane, ear canal and external ear normal.     Nose: Nose normal.     Mouth/Throat:     Pharynx: Uvula midline. No oropharyngeal exudate.  Neck:     Musculoskeletal: Neck supple.  Cardiovascular:     Rate and Rhythm: Normal rate  and regular rhythm.  Pulmonary:     Effort: Pulmonary effort is normal.     Breath sounds: Normal breath sounds. No wheezing.  Abdominal:     General: Bowel sounds are normal.     Palpations: Abdomen is soft. There is no mass.     Tenderness: There is no abdominal tenderness.  Musculoskeletal:     Right wrist: She exhibits tenderness.     Left wrist: She exhibits tenderness.     Right knee: No tenderness found.     Left knee: No tenderness found.  Lymphadenopathy:     Cervical: No cervical adenopathy.  Skin:    General: Skin is warm and dry.  Neurological:     Mental Status: She is alert and oriented to person, place, and time.  Psychiatric:        Behavior: Behavior normal.     Results for orders placed or performed during the hospital encounter of 08/30/18  Hemoglobin A1c  Result Value Ref Range   Hgb A1c MFr Bld 6.1 (H) 4.8 - 5.6 %   Mean Plasma Glucose 128.37 mg/dL  Comprehensive metabolic panel  Result Value Ref Range   Sodium 137 135 - 145 mmol/L   Potassium 3.9 3.5 - 5.1 mmol/L   Chloride 103 98 - 111 mmol/L   CO2 26 22 - 32 mmol/L   Glucose, Bld 87 70 - 99 mg/dL   BUN 7 6 - 20 mg/dL   Creatinine, Ser 4.48 0.44 - 1.00 mg/dL   Calcium 9.2 8.9 - 18.5 mg/dL   Total Protein 7.7 6.5 - 8.1 g/dL   Albumin 4.0 3.5 - 5.0 g/dL   AST 15 15 - 41 U/L   ALT 13 0 - 44 U/L   Alkaline Phosphatase 83 38 - 126 U/L   Total Bilirubin 0.6 0.3 - 1.2 mg/dL   GFR calc non Af Amer >60 >60 mL/min   GFR calc Af Amer >60 >60 mL/min   Anion gap 8 5 - 15  Lipid panel  Result Value Ref Range   Cholesterol 174 0 - 200 mg/dL   Triglycerides 81 <631 mg/dL   HDL 42 >49 mg/dL   Total CHOL/HDL Ratio 4.1 RATIO   VLDL 16 0 - 40 mg/dL   LDL Cholesterol 702 (H) 0 - 99 mg/dL      Assessment & Plan:    Encounter Diagnoses  Name Primary?  . Tobacco use disorder Yes  . Chronic obstructive pulmonary disease, unspecified COPD type (HCC)   . Morbid obesity (HCC)   . BMI 50.0-59.9, adult (HCC)    . Chronic pain of both knees   . Osteoarthritis of both knees, unspecified osteoarthritis type   . Pain in both wrists   . Prediabetes   . Bilateral carpal tunnel syndrome   . Acute nasopharyngitis   . Hyperlipidemia, unspecified hyperlipidemia type     -reviewed labs with pt -reviewed knee x-rays with pt -  Refer to orthopedics for B knee pain with DJD and carpal tunnel -counseled pt on OTCs prn URI.  Counseled pt to avoid smoking -pt counseled on need for weight loss to help with knee pain as well as prediabetes and lipids -pt to follow up in 6 months.  RTO sooner prn

## 2018-10-06 NOTE — Patient Instructions (Signed)
Prediabetes Prediabetes is the condition of having a blood sugar (blood glucose) level that is higher than it should be, but not high enough for you to be diagnosed with type 2 diabetes. Having prediabetes puts you at risk for developing type 2 diabetes (type 2 diabetes mellitus). Prediabetes may be called impaired glucose tolerance or impaired fasting glucose. Prediabetes usually does not cause symptoms. Your health care provider can diagnose this condition with blood tests. You may be tested for prediabetes if you are overweight and if you have at least one other risk factor for prediabetes. What is blood glucose, and how is it measured? Blood glucose refers to the amount of glucose in your bloodstream. Glucose comes from eating foods that contain sugars and starches (carbohydrates), which the body breaks down into glucose. Your blood glucose level may be measured in mg/dL (milligrams per deciliter) or mmol/L (millimoles per liter). Your blood glucose may be checked with one or more of the following blood tests:  A fasting blood glucose (FBG) test. You will not be allowed to eat (you will fast) for 8 hours or longer before a blood sample is taken. ? A normal range for FBG is 70-100 mg/dl (3.9-5.6 mmol/L).  An A1c (hemoglobin A1c) blood test. This test provides information about blood glucose control over the previous 2?3months.  An oral glucose tolerance test (OGTT). This test measures your blood glucose at two times: ? After fasting. This is your baseline level. ? Two hours after you drink a beverage that contains glucose. You may be diagnosed with prediabetes:  If your FBG is 100?125 mg/dL (5.6-6.9 mmol/L).  If your A1c level is 5.7?6.4%.  If your OGTT result is 140?199 mg/dL (7.8-11 mmol/L). These blood tests may be repeated to confirm your diagnosis. How can this condition affect me? The pancreas produces a hormone (insulin) that helps to move glucose from the bloodstream into cells.  When cells in the body do not respond properly to insulin that the body makes (insulin resistance), excess glucose builds up in the blood instead of going into cells. As a result, high blood glucose (hyperglycemia) can develop, which can cause many complications. Hyperglycemia is a symptom of prediabetes. Having high blood glucose for a long time is dangerous. Too much glucose in your blood can damage your nerves and blood vessels. Long-term damage can lead to complications from diabetes, which may include:  Heart disease.  Stroke.  Blindness.  Kidney disease.  Depression.  Poor circulation in the feet and legs, which could lead to surgical removal (amputation) in severe cases. What can increase my risk? Risk factors for prediabetes include:  Having a family member with type 2 diabetes.  Being overweight or obese.  Being older than age 45.  Being of American Indian, African-American, Hispanic/Latino, or Asian/Pacific Islander descent.  Having an inactive (sedentary) lifestyle.  Having a history of heart disease.  History of gestational diabetes or polycystic ovary syndrome (PCOS), in women.  Having low levels of good cholesterol (HDL-C) or high levels of blood fats (triglycerides).  Having high blood pressure. What actions can I take to prevent diabetes?      Be physically active. ? Do moderate-intensity physical activity for 30 or more minutes on 5 or more days of the week, or as much as told by your health care provider. This could be brisk walking, biking, or water aerobics. ? Ask your health care provider what activities are safe for you. A mix of physical activities may be best, such as   walking, swimming, cycling, and strength training.  Lose weight as told by your health care provider. ? Losing 5-7% of your body weight can reverse insulin resistance. ? Your health care provider can determine how much weight loss is best for you and can help you lose weight  safely.  Follow a healthy meal plan. This includes eating lean proteins, complex carbohydrates, fresh fruits and vegetables, low-fat dairy products, and healthy fats. ? Follow instructions from your health care provider about eating or drinking restrictions. ? Make an appointment to see a diet and nutrition specialist (registered dietitian) to help you create a healthy eating plan that is right for you.  Do not smoke or use any tobacco products, such as cigarettes, chewing tobacco, and e-cigarettes. If you need help quitting, ask your health care provider.  Take over-the-counter and prescription medicines as told by your health care provider. You may be prescribed medicines that help lower the risk of type 2 diabetes.  Keep all follow-up visits as told by your health care provider. This is important. Summary  Prediabetes is the condition of having a blood sugar (blood glucose) level that is higher than it should be, but not high enough for you to be diagnosed with type 2 diabetes.  Having prediabetes puts you at risk for developing type 2 diabetes (type 2 diabetes mellitus).  To help prevent type 2 diabetes, make lifestyle changes such as being physically active and eating a healthy diet. Lose weight as told by your health care provider. This information is not intended to replace advice given to you by your health care provider. Make sure you discuss any questions you have with your health care provider. Document Released: 11/25/2015 Document Revised: 03/23/2017 Document Reviewed: 09/24/2015 Elsevier Interactive Patient Education  2019 Elsevier Inc.  ---------------------------------------------------------------------------------------------    Cholesterol Cholesterol is a white, waxy, fat-like substance that is needed by the human body in small amounts. The liver makes all the cholesterol we need. Cholesterol is carried from the liver by the blood through the blood vessels. Deposits of  cholesterol (plaques) may build up on blood vessel (artery) walls. Plaques make the arteries narrower and stiffer. Cholesterol plaques increase the risk for heart attack and stroke. You cannot feel your cholesterol level even if it is very high. The only way to know that it is high is to have a blood test. Once you know your cholesterol levels, you should keep a record of the test results. Work with your health care provider to keep your levels in the desired range. What do the results mean?  Total cholesterol is a rough measure of all the cholesterol in your blood.  LDL (low-density lipoprotein) is the "bad" cholesterol. This is the type that causes plaque to build up on the artery walls. You want this level to be low.  HDL (high-density lipoprotein) is the "good" cholesterol because it cleans the arteries and carries the LDL away. You want this level to be high.  Triglycerides are fat that the body can either burn for energy or store. High levels are closely linked to heart disease. What are the desired levels of cholesterol?  Total cholesterol below 200.  LDL below 100 for people who are at risk, below 70 for people at very high risk.  HDL above 40 is good. A level of 60 or higher is considered to be protective against heart disease.  Triglycerides below 150. How can I lower my cholesterol? Diet Follow your diet program as told by your health  care provider.  Choose fish or white meat chicken and [place], roasted or baked. Limit fatty cuts of red meat, fried foods, and processed meats, such as sausage and lunch meats.  Eat lots of fresh fruits and vegetables.  Choose whole grains, beans, pasta, potatoes, and cereals.  Choose olive oil, corn oil, or canola oil, and use only small amounts.  Avoid butter, mayonnaise, shortening, or palm kernel oils.  Avoid foods with trans fats.  Drink skim or nonfat milk and eat low-fat or nonfat yogurt and cheeses. Avoid whole milk, cream, ice  cream, egg yolks, and full-fat cheeses.  Healthier desserts include angel food cake, ginger snaps, animal crackers, hard candy, popsicles, and low-fat or nonfat frozen yogurt. Avoid pastries, cakes, pies, and cookies.  Exercise  Follow your exercise program as told by your health care provider. A regular program: ? Helps to decrease LDL and raise HDL. ? Helps with weight control.  Do things that increase your activity level, such as gardening, walking, and taking the stairs.  Ask your health care provider about ways that you can be more active in your daily life. Medicine  Take over-the-counter and prescription medicines only as told by your health care provider. ? Medicine may be prescribed by your health care provider to help lower cholesterol and decrease the risk for heart disease. This is usually done if diet and exercise have failed to bring down cholesterol levels. ? If you have several risk factors, you may need medicine even if your levels are normal. This information is not intended to replace advice given to you by your health care provider. Make sure you discuss any questions you have with your health care provider. Document Released: 04/28/2001 Document Revised: 02/29/2016 Document Reviewed: 02/01/2016 Elsevier Interactive Patient Education  [employer].

## 2018-10-10 ENCOUNTER — Encounter: Payer: Self-pay | Admitting: Physician Assistant

## 2018-10-11 ENCOUNTER — Ambulatory Visit: Payer: Self-pay | Admitting: Nutrition

## 2018-10-18 ENCOUNTER — Encounter: Payer: Self-pay | Admitting: Sports Medicine

## 2018-10-18 ENCOUNTER — Ambulatory Visit (INDEPENDENT_AMBULATORY_CARE_PROVIDER_SITE_OTHER): Payer: Self-pay | Admitting: Sports Medicine

## 2018-10-18 VITALS — BP 129/54 | Ht 66.0 in | Wt 370.0 lb

## 2018-10-18 DIAGNOSIS — M170 Bilateral primary osteoarthritis of knee: Secondary | ICD-10-CM

## 2018-10-18 MED ORDER — METHYLPREDNISOLONE ACETATE 40 MG/ML IJ SUSP
40.0000 mg | Freq: Once | INTRAMUSCULAR | Status: AC
  Administered 2018-10-18: 40 mg via INTRA_ARTICULAR

## 2018-10-18 NOTE — Progress Notes (Signed)
HPI  CC: Bilateral knee pain  Sandra Orr is a 48 year old female presents for bilateral knee pain.  She was last seen in this clinic on 12/22/2017.  At that time she had bilateral knee injections.  She states these injections lasted around 3 to 4 months.  She states she did have some pain locally when she had the injections.  She states this gave her a fear of returning for more injections.  She states she is continued to have pain with any walking standing or prolonged sitting.  She states that resting does help somewhat.  She is tried Tylenol, Advil, Aleve, naproxen, and ibuprofen 800 mg without any relief.  She took this medication at separate times.  She has never been to formal physical therapy.  She denies any numbness and tingling in her legs.  She denies any mechanical locking of her knees.  She denies any giving out of her legs.  See HPI and/or previous note for associated ROS.  Objective: BP (!) 129/54   Ht 5\' 6"  (1.676 m)   Wt (!) 370 lb (167.8 kg)   LMP 05/06/2016   BMI 59.72 kg/m  Gen: Right-Hand Dominant. NAD, well groomed, a/o x3, normal affect.  CV: Well-perfused. Warm.  Resp: Non-labored.  Neuro: Sensation intact throughout. No gross coordination deficits.  Gait: Nonpathologic posture, unremarkable stride without signs of limp or balance issues.  Bilateral knee exam: No erythema, warmth, swelling noted.  Tenderness palpation of the lateral and medial joint line of bilateral knees.  Range of motion limited with a round of 10 degree lag in extension.  Range of motion limited to around 90 degrees in flexion.  Strength out of 5 throughout testing.  Negative ligamentous testing.  Assessment and plan: Primary osteoarthritis of bilateral knees.  INJECTION: Patient was given informed consent, signed copy in the chart. Appropriate time out was taken. Area prepped and draped in usual sterile fashion.  I injected 3 cc of 1% lidocaine into the superior lateral edge of the left knee.   2 cc of methylprednisolone 40 mg/ml plus  4 cc of 1% lidocaine without epinephrine was injected into the left knee using a(n) superior lateral approach under ultrasound guidance. The patient tolerated the procedure well. There were no complications. Post procedure instructions were given.  INJECTION: Patient was given informed consent, signed copy in the chart. Appropriate time out was taken. Area prepped and draped in usual sterile fashion.  I injected 3 cc of 1% lidocaine into the superior lateral edge of the right knee.  2 cc of methylprednisolone 40 mg/ml plus  4 cc of 1% lidocaine without epinephrine was injected into the right knee using a(n) superior lateral approach under ultrasound guidance. The patient tolerated the procedure well. There were no complications. Post procedure instructions were given.  We discussed treatment options moving forward.  Given that she had pain with her previous injections, I did numb the track with lidocaine today.  She seemed to tolerate this well and had no complaints of pain.  She did have some relief from pain following these injections.  I did advise her we can get the shots every 3 to 4 months.  I did recommend that she start home exercise for weight loss.  Ultimately, she may need a knee replacement.  She will need to get her BMI below 40 before this is a consideration.  We will follow her up as needed.  Alric Quan, MD [facility] Sports Medicine Fellow 10/18/2018 5:13 PM  Patient  seen and evaluated with the sports medicine fellow.  I agree with the above plan of care.  Patient's knees were injected today with ultrasound guidance.  She had immediate relief of symptoms prior to leaving the office.  Definitive treatment is a total knee arthroplasty but her BMI precludes her from having surgery.  Follow-up as needed.

## 2018-10-19 ENCOUNTER — Encounter: Payer: Self-pay | Admitting: Sports Medicine

## 2018-11-01 ENCOUNTER — Telehealth: Payer: Self-pay | Admitting: Nutrition

## 2018-11-01 ENCOUNTER — Encounter: Payer: Self-pay | Admitting: Nutrition

## 2018-11-01 ENCOUNTER — Other Ambulatory Visit: Payer: Self-pay

## 2018-11-01 ENCOUNTER — Encounter: Payer: No Typology Code available for payment source | Attending: Physician Assistant | Admitting: Nutrition

## 2018-11-01 DIAGNOSIS — R739 Hyperglycemia, unspecified: Secondary | ICD-10-CM | POA: Insufficient documentation

## 2018-11-01 NOTE — Telephone Encounter (Signed)
TC to pt to let her know that the [facility] reports they have some canned good they could provide to her. She noted she has an appt with a case manager about housing at 3 pm today. She notes she will stop by the [employer] before she meets with case worker.     She was not able to make it to the soup kitchen today. Handout for food pantries given. Mailed information on housing and local resources in [place].  She verbalized understanding.

## 2018-11-01 NOTE — Patient Instructions (Signed)
Goals 1. Eat three meals per day when able 2. Increase fruit, vegetables and baked and broiled meats 3. Cut out sodas and drink only water 4. Call the numbers given for housing, food and living assistance. 5. Walk daily if able. Cut out eating fast foods.

## 2018-11-01 NOTE — Progress Notes (Signed)
Medical Nutrition Therapy:  Appt start time: 0930 end time:  1030.   Assessment:  Primary concerns today: .Obesity. Currently homeless and living out of her daughter,s car. Working on getting disability. Has no income. Limited food available in her car. Has water and some nabs.  Worked with [facility] to get her a PCP and recently was seen at the [facility]. Hasn't looked into the homeless shelter in [place] because she didn't have anywhere to go during the day with no or limited transportation. Wants to lose weight. Limited mobility due to back, knee and hand pain. Got an injection recently and that helped a lot with her pain. Willing to walk some when feeling better to lose weight.  She notes she is depressed due to her current living situation. Advised to contact PCP.  Wants to lose weight. Thought she should be losing weight since she doesn't eat much all day.    Prediabetic with A1C 6.1%. LDL higher at 116 mg/dl. Wt Readings from Last 3 Encounters:  11/01/18 (!) 354 lb (160.6 kg)  10/18/18 (!) 370 lb (167.8 kg)  10/06/18 (!) 351 lb 8 oz (159.4 kg)   Ht Readings from Last 3 Encounters:  11/01/18 5\' 6"  (1.676 m)  10/18/18 5\' 6"  (1.676 m)  10/06/18 5\' 6"  (1.676 m)   Body mass index is 57.14 kg/m. @BMIFA @ Facility age limit for growth percentiles is 20 years. Facility age limit for growth percentiles is 20 years.  CMP Latest Ref Rng & Units 08/30/2018 12/24/2017 12/23/2017  Glucose 70 - 99 mg/dL 87 300(F) 110(Y)  BUN 6 - 20 mg/dL 7 16 10   Creatinine 0.44 - 1.00 mg/dL 1.11 7.35 6.70  Sodium 135 - 145 mmol/L 137 138 141  Potassium 3.5 - 5.1 mmol/L 3.9 3.6 3.8  Chloride 98 - 111 mmol/L 103 103 104  CO2 22 - 32 mmol/L 26 29 26   Calcium 8.9 - 10.3 mg/dL 9.2 9.3 14.1  Total Protein 6.5 - 8.1 g/dL 7.7 7.9 0.3(U)  Total Bilirubin 0.3 - 1.2 mg/dL 0.6 0.5 0.7  Alkaline Phos 38 - 126 U/L 83 77 84  AST 15 - 41 U/L 15 25 18   ALT 0 - 44 U/L 13 35 16     Preferred Learning Style:      No preference indicated   Learning Readiness:   Ready  Change in progress   MEDICATIONS:    DIETARY INTAKE:  24-hr recall:  B ( AM):  Egg biscuit or eggs and toast or nabs Snk ( AM):  L ( PM):  Nabs or sandwich if available. Diet soda, water Snk ( PM):  D ( PM): whatever she has available.  Snk ( PM): water Beverages: water, diet sodas Typically eats packaged foods or fast foods due to lack of refrigeration.   Usual physical activity: ADL  Estimated energy needs: 1500  calories 170 g carbohydrates 112 g protein 42 g fat  Progress Towards Goal(s):  In progress.   Nutritional Diagnosis:  NI-1.7 Predicted excessive energy intake As related to Obesity and diet recall .  As evidenced by BMI 57.    Intervention:  Nutrition and Diabetes education provided on My Plate, CHO counting, meal planning, portion sizes, timing of meals. Reviewed local shelters, food pantries for access to food and shelter. Case worker will be calling her.  She has been in contact with [facility] for her PCP needs. Will contact Teryl Lucy at [facility]. Encouraged to focus on more fresh fruits and vegetables  when available and increase walking.  Goals 1. Eat three meals per day when able 2. Increase fruit, vegetables and baked and broiled meats 3. Cut out sodas and drink only water 4. Call the numbers given for housing, food and living assistance. 5. Walk daily if able. Cut out eating fast foods.   Teaching Method Utilized:  Visual Auditory Hands on  Handouts given during visit include:  The Plate Method   Meal Plan Card  Housing numbers  Contact information for local food pantries and local shelter.  Barriers to learning/adherence to lifestyle change: homelessness  Demonstrated degree of understanding via:  Teach Back   Monitoring/Evaluation:  Dietary intake, exercise, and body weight in 1 month(s). She notes she is depressed due to her current living situation.  Advised to contact PCP.

## 2018-11-15 ENCOUNTER — Ambulatory Visit: Payer: No Typology Code available for payment source | Admitting: Family Medicine

## 2018-11-22 ENCOUNTER — Encounter: Payer: Self-pay | Admitting: Family Medicine

## 2018-11-22 ENCOUNTER — Ambulatory Visit (INDEPENDENT_AMBULATORY_CARE_PROVIDER_SITE_OTHER): Payer: Self-pay | Admitting: Family Medicine

## 2018-11-22 ENCOUNTER — Other Ambulatory Visit: Payer: Self-pay

## 2018-11-22 VITALS — BP 119/75 | HR 70 | Temp 98.3°F | Ht 66.0 in | Wt 380.0 lb

## 2018-11-22 DIAGNOSIS — G5603 Carpal tunnel syndrome, bilateral upper limbs: Secondary | ICD-10-CM

## 2018-11-22 DIAGNOSIS — M170 Bilateral primary osteoarthritis of knee: Secondary | ICD-10-CM

## 2018-11-22 MED ORDER — DICLOFENAC SODIUM 75 MG PO TBEC: 75.0000 mg | DELAYED_RELEASE_TABLET | Freq: Two times a day (BID) | ORAL | 1 refills | Status: DC

## 2018-11-22 NOTE — Progress Notes (Signed)
PCP: Jacquelin Hawking, PA-C  Subjective:   HPI: Patient is a 48 y.o. female here for bilateral knee pain, hand pain.  3/3: Sandra Orr is a 48 year old female presents for bilateral knee pain.  She was last seen in this clinic on 12/22/2017.  At that time she had bilateral knee injections.  She states these injections lasted around 3 to 4 months.  She states she did have some pain locally when she had the injections.  She states this gave her a fear of returning for more injections.  She states she is continued to have pain with any walking standing or prolonged sitting.  She states that resting does help somewhat.  She is tried Tylenol, Advil, Aleve, naproxen, and ibuprofen 800 mg without any relief.  She took this medication at separate times.  She has never been to formal physical therapy.  She denies any numbness and tingling in her legs.  She denies any mechanical locking of her knees.  She denies any giving out of her legs.  4/7: Patient reports the injections for her knees only helped about 2 weeks and helped completely during that time. Pain in left knee is 8/10, righ 6/10 and both sharp. Worse with walking. Tried tylenol, advil, aleve without benefit. No skin changes. Also with several years of pain in both volar wrists, 9/10 on left, 8/10 at right both radiating into 1st-3rd digits. With associated numbness in these digits. Pain is achy. Wore braces at one point for 6 months but doesn't have these now. No history of injections, nerve conduction studies.  Past Medical History:  Diagnosis Date  . Arthritis   . Back pain   . Carpal tunnel syndrome   . Depression   . DM type 2 (diabetes mellitus, type 2) (HCC) 02/04/2015   A1c 6.5 (01/11/15)   . GERD (gastroesophageal reflux disease)   . Obesity     Current Outpatient Medications on File Prior to Visit  Medication Sig Dispense Refill  . Acetaminophen (TYLENOL PO) Take by mouth.    Marland Kitchen albuterol (PROVENTIL HFA;VENTOLIN HFA) 108 (90  Base) MCG/ACT inhaler Inhale 2 puffs into the lungs every 6 (six) hours as needed for wheezing or shortness of breath.     No current facility-administered medications on file prior to visit.     Past Surgical History:  Procedure Laterality Date  . CHOLECYSTECTOMY    . TUBAL LIGATION      Allergies  Allergen Reactions  . Penicillins Hives    Has patient had a PCN reaction causing immediate rash, facial/tongue/throat swelling, SOB or lightheadedness with hypotension: No Has patient had a PCN reaction causing severe rash involving mucus membranes or skin necrosis: No Has patient had a PCN reaction that required hospitalization No Has patient had a PCN reaction occurring within the last 10 years: No If all of the above answers are "NO", then may proceed with Cephalosporin use.     Social History   Socioeconomic History  . Marital status: Single    Spouse name: Not on file  . Number of children: Not on file  . Years of education: Not on file  . Highest education level: Not on file  Occupational History  . Not on file  Social Needs  . Financial resource strain: Not on file  . Food insecurity:    Worry: Not on file    Inability: Not on file  . Transportation needs:    Medical: Not on file    Non-medical: Not on file  Tobacco Use  . Smoking status: Current Every Day Smoker    Packs/day: 0.50    Years: 31.00    Pack years: 15.50    Types: Cigarettes  . Smokeless tobacco: Never Used  Substance and Sexual Activity  . Alcohol use: No    Alcohol/week: 0.0 standard drinks  . Drug use: Yes    Frequency: 1.0 times per week    Types: Marijuana, Cocaine    Comment: MJ twice a month. no coke in since 2010  . Sexual activity: Not Currently    Birth control/protection: Surgical  Lifestyle  . Physical activity:    Days per week: Not on file    Minutes per session: Not on file  . Stress: Not on file  Relationships  . Social connections:    Talks on phone: Not on file    Gets  together: Not on file    Attends religious service: Not on file    Active member of club or organization: Not on file    Attends meetings of clubs or organizations: Not on file    Relationship status: Not on file  . Intimate partner violence:    Fear of current or ex partner: Not on file    Emotionally abused: Not on file    Physically abused: Not on file    Forced sexual activity: Not on file  Other Topics Concern  . Not on file  Social History Narrative   Single, Unemployed, attended some high school. She stays with different friends, no stable home. Public transportation.    Family History  Problem Relation Age of Onset  . Depression Mother        bipolar/schizo  . Heart disease Mother   . Diabetes Mother   . Asthma Father   . Hyperlipidemia Father   . Depression Maternal Grandmother   . Heart disease Maternal Grandmother   . Diabetes Maternal Grandmother   . Hypertension Maternal Grandfather   . Breast cancer Paternal Aunt     BP 119/75   Pulse 70   Temp 98.3 F (36.8 C) (Oral)   Ht 5\' 6"  (1.676 m)   Wt (!) 380 lb (172.4 kg)   LMP 05/06/2016   BMI 61.33 kg/m   Review of Systems: See HPI above.     Objective:  Physical Exam:  Gen: NAD, comfortable in exam room  Right knee: No gross deformity, ecchymoses, effusion. TTP medial joint line.  Minimal tenderness patellar tendon insertion.  No other tenderness. FROM with 5/5 strength flexion and extension. Negative ant/post drawers. Negative valgus/varus testing. Negative lachmans. Negative mcmurrays, apleys, patellar apprehension. NV intact distally.  Left knee: No gross deformity, ecchymoses, effusion. TTP medial joint line.  Minimal tenderness patellar tendon insertion.  No other tenderness. FROM with 5/5 strength flexion and extension. Negative ant/post drawers. Negative valgus/varus testing. Negative lachmans. Negative mcmurrays, apleys, patellar apprehension. NV intact distally.  Right  hand/wrist: No deformity, swelling, bruising, atrophy. FROM with 5/5 strength abduction and extension but 4/5 thumb opposition. No tenderness to palpation. Positive tinels and phalens Sensation diminished 1st and 2nd digits.  Left hand/wrist: No deformity, swelling, bruising, atrophy. FROM with 5/5 strength abduction and extension but 4/5 thumb opposition. No tenderness to palpation. Positive tinels and phalens Sensation diminished 1st and 2nd digits.   Assessment & Plan:  1. Bilateral knee pain - independently reviewed radiographs and noted severe medial arthropathy.  Injections helped but only for 2 weeks.  Will try diclofenac twice a day.  Tylenol, topical  medications, supplements reviewed.  Shown home exercises to do daily.  Heat or ice.  Has cone coverage but cannot do viscosupplementation with this.  Also cannot do arthroplasty with current BMI.  Consider physical therapy.  F/u in 1 month.   2. Bilateral carpal tunnel syndrome - present for several years.  Will order NCV/EMGs.  Diclofenac, wrist braces.  Consider steroid injection, hand surgeon referral, oral steroids depending on results.

## 2018-11-22 NOTE — Addendum Note (Signed)
Addended by: Kathi Simpers F on: 11/22/2018 02:36 PM   Modules accepted: Orders

## 2018-11-22 NOTE — Patient Instructions (Signed)
You have carpal tunnel syndrome. Wear the wrist braces at nighttime and as often as possible during the day Diclofenac 75mg  twice a day with food for pain and inflammation. A prednisone dose pack is a consideration but would like to avoid this with the virus circulating. Corticosteroid injection is a consideration to help with pain and inflammation. We will see if we can get the nerve conduction studies to assess the severity of this on both sides. Follow up with me in 1 month.  Your knee pain is due to arthritis. These are the different medications you can take for this: Tylenol 500mg  1-2 tabs three times a day for pain. Capsaicin, aspercreme, or biofreeze topically up to four times a day may also help with pain. Some supplements that may help for arthritis: Boswellia extract, curcumin, pycnogenol Diclofenac twice a day with food. Cortisone injections are an option every 3 months. If cortisone injections do not help, there are different types of shots that may help but they take longer to take effect. It's important that you continue to stay active - you cannot get these with the cone coverage unfortunately. Straight leg raises, knee extensions 3 sets of 10 once a day (add ankle weight if these become too easy). Consider physical therapy to strengthen muscles around the joint that hurts to take pressure off of the joint itself. Shoe inserts with good arch support may be helpful. Heat or ice 15 minutes at a time 3-4 times a day as needed to help with pain. Water aerobics and cycling with low resistance are the best two types of exercise for arthritis though any exercise is ok as long as it doesn't worsen the pain. Follow up with me in 1 month.

## 2018-12-05 ENCOUNTER — Ambulatory Visit: Payer: No Typology Code available for payment source | Admitting: Nutrition

## 2018-12-07 ENCOUNTER — Other Ambulatory Visit: Payer: Self-pay

## 2018-12-07 ENCOUNTER — Encounter: Payer: No Typology Code available for payment source | Attending: Physician Assistant | Admitting: Nutrition

## 2018-12-07 NOTE — Progress Notes (Signed)
Medical Nutrition Therapy:  Appt start time: 1330end time:  1400.   Assessment:  Primary concerns today: .Obesity. Currently homeless and living out of her daughter's car.  She hasn't been able to do anything. But did get the housing application done. Was prescribed some medication but can't pick it up due to money for medication. Gained 30 lbs in the last month. Working on getting disability. Has no income. Limited food available in her car. Has water and some nabs.  Worked with [facility] to get her a PCP and recently was seen at the [facility]. She notes she is just trying to 'make it.' Will make referral to [facility] to see they can assist with getting her housing, foods and medications.   Wt Readings from Last 3 Encounters:  11/22/18 (!) 380 lb (172.4 kg)  11/01/18 (!) 354 lb (160.6 kg)  10/18/18 (!) 370 lb (167.8 kg)   Ht Readings from Last 3 Encounters:  11/22/18 5\' 6"  (1.676 m)  11/01/18 5\' 6"  (1.676 m)  10/18/18 5\' 6"  (1.676 m)   There is no height or weight on file to calculate BMI. @BMIFA @ Facility age limit for growth percentiles is 20 years. Facility age limit for growth percentiles is 20 years.  CMP Latest Ref Rng & Units 08/30/2018 12/24/2017 12/23/2017  Glucose 70 - 99 mg/dL 87 106(Y) 694(W)  BUN 6 - 20 mg/dL 7 16 10   Creatinine 0.44 - 1.00 mg/dL 5.46 2.70 3.50  Sodium 135 - 145 mmol/L 137 138 141  Potassium 3.5 - 5.1 mmol/L 3.9 3.6 3.8  Chloride 98 - 111 mmol/L 103 103 104  CO2 22 - 32 mmol/L 26 29 26   Calcium 8.9 - 10.3 mg/dL 9.2 9.3 09.3  Total Protein 6.5 - 8.1 g/dL 7.7 7.9 8.1(W)  Total Bilirubin 0.3 - 1.2 mg/dL 0.6 0.5 0.7  Alkaline Phos 38 - 126 U/L 83 77 84  AST 15 - 41 U/L 15 25 18   ALT 0 - 44 U/L 13 35 16     Preferred Learning Style:     No preference indicated   Learning Readiness:   Ready  Change in progress   MEDICATIONS:    DIETARY INTAKE:  24-hr recall:  B ( AM):  Egg biscuit or eggs and toast or nabs Snk (  AM):  L ( PM):  Nabs or sandwich if available. Diet soda, water Snk ( PM):  D ( PM): whatever she has available.  Snk ( PM): water Beverages: water, diet sodas Typically eats packaged foods or fast foods due to lack of refrigeration.   Usual physical activity: ADL  Estimated energy needs: 1500  calories 170 g carbohydrates 112 g protein 42 g fat  Progress Towards Goal(s):  In progress.   Nutritional Diagnosis:  NI-1.7 Predicted excessive energy intake As related to Obesity and diet recall .  As evidenced by BMI 57.    Intervention:  Nutrition and Diabetes education provided on My Plate, CHO counting, meal planning, portion sizes, timing of meals. Reviewed local shelters, food pantries for access to food and shelter. Case worker will be calling her.  She has been in contact with Ross Marcus for her PCP needs. Will contact Teryl Lucy at [facility]. Encouraged to focus on more fresh fruits and vegetables when available and increase walking.   Contact local food pantries. Expect a call from [facility] to assist with housing, meals and medications.    Teaching Method Utilized:  Visual Auditory Hands on  Handouts  given during visit include:  The Plate Method   Meal Plan Card  Housing numbers  Contact information for local food pantries and local shelter.  Barriers to learning/adherence to lifestyle change: homelessness  Demonstrated degree of understanding via:  Teach Back   Monitoring/Evaluation:  Dietary intake, exercise, and body weight in 1 month(s). She notes she is depressed due to her current living situation. Advised to contact PCP. Will make referral to [facility] for assistance.

## 2018-12-15 ENCOUNTER — Encounter: Payer: Self-pay | Admitting: Nutrition

## 2018-12-15 NOTE — Patient Instructions (Signed)
Goals Talk to [employer] for [employer] pantries for food assistance Walk for 30 minutes a day

## 2018-12-26 ENCOUNTER — Telehealth: Payer: Self-pay | Admitting: Family Medicine

## 2018-12-26 MED ORDER — DICLOFENAC SODIUM 75 MG PO TBEC: 75.0000 mg | DELAYED_RELEASE_TABLET | Freq: Two times a day (BID) | ORAL | 1 refills | Status: DC

## 2018-12-26 NOTE — Telephone Encounter (Signed)
I sent it again though the prior prescription notes it was received by the pharmacy ([facility] [address] in [place]) on 4/7.  Make sure address is correct, please.  Thanks!

## 2018-12-26 NOTE — Telephone Encounter (Signed)
Patient requesting for the Voltaren Rx to be resent to [facility] in [place]. She states they do not have the prescription

## 2018-12-27 NOTE — Telephone Encounter (Signed)
Informed patient that prescription is at [facility]

## 2019-01-11 ENCOUNTER — Encounter: Payer: No Typology Code available for payment source | Attending: Physician Assistant | Admitting: Nutrition

## 2019-01-11 ENCOUNTER — Other Ambulatory Visit: Payer: Self-pay

## 2019-01-11 NOTE — Progress Notes (Signed)
Medical Nutrition Therapy:  Appt start time: 1330 end time:  1400.  Assessment:  Primary concerns today: .Obesity. Currently homeless and living out of her daughter's car.  . Was contacted by [facility]. Didn't qualifty for housing due to living in a car at times. Living with whomever she can.    Was contacted with [facility] but can't get medicine due to lack of transportation.  Gets food when she can. Doesn't have anyone to help her. Her daughter took her car back.  Unable to exercise due to her size. Lacks access to healthy food due to transportation and unable to keep it fresh. Appears depressed.   Wt Readings from Last 3 Encounters:  11/22/18 (!) 380 lb (172.4 kg)  11/01/18 (!) 354 lb (160.6 kg)  10/18/18 (!) 370 lb (167.8 kg)   Ht Readings from Last 3 Encounters:  11/22/18 5\' 6"  (1.676 m)  11/01/18 5\' 6"  (1.676 m)  10/18/18 5\' 6"  (1.676 m)   There is no height or weight on file to calculate BMI. @BMIFA @ Facility age limit for growth percentiles is 20 years. Facility age limit for growth percentiles is 20 years.  CMP Latest Ref Rng & Units 08/30/2018 12/24/2017 12/23/2017  Glucose 70 - 99 mg/dL 87 409(W108(H) 119(J147(H)  BUN 6 - 20 mg/dL 7 16 10   Creatinine 0.44 - 1.00 mg/dL 4.780.66 2.950.90 6.210.74  Sodium 135 - 145 mmol/L 137 138 141  Potassium 3.5 - 5.1 mmol/L 3.9 3.6 3.8  Chloride 98 - 111 mmol/L 103 103 104  CO2 22 - 32 mmol/L 26 29 26   Calcium 8.9 - 10.3 mg/dL 9.2 9.3 30.810.1  Total Protein 6.5 - 8.1 g/dL 7.7 7.9 6.5(H8.4(H)  Total Bilirubin 0.3 - 1.2 mg/dL 0.6 0.5 0.7  Alkaline Phos 38 - 126 U/L 83 77 84  AST 15 - 41 U/L 15 25 18   ALT 0 - 44 U/L 13 35 16     Preferred Learning Style:     No preference indicated   Learning Readiness:   Ready  Change in progress   MEDICATIONS:    DIETARY INTAKE:  24-hr recall:  B ( AM):  Egg biscuit or eggs and toast or nabs Snk ( AM):  L ( PM):  Nabs or sandwich if available. Diet soda, water Snk ( PM):  D ( PM): whatever she has  available.  Snk ( PM): water Beverages: water, diet sodas Typically eats packaged foods or fast foods due to lack of refrigeration.   Usual physical activity: ADL  Estimated energy needs: 1500  calories 170 g carbohydrates 112 g protein 42 g fat  Progress Towards Goal(s):  In progress.   Nutritional Diagnosis:  NI-1.7 Predicted excessive energy intake As related to Obesity and diet recall .  As evidenced by BMI 57.    Intervention:  Nutrition and Diabetes education provided on My Plate, CHO counting, meal planning, portion sizes, timing of meals. Reviewed local shelters, food pantries for access to food and shelter. Case worker will be calling her.  She has been in contact with Ross Marcusarla Broadnax for her PCP needs. Will [facility]. Encouraged to focus on more fresh fruits and vegetables when available and increase walking.   Stay in contact with Social services and DEPT of housing to see if you can get housing. Drink water only  Try to eat more fruit and vegetables when you can get them. Walk when you can for exercise.    Teaching Method Utilized:  Visual Auditory Hands on  Handouts given during visit include:  The Plate Method   Meal Plan Card  Housing numbers  Contact information for local food pantries and local shelter.  Barriers to learning/adherence to lifestyle change: homelessness  Demonstrated degree of understanding via:  Teach Back   Monitoring/Evaluation:  Dietary intake, exercise, and body weight in 1 month(s). She notes she is depressed due to her current living situation. Advised to contact PCP Needs better intervention for her living situation.

## 2019-01-25 ENCOUNTER — Encounter (INDEPENDENT_AMBULATORY_CARE_PROVIDER_SITE_OTHER): Payer: Self-pay | Admitting: Neurology

## 2019-01-25 ENCOUNTER — Other Ambulatory Visit: Payer: Self-pay

## 2019-01-25 ENCOUNTER — Ambulatory Visit (INDEPENDENT_AMBULATORY_CARE_PROVIDER_SITE_OTHER): Payer: Self-pay | Admitting: Neurology

## 2019-01-25 ENCOUNTER — Encounter: Payer: Self-pay | Admitting: Nutrition

## 2019-01-25 DIAGNOSIS — G5603 Carpal tunnel syndrome, bilateral upper limbs: Secondary | ICD-10-CM

## 2019-01-25 DIAGNOSIS — Z0289 Encounter for other administrative examinations: Secondary | ICD-10-CM

## 2019-01-25 NOTE — Procedures (Signed)
Full Name: Sandra ArtistVeronica Edell Gender: Female MRN #: [id number] Date of Birth: [birth year]    Visit Date: 01/25/2019 10:54 Age: 48 Years 1 Months Old Examining Physician: Levert FeinsteinYijun Cieanna Stormes, MD  Referring Physician: Norton BlizzardShane Hudnall, MD History: 48 years old female presented with 4 years history of bilateral hands paresthesia.  Summary of the Tests: Nerve Conduction Study: Bilateral median sensory responses were absent.  Bilateral median motor responses showed moderately prolonged distal latency, with normal CMAP amplitude.   Bilateral ulnar sensory and motor responses were normal.  Electromyography: Selected needle examination showed no significant abnormalities, with exceptions of mildly decreased recruitment patterns at bilateral abductor pollicis brevis.    Conclusion: This is an abnormal study.  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    ------------------------------- Levert FeinsteinYijun Skylier Kretschmer, M.D.Ph.D.  [facility] [address] [place], [place] [address] Tel: [phone] Fax: [phone]        [facility]    Nerve / Sites Muscle Latency Ref. Amplitude Ref. Rel Amp Segments Distance Velocity Ref. Area    ms ms mV mV %  cm m/s m/s mVms  R Median - APB     Wrist APB 6.7 ?4.4 7.9 ?4.0 100 Wrist - APB 7   25.2     Upper arm APB 10.9  7.6  96 Upper arm - Wrist 23 55 ?49 23.6  L Median - APB     Wrist APB 6.5 ?4.4 8.4 ?4.0 100 Wrist - APB 7   21.5     Upper arm APB 10.5  8.0  95.3 Upper arm - Wrist 22 56 ?49 21.2  R Ulnar - ADM     Wrist ADM 2.6 ?3.3 8.8 ?6.0 100 Wrist - ADM 7   22.5     B.Elbow ADM 5.9  8.8  99.9 B.Elbow - Wrist 19 58 ?49 24.2     A.Elbow ADM 7.9  8.3  94.1 A.Elbow - B.Elbow 10 51 ?49 23.3         A.Elbow - Wrist      L Ulnar - ADM     Wrist ADM 2.4 ?3.3 7.4 ?6.0 100 Wrist - ADM 7   19.8     B.Elbow ADM 5.6  6.8  91.9 B.Elbow  - Wrist 18 58 ?49 17.7     A.Elbow ADM 7.6  6.5  95.7 A.Elbow - B.Elbow 10 51 ?49 17.0         A.Elbow - Wrist                 SNC    Nerve / Sites Rec. Site Peak Lat Ref.  Amp Ref. Segments Distance    ms ms V V  cm  R Median - Orthodromic (Dig II, Mid palm)     Dig II Wrist NR ?3.4 NR ?10 Dig II - Wrist 13  L Median - Orthodromic (Dig II, Mid palm)     Dig II Wrist NR ?3.4 NR ?10 Dig II - Wrist 13  R Ulnar - Orthodromic, (Dig V, Mid palm)     Dig V Wrist 2.5 ?3.1 8 ?5 Dig V - Wrist 11  L Ulnar - Orthodromic, (Dig V, Mid palm)     Dig V Wrist 2.7 ?3.1 5 ?5 Dig V - Wrist 5711              F  Wave  Nerve F Lat Ref.   ms ms  R Ulnar - ADM 24.9 ?32.0  L Ulnar - ADM 25.9 ?32.0         EMG full       EMG Summary Table    Spontaneous MUAP Recruitment  Muscle IA Fib PSW Fasc Other Amp Dur. Poly Pattern  L. Abductor pollicis brevis Normal None None None _______ Normal Normal Normal Reduced  L. Pronator teres Normal None None None _______ Normal Normal Normal Normal  L. Biceps brachii Normal None None None _______ Normal Normal Normal Normal  L. Deltoid Normal None None None _______ Normal Normal Normal Normal  L. First dorsal interosseous Normal None None None _______ Normal Normal Normal Normal  R. Abductor pollicis brevis Normal None None None _______ Normal Normal Normal Reduced

## 2019-01-25 NOTE — Patient Instructions (Signed)
Stay in contact with Social services and DEPT of housing to see if you can get housing. Drink water only  Try to eat more fruit and vegetables when you can get them. Walk when you can for exercise.

## 2019-02-14 ENCOUNTER — Telehealth: Payer: Self-pay

## 2019-03-15 ENCOUNTER — Ambulatory Visit: Payer: Self-pay | Admitting: Family Medicine

## 2019-04-06 ENCOUNTER — Ambulatory Visit: Payer: Self-pay | Admitting: Physician Assistant

## 2019-05-07 ENCOUNTER — Other Ambulatory Visit: Payer: Self-pay

## 2019-05-07 ENCOUNTER — Encounter (HOSPITAL_COMMUNITY): Payer: Self-pay | Admitting: Emergency Medicine

## 2019-05-07 ENCOUNTER — Emergency Department (HOSPITAL_COMMUNITY)
Admission: EM | Admit: 2019-05-07 | Discharge: 2019-05-07 | Disposition: A | Discharge status: Home / Self Care | Payer: No Typology Code available for payment source | Attending: Emergency Medicine | Admitting: Emergency Medicine

## 2019-05-07 DIAGNOSIS — K5289 Other specified noninfective gastroenteritis and colitis: Secondary | ICD-10-CM | POA: Insufficient documentation

## 2019-05-07 DIAGNOSIS — E119 Type 2 diabetes mellitus without complications: Secondary | ICD-10-CM | POA: Insufficient documentation

## 2019-05-07 DIAGNOSIS — K529 Noninfective gastroenteritis and colitis, unspecified: Secondary | ICD-10-CM

## 2019-05-07 DIAGNOSIS — F17210 Nicotine dependence, cigarettes, uncomplicated: Secondary | ICD-10-CM | POA: Insufficient documentation

## 2019-05-07 LAB — CBC
HCT: 42.7 % (ref 36.0–46.0)
Hemoglobin: 13.4 g/dL (ref 12.0–15.0)
MCH: 29.9 pg (ref 26.0–34.0)
MCHC: 31.4 g/dL (ref 30.0–36.0)
MCV: 95.3 fL (ref 80.0–100.0)
Platelets: 223 10*3/uL (ref 150–400)
RBC: 4.48 MIL/uL (ref 3.87–5.11)
RDW: 14.4 % (ref 11.5–15.5)
WBC: 10.9 10*3/uL — ABNORMAL HIGH (ref 4.0–10.5)
nRBC: 0 % (ref 0.0–0.2)

## 2019-05-07 LAB — URINALYSIS, ROUTINE W REFLEX MICROSCOPIC
Bilirubin Urine: NEGATIVE
Glucose, UA: NEGATIVE mg/dL
Hgb urine dipstick: NEGATIVE
Ketones, ur: NEGATIVE mg/dL
Leukocytes,Ua: NEGATIVE
Nitrite: NEGATIVE
Protein, ur: NEGATIVE mg/dL
Specific Gravity, Urine: 1.017 (ref 1.005–1.030)
pH: 5 (ref 5.0–8.0)

## 2019-05-07 LAB — COMPREHENSIVE METABOLIC PANEL
ALT: 25 U/L (ref 0–44)
AST: 27 U/L (ref 15–41)
Albumin: 3.7 g/dL (ref 3.5–5.0)
Alkaline Phosphatase: 89 U/L (ref 38–126)
Anion gap: 11 (ref 5–15)
BUN: 7 mg/dL (ref 6–20)
CO2: 24 mmol/L (ref 22–32)
Calcium: 9.1 mg/dL (ref 8.9–10.3)
Chloride: 105 mmol/L (ref 98–111)
Creatinine, Ser: 0.76 mg/dL (ref 0.44–1.00)
GFR calc Af Amer: >60 mL/min (ref >60)
GFR calc non Af Amer: >60 mL/min (ref >60)
Glucose, Bld: 119 mg/dL — ABNORMAL HIGH (ref 70–99)
Potassium: 3.7 mmol/L (ref 3.5–5.1)
Sodium: 140 mmol/L (ref 135–145)
Total Bilirubin: 0.4 mg/dL (ref 0.3–1.2)
Total Protein: 7.1 g/dL (ref 6.5–8.1)

## 2019-05-07 LAB — LIPASE, BLOOD: Lipase: 27 U/L (ref 11–51)

## 2019-05-07 MED ORDER — ONDANSETRON HCL 4 MG/2ML IJ SOLN
4.0000 mg | Freq: Once | INTRAMUSCULAR | Status: AC
  Administered 2019-05-07: 13:00:00 4 mg via INTRAVENOUS
  Filled 2019-05-07: qty 2

## 2019-05-07 MED ORDER — SODIUM CHLORIDE 0.9% FLUSH: 3.0000 mL | Freq: Once | INTRAVENOUS | Status: DC

## 2019-05-07 MED ORDER — ONDANSETRON HCL 4 MG PO TABS: 4.0000 mg | ORAL_TABLET | Freq: Four times a day (QID) | ORAL | 0 refills | Status: DC

## 2019-05-07 NOTE — ED Triage Notes (Signed)
Patient c/o sudden nausea and vomiting that started approx 10 minutes before coming to ED. Denies any abd pain or diarrhea. Per patient last BM last night-normal. Denies any blood in stool or emesis. Patient states vomited x1-yellow colored emesis. Unsure of any fever.

## 2019-05-07 NOTE — ED Notes (Signed)
Pt left without written d/c instructions.

## 2019-05-07 NOTE — ED Provider Notes (Signed)
[facility] EMERGENCY DEPARTMENT Provider Note   CSN: [id number] Arrival date & time: 05/07/19  1143     History   Chief Complaint Chief Complaint  Patient presents with  . Emesis    HPI Sandra Orr is a 48 y.o. female.     Patient is a 48 year old female who presents to the emergency department with a complaint of nausea and vomiting.  The patient states that approximately 10 to 15 minutes prior to her arrival in the emergency department the patient had problems with nausea and vomiting.  She says the vomitus looked a little yellowish to her.  It was not accompanied by diarrhea.  There is no complaint of abdominal pain.  Patient has not had any recent finger.  There is been no recent injury or trauma to the abdomen.  There is been no changes in his stool that the patient is aware of.  She presents to the emergency department for evaluation of this issue.  The history is provided by the patient.    Past Medical History:  Diagnosis Date  . Arthritis   . Back pain   . Carpal tunnel syndrome   . Depression   . DM type 2 (diabetes mellitus, type 2) (HCC) 02/04/2015   A1c 6.5 (01/11/15)   . GERD (gastroesophageal reflux disease)   . Obesity     Patient Active Problem List   Diagnosis Date Noted  . Primary localized osteoarthritis of knees, bilateral 12/22/2017  . Chronic pain of left knee 08/03/2016  . 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 Abdominal pain 10/18/2015  . Constipation 10/18/2015  . Rash and nonspecific skin eruption 10/18/2015  . Bilateral knee pain 07/15/2015  . Carpal tunnel syndrome 07/15/2015  . Homelessness 05/30/2015  . Essential hypertension, benign 05/30/2015  . Morbid obesity (HCC) 05/04/2012  . Depression 05/04/2012  . Chronic pain 05/04/2012  . Tobacco abuse 05/04/2012    Past Surgical History:  Procedure Laterality Date  . CHOLECYSTECTOMY    . TUBAL LIGATION       OB History    Gravida  3   Para  3   Term  3   Preterm       AB      Living  3     SAB      TAB      Ectopic      Multiple      Live Births  3            Home Medications    Prior to Admission medications   Medication Sig Start Date End Date Taking? Authorizing Provider  Acetaminophen (TYLENOL PO) Take by mouth.    [provider]  albuterol (PROVENTIL HFA;VENTOLIN HFA) 108 (90 Base) MCG/ACT inhaler Inhale 2 puffs into the lungs every 6 (six) hours as needed for wheezing or shortness of breath.    [provider]  diclofenac (VOLTAREN) 75 MG EC tablet Take 1 tablet (75 mg total) by mouth 2 (two) times daily. 12/26/18   Lenda KelpHudnall, Shane R, MD    Family History Family History  Problem Relation Age of Onset  . Depression Mother        bipolar/schizo  . Heart disease Mother   . Diabetes Mother   . Asthma Father   . Hyperlipidemia Father   . Depression Maternal Grandmother   . Heart disease Maternal Grandmother   . Diabetes Maternal Grandmother   . Hypertension Maternal Grandfather   . Breast cancer Paternal Aunt  Social History Social History   Tobacco Use  . Smoking status: Current Every Day Smoker    Packs/day: 0.50    Years: 31.00    Pack years: 15.50    Types: Cigarettes  . Smokeless tobacco: Never Used  Substance Use Topics  . Alcohol use: No    Alcohol/week: 0.0 standard drinks  . Drug use: Yes    Frequency: 1.0 times per week    Types: Marijuana, Cocaine    Comment: MJ twice a month. no coke in since 2010     Allergies   Penicillins   Review of Systems Review of Systems  Constitutional: Negative for activity change and appetite change.  HENT: Negative for congestion, ear discharge, ear pain, facial swelling, nosebleeds, rhinorrhea, sneezing and tinnitus.   Eyes: Negative for photophobia, pain and discharge.  Respiratory: Negative for cough, choking, shortness of breath and wheezing.   Cardiovascular: Negative for chest pain, palpitations and leg swelling.   Gastrointestinal: Positive for diarrhea and vomiting. Negative for abdominal pain, blood in stool, constipation and nausea.  Genitourinary: Negative for difficulty urinating, dysuria, flank pain, frequency and hematuria.  Musculoskeletal: Negative for back pain, gait problem, myalgias and neck pain.  Skin: Negative for color change, rash and wound.  Neurological: Negative for dizziness, seizures, syncope, facial asymmetry, speech difficulty, weakness and numbness.  Hematological: Negative for adenopathy. Does not bruise/bleed easily.  Psychiatric/Behavioral: Negative for agitation, confusion, hallucinations, self-injury and suicidal ideas. The patient is not nervous/anxious.      Physical Exam Updated Vital Signs BP 110/74 (BP Location: Right Arm)   Pulse 76   Temp 97.9 F (36.6 C) (Oral)   Resp 20   Ht 5\' 5"  (1.651 m)   Wt (!) 217.7 kg   LMP 05/06/2016   SpO2 96%   BMI 79.88 kg/m   Physical Exam Vitals signs and nursing note reviewed.  Constitutional:      Appearance: She is well-developed. She is not toxic-appearing.  HENT:     Head: Normocephalic.     Right Ear: Tympanic membrane and external ear normal.     Left Ear: Tympanic membrane and external ear normal.  Eyes:     General: Lids are normal.     Pupils: Pupils are equal, round, and reactive to light.  Neck:     Musculoskeletal: Normal range of motion and neck supple.     Vascular: No carotid bruit.  Cardiovascular:     Rate and Rhythm: Normal rate and regular rhythm.     Pulses: Normal pulses.     Heart sounds: Normal heart sounds.  Pulmonary:     Effort: No respiratory distress.     Breath sounds: Normal breath sounds.  Abdominal:     General: Bowel sounds are normal.     Palpations: Abdomen is soft.     Tenderness: There is no abdominal tenderness. There is no right CVA tenderness, left CVA tenderness or guarding.  Musculoskeletal: Normal range of motion.  Lymphadenopathy:     Head:     Right side of  head: No submandibular adenopathy.     Left side of head: No submandibular adenopathy.     Cervical: No cervical adenopathy.  Skin:    General: Skin is warm and dry.  Neurological:     Mental Status: She is alert and oriented to person, place, and time.     Cranial Nerves: No cranial nerve deficit.     Sensory: No sensory deficit.  Psychiatric:  Speech: Speech normal.      ED Treatments / Results  Labs (all labs ordered are listed, but only abnormal results are displayed) Labs Reviewed  CBC - Abnormal; Notable for the following components:      Result Value   WBC 10.9 (*)    All other components within normal limits  LIPASE, BLOOD  COMPREHENSIVE METABOLIC PANEL  URINALYSIS, ROUTINE W REFLEX MICROSCOPIC    EKG None  Radiology No results found.  Procedures Procedures (including critical care time)  Medications Ordered in ED Medications  sodium chloride flush (NS) 0.9 % injection 3 mL (3 mLs Intravenous Not Given 05/07/19 1205)     Initial Impression / Assessment and Plan / ED Course  I have reviewed the triage vital signs and the nursing notes.  Pertinent labs & imaging results that were available during my care of the patient were reviewed by me and considered in my medical decision making (see chart for details).          Final Clinical Impressions(s) / ED Diagnoses MDM  Vital signs within normal limits.  Patient states that approximately 10 to 15 minutes prior to her arrival in the emergency department she had episode of nausea vomiting.  No fever, no chills, no pain.  IV Zofran given to the patient.  Urine analysis is within normal limits.  Lipase is normal at 27.  Comprehensive metabolic panel is within normal limits.  Glucose is 119, doubt this event is related to the diabetes.  The anion gap is normal at 11.  The complete blood count shows the white blood cells to be slightly elevated at 10,900, there is no shift to the left.  Hemoglobin and  hematocrit are normal.  Platelets are normal.  After the IV medication, the patient is ambulatory without nausea and without vomiting.  She says she feels much better.  Repeat vital signs within normal limits with exception of a bradycardia of 51 bpm.  Patient discharged home with prescription for Zofran.  Patient given instructions to use clear liquids over the next 24 to 48 hours, and then to gradually advance her diet.  The patient is in agreement with this plan.  Patient will return to the emergency department if any changes in her condition, worsening of her symptoms, problems or concerns.   Final diagnoses:  Gastroenteritis    ED Discharge Orders         Ordered    ondansetron (ZOFRAN) 4 MG tablet  Every 6 hours     05/07/19 1506           Ivery Quale, PA-C 05/07/19 1542    Bethann Berkshire, MD 05/11/19 [phone]

## 2019-05-07 NOTE — Discharge Instructions (Addendum)
Your lab work is mostly within normal limits.  Your vital signs are within normal limits.  Your examination favors a viral illness.  Please use Zofran every 6 hours for nausea.  Please use clear liquids over the next 24 to 48 hours, and then gradually advance your diet.  Please wash hands frequently.  Continue to use your mask.  Please return to the emergency department if you develop high fever, vomiting blood, or passing blood rectally.  Return if there is unusual weakness, changes in your condition, problems or concerns.

## 2019-05-10 ENCOUNTER — Ambulatory Visit: Payer: Self-pay | Admitting: Physician Assistant

## 2019-05-10 ENCOUNTER — Encounter: Payer: Self-pay | Admitting: Physician Assistant

## 2019-05-10 DIAGNOSIS — M170 Bilateral primary osteoarthritis of knee: Secondary | ICD-10-CM

## 2019-05-10 DIAGNOSIS — G5603 Carpal tunnel syndrome, bilateral upper limbs: Secondary | ICD-10-CM

## 2019-05-10 DIAGNOSIS — F329 Major depressive disorder, single episode, unspecified: Secondary | ICD-10-CM

## 2019-05-10 DIAGNOSIS — F17200 Nicotine dependence, unspecified, uncomplicated: Secondary | ICD-10-CM

## 2019-05-10 DIAGNOSIS — J449 Chronic obstructive pulmonary disease, unspecified: Secondary | ICD-10-CM

## 2019-05-10 DIAGNOSIS — F32A Depression, unspecified: Secondary | ICD-10-CM

## 2019-05-10 NOTE — Progress Notes (Signed)
LMP 05/06/2016    Subjective:    [name], female    DOB: [birth year], 48 y.o.   MRN: [id number]  [name] is a 48 y.o. female presenting on 05/10/2019 for Follow-up   HPI   This is a telemedicine appointment through [employer] due to coronavirus pandemic  I connected with  Sandra Orr on 05/10/19 by a video enabled telemedicine application and verified that I am speaking with the correct person using two identifiers.   I discussed the limitations of evaluation and management by telemedicine. The patient expressed understanding and agreed to proceed.   Pt is at home.  Provider is at office.    She is still smoking  She says Her breathing is not too good.  She is out of her inhaler.  She continues to smoke.   Pt has not weighed herself lately.  She says ER just wrote her weight down- she told them 380 but they recorded 480.  Pt states a lot of stress which is why she cant quit smoking.  She does report depression.  No anxiety.   She was on medication in the past and it helped.  She was on prozac she says.  Pt denies SI, HI.    Pt missed her follow up appointment with orthopedics-  She is asking for refill on diclofenac.     Relevant past medical, surgical, family and social history reviewed and updated as indicated. Interim medical history since our last visit reviewed. Allergies and medications reviewed and updated.   Current Outpatient Medications:  .  Acetaminophen (TYLENOL PO), Take by mouth., Disp: , Rfl:  .  UNABLE TO FIND, Take 1 capsule by mouth daily. Med Name: instaflex - glucosamine, other. To relieve discomfort, joint mobility., Disp: , Rfl:      Review of Systems  Per HPI unless specifically indicated above     Objective:    LMP 05/06/2016   Wt Readings from Last 3 Encounters:  05/07/19 (!) 480 lb (217.7 kg)  11/22/18 (!) 380 lb (172.4 kg)  11/01/18 (!) 354 lb (160.6 kg)    Physical Exam Constitutional:    General: She is not in acute distress.    Appearance: She is obese. She is not ill-appearing.  HENT:     Head: Normocephalic and atraumatic.  Pulmonary:     Effort: No respiratory distress.  Neurological:     Mental Status: She is alert and oriented to person, place, and time.  Psychiatric:        Attention and Perception: Attention normal.        Speech: Speech normal.        Behavior: Behavior is cooperative.     Results for orders placed or performed during the hospital encounter of 05/07/19  Lipase, blood  Result Value Ref Range   Lipase 27 11 - 51 U/L  Comprehensive metabolic panel  Result Value Ref Range   Sodium 140 135 - 145 mmol/L   Potassium 3.7 3.5 - 5.1 mmol/L   Chloride 105 98 - 111 mmol/L   CO2 24 22 - 32 mmol/L   Glucose, Bld 119 (H) 70 - 99 mg/dL   BUN 7 6 - 20 mg/dL   Creatinine, Ser 0.76 0.44 - 1.00 mg/dL   Calcium 9.1 8.9 - 10.3 mg/dL   Total Protein 7.1 6.5 - 8.1 g/dL   Albumin 3.7 3.5 - 5.0 g/dL   AST 27 15 - 41 U/L   ALT  25 0 - 44 U/L   Alkaline Phosphatase 89 38 - 126 U/L   Total Bilirubin 0.4 0.3 - 1.2 mg/dL   GFR calc non Af Amer >60 >60 mL/min   GFR calc Af Amer >60 >60 mL/min   Anion gap 11 5 - 15  CBC  Result Value Ref Range   WBC 10.9 (H) 4.0 - 10.5 K/uL   RBC 4.48 3.87 - 5.11 MIL/uL   Hemoglobin 13.4 12.0 - 15.0 g/dL   HCT 09.3 26.7 - 12.4 %   MCV 95.3 80.0 - 100.0 fL   MCH 29.9 26.0 - 34.0 pg   MCHC 31.4 30.0 - 36.0 g/dL   RDW 58.0 99.8 - 33.8 %   Platelets 223 150 - 400 K/uL   nRBC 0.0 0.0 - 0.2 %  Urinalysis, Routine w reflex microscopic  Result Value Ref Range   Color, Urine YELLOW YELLOW   APPearance HAZY (A) CLEAR   Specific Gravity, Urine 1.017 1.005 - 1.030   pH 5.0 5.0 - 8.0   Glucose, UA NEGATIVE NEGATIVE mg/dL   Hgb urine dipstick NEGATIVE NEGATIVE   Bilirubin Urine NEGATIVE NEGATIVE   Ketones, ur NEGATIVE NEGATIVE mg/dL   Protein, ur NEGATIVE NEGATIVE mg/dL   Nitrite NEGATIVE NEGATIVE   Leukocytes,Ua NEGATIVE  NEGATIVE      Assessment & Plan:    Encounter Diagnoses  Name Primary?  . Tobacco use disorder Yes  . Chronic obstructive pulmonary disease, unspecified COPD type (HCC)   . Morbid obesity (HCC)   . Depression, unspecified depression type   . Osteoarthritis of both knees, unspecified osteoarthritis type   . Bilateral carpal tunnel syndrome      HCM: PAP done- 12/19 Mammogram due jan 20  rx diclofenac (to [facility] [place] [facility]) rx prozac (to [facility]).  Pt is encouraged to contact [facility] for counseling.    rx Inhaler (to [facility]) counseled smoking cessation  Pt to follow up in 1 month to recheck mood.  She is to contact office sooner prn

## 2019-05-24 MED ORDER — DICLOFENAC SODIUM 75 MG PO TBEC: 75.0000 mg | DELAYED_RELEASE_TABLET | Freq: Two times a day (BID) | ORAL | 1 refills | Status: DC | PRN

## 2019-05-24 MED ORDER — FLUOXETINE HCL 20 MG PO CAPS: 20.0000 mg | ORAL_CAPSULE | Freq: Every day | ORAL | 0 refills | Status: DC

## 2019-05-24 MED ORDER — ALBUTEROL SULFATE HFA 108 (90 BASE) MCG/ACT IN AERS: 2.0000 | INHALATION_SPRAY | Freq: Four times a day (QID) | RESPIRATORY_TRACT | 1 refills | Status: DC | PRN

## 2019-06-05 ENCOUNTER — Encounter: Payer: Self-pay | Admitting: Physician Assistant

## 2019-06-05 ENCOUNTER — Ambulatory Visit: Payer: Self-pay | Admitting: Physician Assistant

## 2019-06-05 DIAGNOSIS — G8929 Other chronic pain: Secondary | ICD-10-CM

## 2019-06-05 DIAGNOSIS — F32A Depression, unspecified: Secondary | ICD-10-CM

## 2019-06-05 DIAGNOSIS — F329 Major depressive disorder, single episode, unspecified: Secondary | ICD-10-CM

## 2019-06-05 DIAGNOSIS — M25562 Pain in left knee: Secondary | ICD-10-CM

## 2019-06-05 DIAGNOSIS — F17200 Nicotine dependence, unspecified, uncomplicated: Secondary | ICD-10-CM

## 2019-06-05 NOTE — Progress Notes (Signed)
LMP 05/06/2016    Subjective:    [name], female    DOB: [birth year], 48 y.o.   MRN: [id number]  [name] is a 48 y.o. female presenting on 06/05/2019 for No chief complaint on file.   HPI    This is a telemedicine appointment through [employer] due to coronavirus pandemic.  I connected with  Sandra Orr on 06/05/19 by a video enabled telemedicine application and verified that I am speaking with the correct person using two identifiers.   I discussed the limitations of evaluation and management by telemedicine. The patient expressed understanding and agreed to proceed.  Pt is in her parked car somewhere.  Provider is at office   Pt hasn't contacted [facility] as recommended for counseling.  She says her Mood is improving a little.  She has No crying for no reason in about 2 weeks.   Pt has been walking.  Her knees hurt and she says that limits her walking.  She denies SI, HI.   She had been referred to orthopedics for her knees.  She missed her appointment in July.    Relevant past medical, surgical, family and social history reviewed and updated as indicated. Interim medical history since our last visit reviewed. Allergies and medications reviewed and updated.   Current Outpatient Medications:  .  Acetaminophen (TYLENOL PO), Take by mouth., Disp: , Rfl:  .  albuterol (VENTOLIN HFA) 108 (90 Base) MCG/ACT inhaler, Inhale 2 puffs into the lungs every 6 (six) hours as needed for wheezing or shortness of breath., Disp: 8 g, Rfl: 1 .  FLUoxetine (PROZAC) 20 MG capsule, Take 1 capsule (20 mg total) by mouth daily., Disp: 90 capsule, Rfl: 0 .  UNABLE TO FIND, Take 1 capsule by mouth daily. Med Name: instaflex - glucosamine, other. To relieve discomfort, joint mobility., Disp: , Rfl:  .  diclofenac (VOLTAREN) 75 MG EC tablet, Take 1 tablet (75 mg total) by mouth 2 (two) times daily as needed. (Patient not taking: Reported on 06/05/2019), Disp: 60  tablet, Rfl: 1    Review of Systems  Per HPI unless specifically indicated above     Objective:    LMP 05/06/2016   Wt Readings from Last 3 Encounters:  05/07/19 (!) 480 lb (217.7 kg)  11/22/18 (!) 380 lb (172.4 kg)  11/01/18 (!) 354 lb (160.6 kg)    Physical Exam Constitutional:      General: She is not in acute distress.    Appearance: She is obese. She is not ill-appearing.  HENT:     Head: Normocephalic and atraumatic.  Pulmonary:     Effort: No respiratory distress.  Neurological:     Mental Status: She is alert and oriented to person, place, and time.  Psychiatric:        Attention and Perception: Attention normal.        Mood and Affect: Mood normal.        Speech: Speech normal.        Behavior: Behavior is cooperative.     Comments: Pt seems improved from previous OV.  She is well groomed and makes good eye contact and smiles occasionally.           Assessment & Plan:    Encounter Diagnoses  Name Primary?  . Depression, unspecified depression type Yes  . Chronic pain of both knees   . Tobacco use disorder   . Morbid obesity (HCC)     -pt  to Continue prozac -encouraged pt Pt to Call [facility] for counseling -pt to Call orthopedics to reschedule the appointment she missed -pt to follow up 6 week to recheck mood.  She is to contact office sooner prn

## 2019-06-14 ENCOUNTER — Ambulatory Visit: Payer: Self-pay | Admitting: Family Medicine

## 2019-06-19 ENCOUNTER — Other Ambulatory Visit: Payer: Self-pay

## 2019-06-19 ENCOUNTER — Ambulatory Visit (INDEPENDENT_AMBULATORY_CARE_PROVIDER_SITE_OTHER): Payer: Self-pay | Admitting: Family Medicine

## 2019-06-19 VITALS — BP 154/65 | Ht 64.0 in | Wt 380.0 lb

## 2019-06-19 DIAGNOSIS — G5603 Carpal tunnel syndrome, bilateral upper limbs: Secondary | ICD-10-CM

## 2019-06-19 DIAGNOSIS — M170 Bilateral primary osteoarthritis of knee: Secondary | ICD-10-CM

## 2019-06-19 MED ORDER — METHYLPREDNISOLONE ACETATE 40 MG/ML IJ SUSP
40.0000 mg | Freq: Once | INTRAMUSCULAR | Status: AC
  Administered 2019-06-19: 40 mg via INTRA_ARTICULAR

## 2019-06-19 MED ORDER — METHYLPREDNISOLONE ACETATE 40 MG/ML IJ SUSP
40.0000 mg | Freq: Once | INTRAMUSCULAR | Status: AC
  Administered 2019-06-19: 12:00:00 40 mg via INTRA_ARTICULAR

## 2019-06-19 MED ORDER — MELOXICAM 15 MG PO TABS: 15.0000 mg | ORAL_TABLET | Freq: Every day | ORAL | 1 refills | Status: DC

## 2019-06-19 NOTE — Progress Notes (Signed)
PCP: Jacquelin Hawking, PA-C  Subjective:   HPI: Patient is a 48 y.o. female here for follow up for bilateral knee and wrist pain.  Patient was planned for 1 month follow-up in 12/2018, however patient was lost to follow-up, stating she had difficulties due to COVID-19.  Bilateral Knee pain Patient continues to complain of significant bilateral knee pain, primarily in anterior knee. Denies new trauma, falls.  Pain is described as moderate to severe and has not changed since previous visit.  Patient has tried and failed ibuprofen, diclofenac p.o. therapies, NSAID topical gels.  Attempted home exercises with little improvement.  Has had multiple corticosteroid injections in bilateral knees, with the last injections in 11/2018 being ineffective.  States that she only had about 2 weeks of relief after these injections and had irritation over the injection site.  Prior injections have given her 3 to 4 months of relief.  She had referral to dietitian and is continuing to follow with dietitian.  Bilateral wrist pain Patient complains of continued bilateral wrist pain, worse in right hand compared to left.  Patient is right-handed.  States that she feels like both hands are getting "weaker".  She is dropping objects more often, finds it difficult to turn keys.  Describes numbness and tingling sensation in 1st-3rd digits bilaterally.  She has tried nightly wrist splints for up to 6 months in the past without relief.  Never had steroid injections to carpal tunnel.  NCV/EMG performed after last office visit showed moderate bilateral carpal tunnel syndrome mainly demyelinating in nature.  No swelling.  Past Medical History:  Diagnosis Date  . Arthritis   . Back pain   . Carpal tunnel syndrome   . Depression   . DM type 2 (diabetes mellitus, type 2) (HCC) 02/04/2015   A1c 6.5 (01/11/15)   . GERD (gastroesophageal reflux disease)   . Obesity     Current Outpatient Medications on File Prior to Visit   Medication Sig Dispense Refill  . albuterol (VENTOLIN HFA) 108 (90 Base) MCG/ACT inhaler Inhale 2 puffs into the lungs every 6 (six) hours as needed for wheezing or shortness of breath. 8 g 1  . FLUoxetine (PROZAC) 20 MG capsule Take 1 capsule (20 mg total) by mouth daily. 90 capsule 0  . Acetaminophen (TYLENOL PO) Take by mouth.    . diclofenac (VOLTAREN) 75 MG EC tablet Take 1 tablet (75 mg total) by mouth 2 (two) times daily as needed. (Patient not taking: Reported on 06/05/2019) 60 tablet 1  . UNABLE TO FIND Take 1 capsule by mouth daily. Med Name: instaflex - glucosamine, other. To relieve discomfort, joint mobility.     No current facility-administered medications on file prior to visit.     Past Surgical History:  Procedure Laterality Date  . CHOLECYSTECTOMY    . TUBAL LIGATION      Allergies  Allergen Reactions  . Penicillins Hives    Has patient had a PCN reaction causing immediate rash, facial/tongue/throat swelling, SOB or lightheadedness with hypotension: No Has patient had a PCN reaction causing severe rash involving mucus membranes or skin necrosis: No Has patient had a PCN reaction that required hospitalization No Has patient had a PCN reaction occurring within the last 10 years: No If all of the above answers are "NO", then may proceed with Cephalosporin use.     Social History   Socioeconomic History  . Marital status: Single    Spouse name: Not on file  . Number of children: Not  on file  . Years of education: Not on file  . Highest education level: Not on file  Occupational History  . Not on file  Social Needs  . Financial resource strain: Not on file  . Food insecurity    Worry: Not on file    Inability: Not on file  . Transportation needs    Medical: Not on file    Non-medical: Not on file  Tobacco Use  . Smoking status: Current Every Day Smoker    Packs/day: 0.50    Years: 31.00    Pack years: 15.50    Types: Cigarettes  . Smokeless tobacco:  Never Used  Substance and Sexual Activity  . Alcohol use: No    Alcohol/week: 0.0 standard drinks  . Drug use: Yes    Frequency: 1.0 times per week    Types: Marijuana, Cocaine    Comment: MJ twice a month. no coke in since 2010  . Sexual activity: Not Currently    Birth control/protection: Surgical  Lifestyle  . Physical activity    Days per week: Not on file    Minutes per session: Not on file  . Stress: Not on file  Relationships  . Social Musicianconnections    Talks on phone: Not on file    Gets together: Not on file    Attends religious service: Not on file    Active member of club or organization: Not on file    Attends meetings of clubs or organizations: Not on file    Relationship status: Not on file  . Intimate partner violence    Fear of current or ex partner: Not on file    Emotionally abused: Not on file    Physically abused: Not on file    Forced sexual activity: Not on file  Other Topics Concern  . Not on file  Social History Narrative   Single, Unemployed, attended some high school. She stays with different friends, no stable home. Public transportation.    Family History  Problem Relation Age of Onset  . Depression Mother        bipolar/schizo  . Heart disease Mother   . Diabetes Mother   . Asthma Father   . Hyperlipidemia Father   . Depression Maternal Grandmother   . Heart disease Maternal Grandmother   . Diabetes Maternal Grandmother   . Hypertension Maternal Grandfather   . Breast cancer Paternal Aunt     BP (!) 154/65   Ht 5\' 4"  (1.626 m)   Wt (!) 380 lb (172.4 kg)   LMP 05/06/2016   BMI 65.23 kg/m   Review of Systems: See HPI above.     Objective:  Physical Exam:  Gen: NAD, comfortable in exam room  Right knee: No gross deformity, ecchymoses, effusion. TTP medial joint line.  Minimal tenderness patellar tendon insertion.  No other tenderness. FROM with 5/5 strength flexion and extension. Negative ant/post drawers. Negative  valgus/varus testing. Negative lachmans. Negative mcmurrays, patellar compression. NV intact distally.  Left knee: No gross deformity, ecchymoses, effusion. TTP medial joint line.  Minimal tenderness patellar tendon insertion.  No other tenderness. FROM with 5/5 strength flexion and extension. Negative ant/post drawers. Negative valgus/varus testing. Negative lachmans. Negative mcmurrays, patellar compression. NV intact distally.  Right hand/wrist: No deformity, swelling, bruising, atrophy. FROM with 5/5 strength abduction and extension but 4/5 thumb opposition. No tenderness to palpation. Positive tinels and phalens Sensation diminished 1st-3rd digits.  Left hand/wrist: No deformity, swelling, bruising, atrophy. FROM with 5/5  strength abduction and extension but 4/5 thumb opposition. No tenderness to palpation. Positive tinels and phalens Sensation diminished 1st-3rd digits.   Assessment & Plan:   1. Primary osteoarthritis of both knees -Chronic and pain not well controlled -Bilateral corticosteroid injection tolerated well by patient today in clinic -Start meloxicam 15 mg daily, after failing ibuprofen and diclofenac p.o. -Continue follow-up with dietary - Ambulatory referral to Physical Therapy - Has [facility] coverage but cannot do viscosupplementation with this.  Also cannot do arthroplasty with current BMI.  - follow up as needed  After informed written consent timeout was performed, patient was seated on exam table. Right knee was prepped with alcohol swab and utilizing anteromedial approach, patient's right knee was injected intraarticularly with 3:1 bupivicaine: depomedrol. Patient tolerated the procedure well without immediate complications.  After informed written consent timeout was performed, patient was seated on exam table. Left knee was prepped with alcohol swab and utilizing anteromedial approach, patient's left knee was injected intraarticularly with 3:1  bupivicaine: depomedrol. Patient tolerated the procedure well without immediate complications.  2. Bilateral carpal tunnel syndrome -EMG showing moderate bilateral carpal tunnel syndrome, mainly demyelinating in nature -Patient has failed NSAIDs, home exercises, home bracing -Ambulatory referral to Orthopedic Surgery to discuss surgery - follow with Orthopedic surgery for continued care  Signed Glennon Mac, DO 12:24 PM 06/19/19  Addendum:  Patient seen and examined with resident.  Agree with his note and findings.  Given bilateral intraarticular injections for her knee osteoarthritis - she will try meloxicam and start formal physical therapy as well.  Discussed options for her carpal tunnel - will go ahead with referral to discuss release given moderate nature - we also discussed injections.

## 2019-06-19 NOTE — Patient Instructions (Signed)
We will refer you to [facility] for your bilateral carpal tunnel. Continue wearing your wrist braces. meloxicam 15mg  daily with food for pain and inflammation.  You were given injections into both knees today. Take meloxicam as noted above. Don't take aleve or ibuprofen with this. Ok to take tylenol with this though. We will also refer you for physical therapy for your knees. Follow up with me as needed for your knees.

## 2019-06-20 ENCOUNTER — Encounter: Payer: Self-pay | Admitting: Family Medicine

## 2019-06-23 ENCOUNTER — Ambulatory Visit: Payer: Self-pay | Attending: Family Medicine | Admitting: Physical Therapy

## 2019-07-17 ENCOUNTER — Ambulatory Visit: Payer: Self-pay | Admitting: Physician Assistant

## 2019-07-17 ENCOUNTER — Encounter: Payer: Self-pay | Admitting: Physician Assistant

## 2019-07-17 DIAGNOSIS — E669 Obesity, unspecified: Secondary | ICD-10-CM

## 2019-07-17 DIAGNOSIS — F17200 Nicotine dependence, unspecified, uncomplicated: Secondary | ICD-10-CM

## 2019-07-17 DIAGNOSIS — F32A Depression, unspecified: Secondary | ICD-10-CM

## 2019-07-17 DIAGNOSIS — G8929 Other chronic pain: Secondary | ICD-10-CM

## 2019-07-17 DIAGNOSIS — F329 Major depressive disorder, single episode, unspecified: Secondary | ICD-10-CM

## 2019-07-17 DIAGNOSIS — M25562 Pain in left knee: Secondary | ICD-10-CM

## 2019-07-17 NOTE — Progress Notes (Signed)
   LMP 05/06/2016    Subjective:    [name], female    DOB: 09-Dec-1970, 48 y.o.   MRN: [id number]  [name] is a 47 y.o. female presenting on 07/17/2019 for No chief complaint on file.   HPI  This is a telemedicine appointment through [employer] due to coronavirus pandemic.  I connected with  Ilene Qua on 07/17/19 by a video enabled telemedicine application and verified that I am speaking with the correct person using two identifiers.   I discussed the limitations of evaluation and management by telemedicine. The patient expressed understanding and agreed to proceed.   Patient is a 48 year old female being seen for follow-up of depression.  At appointments in October and September she was encouraged to contact anyone for counseling.  She has been taking Prozac for her mood.  Patient says her mood is about the same same-she says that most days she just wants to be by herself.  She didn't contact [facility].  Again.  No SI, HI.  She is getting outside to walk/exercise.    Pt was seen by orthopedics for knees pain.   She is wearing mask when she goes out   Relevant past medical, surgical, family and social history reviewed and updated as indicated. Interim medical history since our last visit reviewed. Allergies and medications reviewed and updated.    Current Outpatient Medications:  .  albuterol (VENTOLIN HFA) 108 (90 Base) MCG/ACT inhaler, Inhale 2 puffs into the lungs every 6 (six) hours as needed for wheezing or shortness of breath., Disp: 8 g, Rfl: 1 .  FLUoxetine (PROZAC) 20 MG capsule, Take 1 capsule (20 mg total) by mouth daily., Disp: 90 capsule, Rfl: 0 .  meloxicam (MOBIC) 15 MG tablet, Take 1 tablet (15 mg total) by mouth daily. Take with food., Disp: 30 tablet, Rfl: 1   Review of Systems  Per HPI unless specifically indicated above     Objective:    LMP 05/06/2016   Wt Readings from Last 3 Encounters:  06/19/19 (!) 380 lb  (172.4 kg)  05/07/19 (!) 480 lb (217.7 kg)  11/22/18 (!) 380 lb (172.4 kg)    Physical Exam Constitutional:      General: She is not in acute distress.    Appearance: Normal appearance. She is obese. She is not ill-appearing.  HENT:     Head: Normocephalic and atraumatic.  Pulmonary:     Effort: Pulmonary effort is normal. No respiratory distress.  Neurological:     Mental Status: She is alert and oriented to person, place, and time.  Psychiatric:        Attention and Perception: Attention normal.        Speech: Speech normal.        Behavior: Behavior is cooperative.          Assessment & Plan:      1.  Depression  Patient is to continue her fluoxetine.  She is encouraged to call [facility] for counseling services.  She will follow up in 3 months for recheck of her mood.  She is to contact office sooner for any worsening or new symptoms.  2.  Knee pain Patient to continue with orthopedics per his recommendation.  3.  Healthcare maintenance Discussed screening mammogram and Pap smear and patient said that she wanted to take care of mammoram and PAP so those will not be arranged for this patient.

## 2019-07-18 ENCOUNTER — Ambulatory Visit: Payer: Self-pay | Admitting: Orthopaedic Surgery

## 2019-07-19 ENCOUNTER — Telehealth: Payer: Self-pay

## 2019-07-19 NOTE — Telephone Encounter (Signed)
Per pt-[facility] needed an updated copy of the [facility] Financial Letter and she wasn't able to obtain it in time for her appt. She has tried to reach out the financial aid office but hasn't been able to get in touch with anyone. I told the pt to keep trying. Once she is able to get the letter she will call [facility] to schedule an appt with one of the orthopedic surgeons. If she has any questions or concerns in the meantime she will call us.

## 2019-07-21 ENCOUNTER — Other Ambulatory Visit: Payer: Self-pay

## 2019-07-21 DIAGNOSIS — Z20822 Contact with and (suspected) exposure to covid-19: Secondary | ICD-10-CM

## 2019-07-24 LAB — NOVEL CORONAVIRUS, NAA: SARS-CoV-2, NAA: DETECTED — AB

## 2019-10-03 ENCOUNTER — Other Ambulatory Visit: Payer: Self-pay | Admitting: Physician Assistant

## 2019-10-03 DIAGNOSIS — Z1331 Encounter for screening for depression: Secondary | ICD-10-CM

## 2019-10-03 MED ORDER — ALBUTEROL SULFATE HFA 108 (90 BASE) MCG/ACT IN AERS: 2.0000 | INHALATION_SPRAY | Freq: Four times a day (QID) | RESPIRATORY_TRACT | 1 refills | Status: DC | PRN

## 2019-10-04 ENCOUNTER — Other Ambulatory Visit: Payer: Self-pay

## 2019-10-04 ENCOUNTER — Ambulatory Visit (INDEPENDENT_AMBULATORY_CARE_PROVIDER_SITE_OTHER): Payer: Self-pay | Admitting: Family Medicine

## 2019-10-04 DIAGNOSIS — M25561 Pain in right knee: Secondary | ICD-10-CM

## 2019-10-04 DIAGNOSIS — G8929 Other chronic pain: Secondary | ICD-10-CM

## 2019-10-04 DIAGNOSIS — M25562 Pain in left knee: Secondary | ICD-10-CM

## 2019-10-04 MED ORDER — METHYLPREDNISOLONE ACETATE 40 MG/ML IJ SUSP
40.0000 mg | Freq: Once | INTRAMUSCULAR | Status: AC
  Administered 2019-10-04: 40 mg via INTRA_ARTICULAR

## 2019-10-04 NOTE — Assessment & Plan Note (Addendum)
Patient with chronic bilateral knee pain.  Likely secondary to degenerative changes.  Imaging from January 2020 showing degenerative changes. Patient has been getting steroid injections which helped significantly.  Would like bilateral injections today.  Follow up for knee pain PRN  After informed written consent timeout was performed, patient was seated on exam table. Site confirmed with US guidance after finding via palpation. Right & Left knee were prepped with alcohol swab and utilizing anterolateral approach, patient's right knee was injected intraarticularly with 3:1 bupivicaine: depomedrol. Patient tolerated the procedure well without immediate complications.  Procedure directly supervised by Dr. Pearletha Forge

## 2019-10-04 NOTE — Patient Instructions (Signed)
You were given injections into both knees today. Aleve OR meloxicam if needed. Ok to take tylenol with this. Follow up with me as needed for your knees.

## 2019-10-04 NOTE — Progress Notes (Signed)
   Sandra Orr is a 49 y.o. female who presents to [facility] today for the following:  Bilateral knee pain  Patient with bilateral knee pain.  Patient states this is a chronic issue which she has been seen for in sports medicine in the past.  States that she gets steroid injections every 3 to 4 months which helped significantly.  Usually the injections last 3 months.  States that when she gets the injection she can function well and do her daily activities such as walking.  When she does not have injection she can barely do anything.  Has to use a cane or walker at home to ambulate. States that she has tried medications in the past but they did not help.  Was previously on Mobic which helped in the beginning but no longer helps.   PMH reviewed. Chronic pain, bilateral knee pain, HTN, obesity  ROS as above. Medications reviewed.  Exam:  BP 136/82   Ht 5\' 4"  (1.626 m)   Wt (!) 360 lb (163.3 kg)   LMP 05/06/2016   BMI 61.79 kg/m  Gen: Well NAD MSK: Knee: - Inspection: no gross deformity. No swelling/effusion, erythema or bruising. Skin intact - Palpation: TTP on medial joint line bilateral knees - ROM: full active ROM with flexion and extension in knee and hip but with pain on flexion  - Strength: 5/5 strength - Neuro/vasc: NV intact - Special Tests: -- MENISCUS: Pos McMurray's -- PF JOINT: nml patellar mobility bilaterally.  negative patellar grind, negative patellar apprehension  Hips: normal ROM, negative FABER and FADIR bilaterally  Assessment and Plan: 1) Bilateral knee pain Patient with chronic bilateral knee pain.  Likely secondary to degenerative changes.  Imaging from January 2020 showing degenerative changes. Patient has been getting steroid injections which helped significantly.  Would like bilateral injections today.  Follow up for knee pain PRN  After informed written consent timeout was performed, patient was seated on exam table. Timeout was performed.  Site confirmed  with February 2020 after finding lateral joint line via palpation. Right knee was prepped with alcohol swab and utilizing anterolateral approach, patient's right knee was injected intraarticularly with 3:1 bupivicaine: depomedrol. Patient tolerated the procedure well without immediate complications.  After informed written consent timeout was performed, patient was seated on exam table. Timeout was performed.  Site confirmed with [place] after finding lateral joint line via palpation. Left knee was prepped with alcohol swab and utilizing anterolateral approach, patient's left knee was injected intraarticularly with 3:1 bupivicaine: depomedrol. Patient tolerated the procedure well without immediate complications.  Procedure directly supervised by Dr. [place], DO, PGY-3 [facility] 10/04/2019 4:49 PM

## 2019-10-06 ENCOUNTER — Encounter: Payer: Self-pay | Admitting: Family Medicine

## 2019-10-11 ENCOUNTER — Other Ambulatory Visit: Payer: Self-pay | Admitting: Physician Assistant

## 2019-10-11 DIAGNOSIS — R7303 Prediabetes: Secondary | ICD-10-CM

## 2019-10-11 DIAGNOSIS — E785 Hyperlipidemia, unspecified: Secondary | ICD-10-CM

## 2019-10-11 NOTE — Progress Notes (Unsigned)
cmp

## 2019-10-16 ENCOUNTER — Other Ambulatory Visit: Payer: Self-pay

## 2019-10-16 DIAGNOSIS — Z1231 Encounter for screening mammogram for malignant neoplasm of breast: Secondary | ICD-10-CM

## 2019-10-17 ENCOUNTER — Ambulatory Visit: Payer: Self-pay | Admitting: Physician Assistant

## 2019-10-22 ENCOUNTER — Emergency Department (HOSPITAL_COMMUNITY)
Admission: EM | Admit: 2019-10-22 | Discharge: 2019-10-22 | Disposition: A | Discharge status: Home / Self Care | Payer: Self-pay | Attending: Emergency Medicine | Admitting: Emergency Medicine

## 2019-10-22 ENCOUNTER — Other Ambulatory Visit: Payer: Self-pay

## 2019-10-22 ENCOUNTER — Encounter (HOSPITAL_COMMUNITY): Payer: Self-pay | Admitting: *Deleted

## 2019-10-22 DIAGNOSIS — F17210 Nicotine dependence, cigarettes, uncomplicated: Secondary | ICD-10-CM | POA: Insufficient documentation

## 2019-10-22 DIAGNOSIS — I10 Essential (primary) hypertension: Secondary | ICD-10-CM | POA: Insufficient documentation

## 2019-10-22 DIAGNOSIS — K029 Dental caries, unspecified: Secondary | ICD-10-CM | POA: Insufficient documentation

## 2019-10-22 DIAGNOSIS — Z79899 Other long term (current) drug therapy: Secondary | ICD-10-CM | POA: Insufficient documentation

## 2019-10-22 DIAGNOSIS — E119 Type 2 diabetes mellitus without complications: Secondary | ICD-10-CM | POA: Insufficient documentation

## 2019-10-22 MED ORDER — CLINDAMYCIN HCL 150 MG PO CAPS: 150.0000 mg | ORAL_CAPSULE | Freq: Four times a day (QID) | ORAL | 0 refills | Status: DC

## 2019-10-22 MED ORDER — CLINDAMYCIN HCL 150 MG PO CAPS
300.0000 mg | ORAL_CAPSULE | Freq: Once | ORAL | Status: AC
  Administered 2019-10-22: 300 mg via ORAL
  Filled 2019-10-22: qty 2

## 2019-10-22 MED ORDER — HYDROCODONE-ACETAMINOPHEN 5-325 MG PO TABS
1.0000 | ORAL_TABLET | Freq: Once | ORAL | Status: AC
  Administered 2019-10-22: 14:00:00 1 via ORAL
  Filled 2019-10-22: qty 1

## 2019-10-22 MED ORDER — HYDROCODONE-ACETAMINOPHEN 5-325 MG PO TABS: 1.0000 | ORAL_TABLET | Freq: Four times a day (QID) | ORAL | 0 refills | Status: AC | PRN

## 2019-10-22 MED ORDER — NAPROXEN 250 MG PO TABS
500.0000 mg | ORAL_TABLET | Freq: Once | ORAL | Status: AC
  Administered 2019-10-22: 500 mg via ORAL
  Filled 2019-10-22: qty 2

## 2019-10-22 NOTE — ED Triage Notes (Signed)
Pt states broken on left lower and has gum swelling since yesterday.

## 2019-10-22 NOTE — ED Provider Notes (Signed)
[facility] EMERGENCY DEPARTMENT Provider Note   CSN: [id number] Arrival date & time: 10/22/19  1303     History Chief Complaint  Patient presents with  . Oral Swelling    Sandra Orr is a 49 y.o. female.  Patient is a 49 year old female with past medical history of depression and diabetes and obesity presenting to the emergency department for chest pain.  Reports poor dentition at baseline.  Has cracked teeth throughout.  Reports that yesterday the bottom left of her mouth began to feel more swollen and painful.  Denies fever, chills, trouble breathing, throat swelling.  Has not tried anything for relief.        Past Medical History:  Diagnosis Date  . Arthritis   . Back pain   . Carpal tunnel syndrome   . Depression   . DM type 2 (diabetes mellitus, type 2) (HCC) 02/04/2015   A1c 6.5 (01/11/15)   . GERD (gastroesophageal reflux disease)   . Obesity     Patient Active Problem List   Diagnosis Date Noted  . Primary localized osteoarthritis of knees, bilateral 12/22/2017  . Chronic pain of left knee 08/03/2016  . 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 Abdominal pain 10/18/2015  . Constipation 10/18/2015  . Rash and nonspecific skin eruption 10/18/2015  . Bilateral knee pain 07/15/2015  . Carpal tunnel syndrome 07/15/2015  . Homelessness 05/30/2015  . Essential hypertension, benign 05/30/2015  . Morbid obesity (HCC) 05/04/2012  . Depression 05/04/2012  . Chronic pain 05/04/2012  . Tobacco abuse 05/04/2012    Past Surgical History:  Procedure Laterality Date  . CHOLECYSTECTOMY    . TUBAL LIGATION       OB History    Gravida  3   Para  3   Term  3   Preterm      AB      Living  3     SAB      TAB      Ectopic      Multiple      Live Births  3           Family History  Problem Relation Age of Onset  . Depression Mother        bipolar/schizo  . Heart disease Mother   . Diabetes Mother   . Asthma Father   . Hyperlipidemia  Father   . Depression Maternal Grandmother   . Heart disease Maternal Grandmother   . Diabetes Maternal Grandmother   . Hypertension Maternal Grandfather   . Breast cancer Paternal Aunt     Social History   Tobacco Use  . Smoking status: Current Every Day Smoker    Packs/day: 0.50    Years: 31.00    Pack years: 15.50    Types: Cigarettes  . Smokeless tobacco: Never Used  Substance Use Topics  . Alcohol use: No    Alcohol/week: 0.0 standard drinks  . Drug use: Yes    Frequency: 1.0 times per week    Types: Marijuana, Cocaine    Comment: MJ twice a month. no coke in since 2010    Home Medications Prior to Admission medications   Medication Sig Start Date End Date Taking? Authorizing Provider  albuterol (VENTOLIN HFA) 108 (90 Base) MCG/ACT inhaler Inhale 2 puffs into the lungs every 6 (six) hours as needed for wheezing or shortness of breath. 10/03/19   Jacquelin Hawking, PA-C  clindamycin (CLEOCIN) 150 MG capsule Take 1 capsule (150 mg total) by mouth every  6 (six) hours. 10/22/19   Arlyn Dunning, PA-C  FLUoxetine (PROZAC) 20 MG capsule Take 1 capsule (20 mg total) by mouth daily. 05/24/19   Jacquelin Hawking, PA-C  HYDROcodone-acetaminophen (NORCO/VICODIN) 5-325 MG tablet Take 1 tablet by mouth every 6 (six) hours as needed for up to 2 days for severe pain. 10/22/19 10/24/19  Arlyn Dunning, PA-C  meloxicam (MOBIC) 15 MG tablet Take 1 tablet (15 mg total) by mouth daily. Take with food. 06/19/19   Lenda Kelp, MD    Allergies    Penicillins  Review of Systems   Review of Systems  Constitutional: Negative for chills and fever.  HENT: Positive for dental problem. Negative for facial swelling, sore throat and trouble swallowing.   Respiratory: Negative for cough and shortness of breath.   Neurological: Negative for facial asymmetry.    Physical Exam Updated Vital Signs BP (!) 144/115 (BP Location: Right Arm)   Pulse 66   Temp 98.5 F (36.9 C) (Oral)   Resp 20   Ht 5\' 1"   (1.549 m)   Wt 127 kg   LMP 05/06/2016   SpO2 99%   BMI 52.91 kg/m   Physical Exam Vitals and nursing note reviewed.  Constitutional:      Appearance: Normal appearance.  HENT:     Head: Normocephalic.     Mouth/Throat:     Mouth: Mucous membranes are moist.     Dentition: Abnormal dentition. Dental tenderness, gingival swelling and dental caries present. No dental abscesses or gum lesions.     Tongue: No lesions. Tongue does not deviate from midline.     Palate: No mass and lesions.     Pharynx: Oropharynx is clear.      Comments: No airway compromise.  No concerns for Ludwig's angina. Eyes:     Conjunctiva/sclera: Conjunctivae normal.  Pulmonary:     Effort: Pulmonary effort is normal.  Skin:    General: Skin is dry.  Neurological:     Mental Status: She is alert.  Psychiatric:        Mood and Affect: Mood normal.     ED Results / Procedures / Treatments   Labs (all labs ordered are listed, but only abnormal results are displayed) Labs Reviewed - No data to display  EKG None  Radiology No results found.  Procedures Procedures (including critical care time)  Medications Ordered in ED Medications  naproxen (NAPROSYN) tablet 500 mg (has no administration in time range)  HYDROcodone-acetaminophen (NORCO/VICODIN) 5-325 MG per tablet 1 tablet (has no administration in time range)  clindamycin (CLEOCIN) capsule 300 mg (has no administration in time range)    ED Course  I have reviewed the triage vital signs and the nursing notes.  Pertinent labs & imaging results that were available during my care of the patient were reviewed by me and considered in my medical decision making (see chart for details).  Clinical Course as of Oct 22 1410  10-31-1982 Oct 22, 2019  1410 Dental pain and swelling acute on chronic.  No airway compromise or signs of abscess on exam.  No concern for Ludwick's angina.  Patient treated for pain and started on clindamycin as she has a allergy  reaction to penicillin.  Advised to follow-up with dentist as soon as possible.  Advised on return precautions.   [KM]    Clinical Course User Index [KM] Oct 24, 2019   MDM Rules/Calculators/A&P  Based on review of vitals, medical screening exam, lab work and/or imaging, there does not appear to be an acute, emergent etiology for the patient's symptoms. Counseled pt on good return precautions and encouraged both PCP and ED follow-up as needed.  Prior to discharge, I also discussed incidental imaging findings with patient in detail and advised appropriate, recommended follow-up in detail.  Clinical Impression: 1. Pain due to dental caries     Disposition: Discharge  Prior to providing a prescription for a controlled substance, I independently reviewed the patient's recent prescription history on the [place]. The patient had no recent or regular prescriptions and was deemed appropriate for a brief, less than 3 day prescription of narcotic for acute analgesia.  This note was prepared with assistance of Systems analyst. Occasional wrong-word or sound-a-like substitutions may have occurred due to the inherent limitations of voice recognition software.  Final Clinical Impression(s) / ED Diagnoses Final diagnoses:  Pain due to dental caries    Rx / DC Orders ED Discharge Orders         Ordered    clindamycin (CLEOCIN) 150 MG capsule  Every 6 hours     10/22/19 1412    HYDROcodone-acetaminophen (NORCO/VICODIN) 5-325 MG tablet  Every 6 hours PRN     10/22/19 1412           Kristine Royal 10/22/19 1412    Isla Pence, MD 10/22/19 [phone]

## 2019-10-22 NOTE — Discharge Instructions (Signed)
Thank you for allowing me to care for you today. Please return to the emergency department if you have new or worsening symptoms. Take your medications as instructed.  ° °

## 2019-11-07 ENCOUNTER — Ambulatory Visit: Payer: Self-pay | Admitting: Physician Assistant

## 2019-11-09 ENCOUNTER — Ambulatory Visit: Payer: Self-pay | Admitting: Student

## 2019-11-09 IMAGING — MG DIGITAL SCREENING BILATERAL MAMMOGRAM WITH TOMO AND CAD
8 of 18 series · 8 of 40 positions shown · non-contrast
Comparison: Previous exam(s).

ACR Breast Density Category a: The breast tissue is almost entirely
fatty.

CLINICAL DATA: Screening.

EXAM:
DIGITAL SCREENING BILATERAL MAMMOGRAM WITH TOMO AND CAD

[R CC synth-2D (1 of 2)]
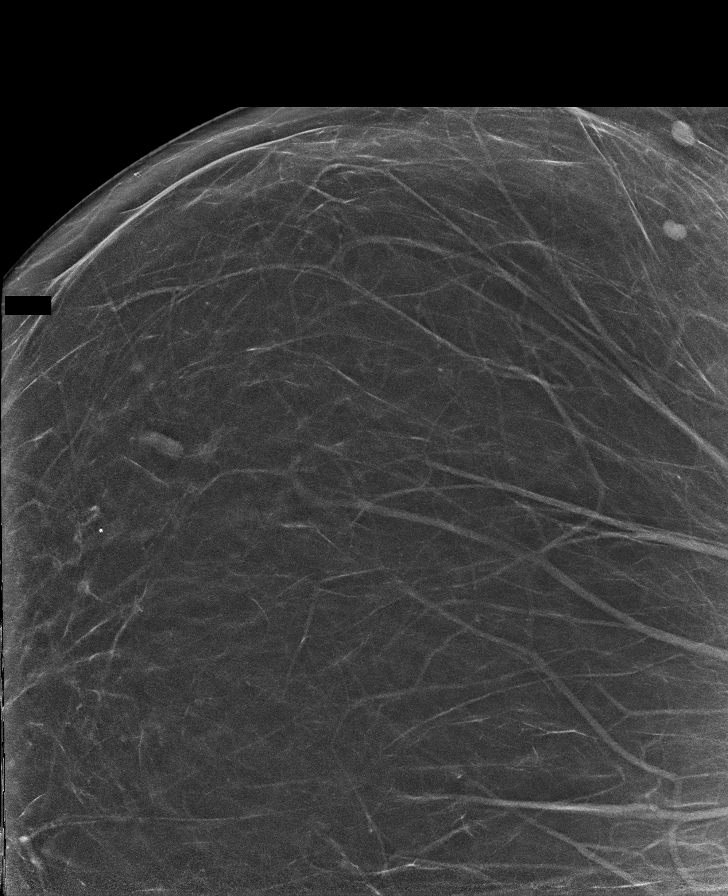

[R MLO synth-2D (1 of 2)]
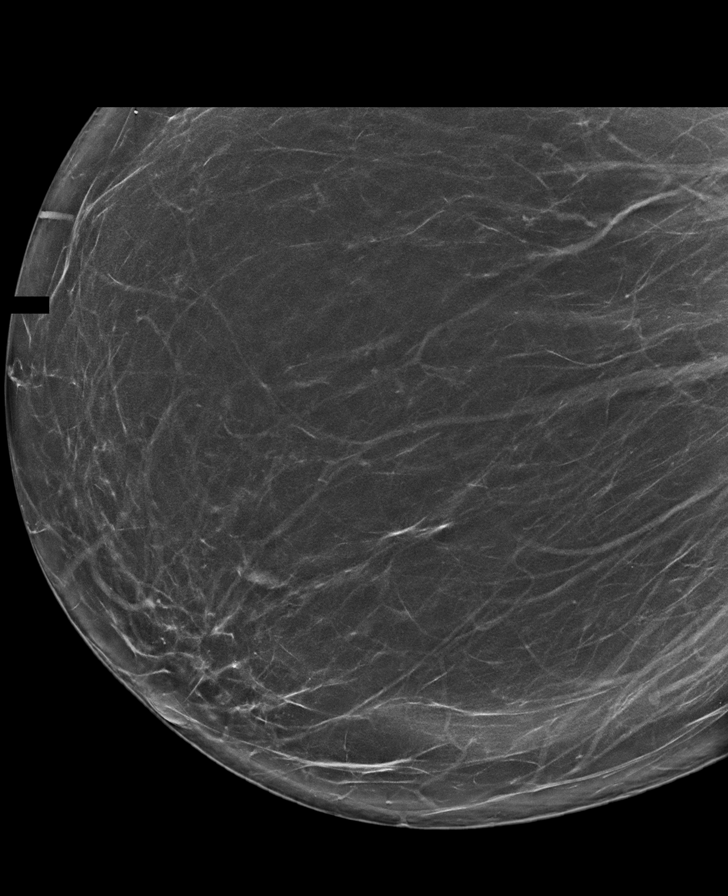

[R MLO synth-2D (2 of 2)]
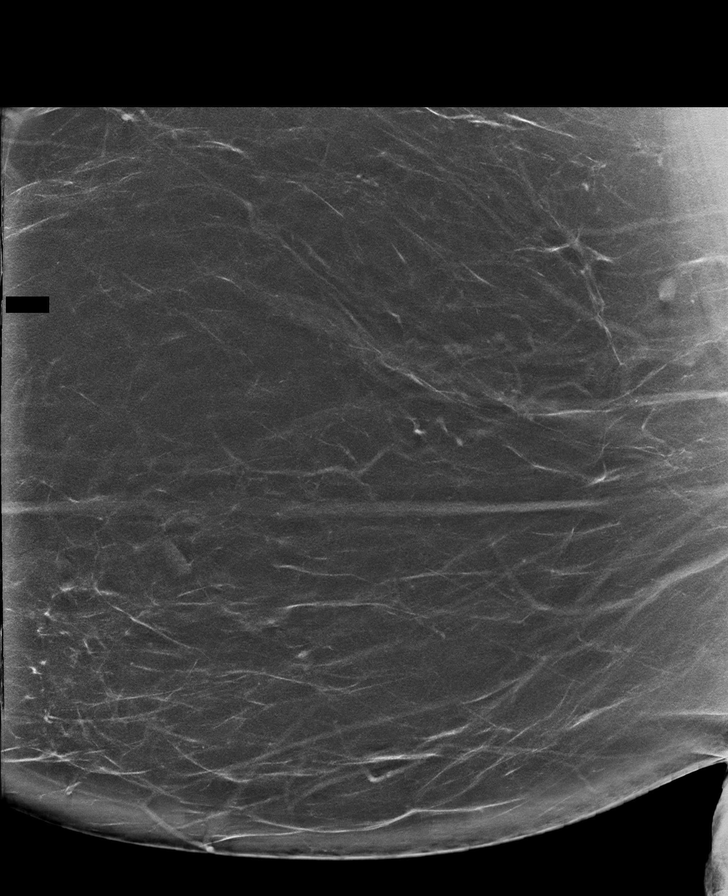

[L CC synth-2D]
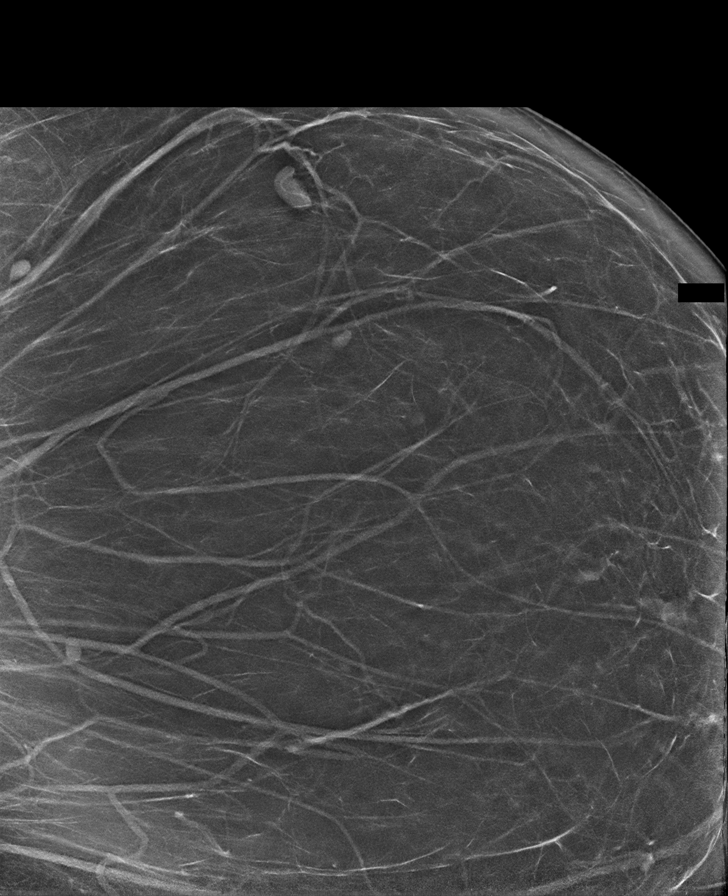

[R CC synth-2D (2 of 2)]
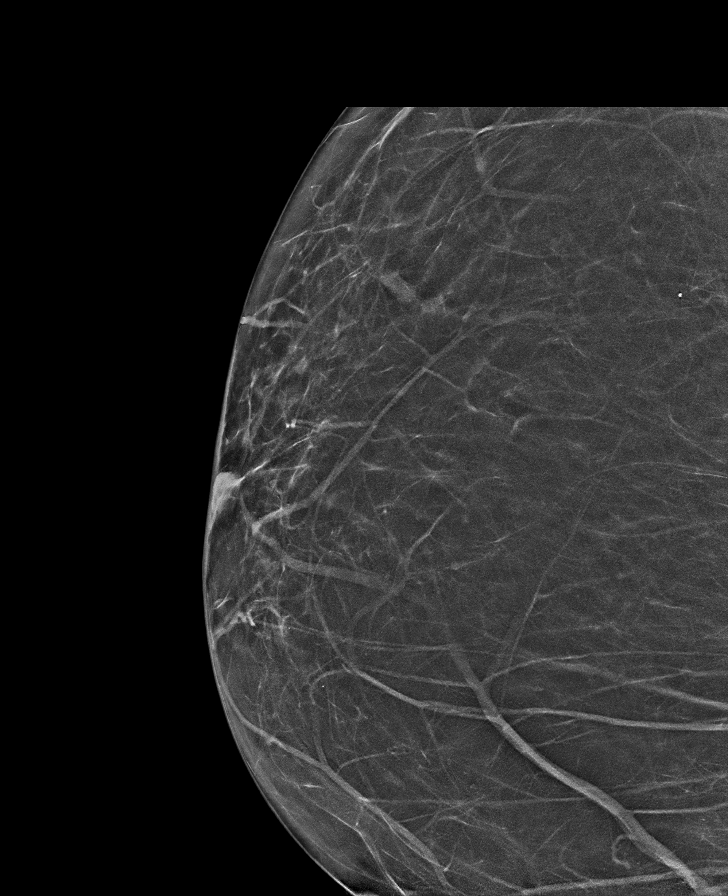

[L MLO synth-2D]
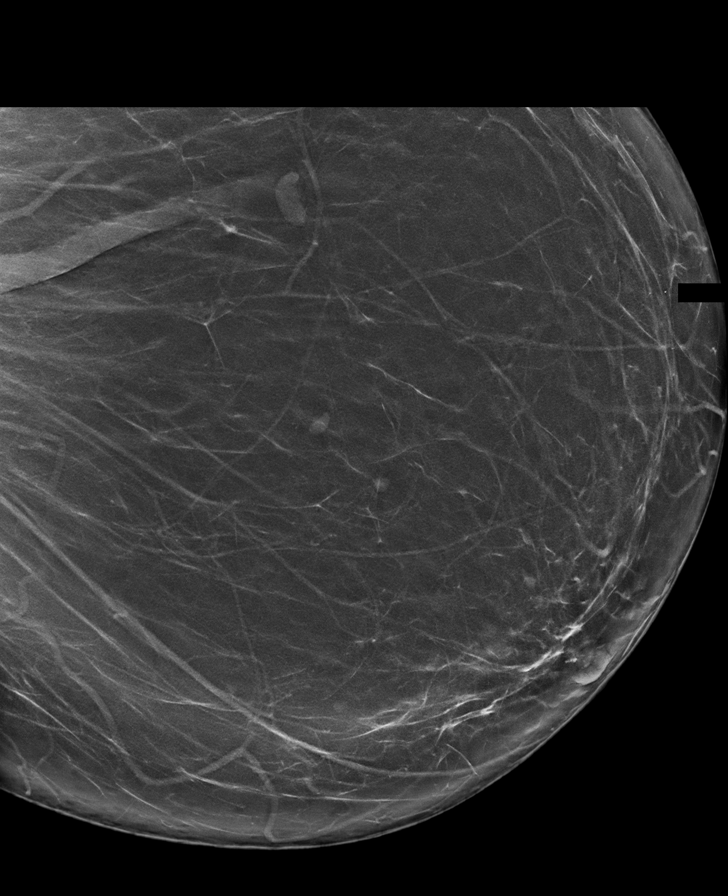

[R CV synth-2D]
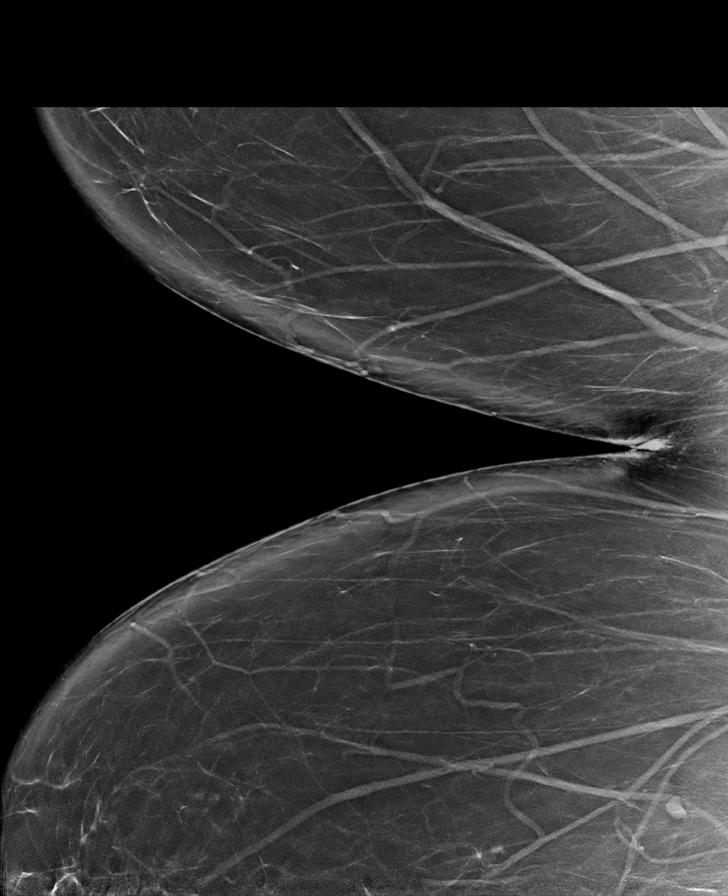

[R CV tomo · tomo slice 41/82.0]
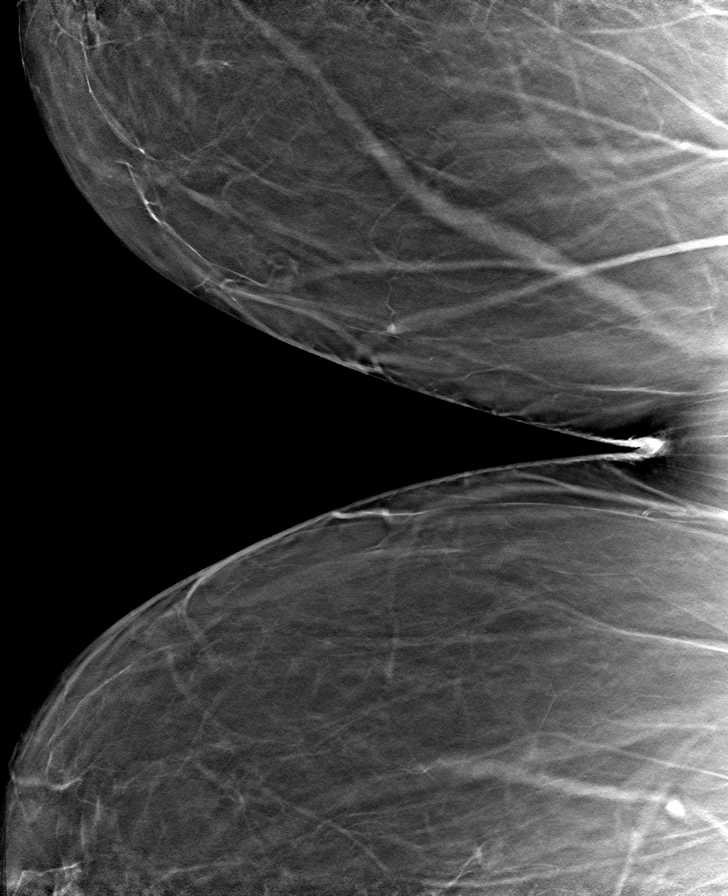

[8 of 40 positions shown; findings below may reference images not displayed]

FINDINGS: There are no findings suspicious for malignancy. Images were
processed with CAD.
IMPRESSION: No mammographic evidence of malignancy. A result letter of this
screening mammogram will be mailed directly to the patient.

RECOMMENDATION:
Screening mammogram in one year. (Code:[id number]-[id number])

BI-RADS CATEGORY  1: Negative.

## 2019-11-23 ENCOUNTER — Ambulatory Visit: Payer: Self-pay | Admitting: Student

## 2020-07-08 ENCOUNTER — Ambulatory Visit: Payer: Self-pay | Admitting: Family Medicine

## 2020-08-05 ENCOUNTER — Emergency Department (HOSPITAL_COMMUNITY)
Admission: EM | Admit: 2020-08-05 | Discharge: 2020-08-05 | Disposition: A | Discharge status: Home / Self Care | Payer: Medicaid Other | Attending: Emergency Medicine | Admitting: Emergency Medicine

## 2020-08-05 ENCOUNTER — Other Ambulatory Visit: Payer: Self-pay

## 2020-08-05 ENCOUNTER — Emergency Department (HOSPITAL_COMMUNITY): Payer: Medicaid Other

## 2020-08-05 ENCOUNTER — Encounter (HOSPITAL_COMMUNITY): Payer: Self-pay | Admitting: Emergency Medicine

## 2020-08-05 DIAGNOSIS — I10 Essential (primary) hypertension: Secondary | ICD-10-CM | POA: Insufficient documentation

## 2020-08-05 DIAGNOSIS — E119 Type 2 diabetes mellitus without complications: Secondary | ICD-10-CM | POA: Diagnosis not present

## 2020-08-05 DIAGNOSIS — M545 Low back pain, unspecified: Secondary | ICD-10-CM | POA: Insufficient documentation

## 2020-08-05 DIAGNOSIS — F17210 Nicotine dependence, cigarettes, uncomplicated: Secondary | ICD-10-CM | POA: Insufficient documentation

## 2020-08-05 DIAGNOSIS — Z79899 Other long term (current) drug therapy: Secondary | ICD-10-CM | POA: Diagnosis not present

## 2020-08-05 DIAGNOSIS — M5440 Lumbago with sciatica, unspecified side: Secondary | ICD-10-CM

## 2020-08-05 MED ORDER — HYDROCODONE-ACETAMINOPHEN 5-325 MG PO TABS: 1.0000 | ORAL_TABLET | Freq: Four times a day (QID) | ORAL | 0 refills | Status: DC | PRN

## 2020-08-05 MED ORDER — HYDROMORPHONE HCL 1 MG/ML IJ SOLN
1.0000 mg | Freq: Once | INTRAMUSCULAR | Status: AC
  Administered 2020-08-05: 1 mg via INTRAMUSCULAR
  Filled 2020-08-05: qty 1

## 2020-08-05 MED ORDER — KETOPROFEN 50 MG PO CAPS: 50.0000 mg | ORAL_CAPSULE | Freq: Four times a day (QID) | ORAL | 2 refills | Status: DC | PRN

## 2020-08-05 NOTE — ED Triage Notes (Signed)
Pain c/o joint and back pain x 2 days. Pain to left wrist, bilateral knees and lower back.

## 2020-08-05 NOTE — ED Notes (Signed)
Requests covid test

## 2020-08-05 NOTE — ED Provider Notes (Signed)
[facility] EMERGENCY DEPARTMENT Provider Note   CSN: [id number] Arrival date & time: 08/05/20  1455     History Chief Complaint  Patient presents with  . Joint Pain    Sandra Orr is a 49 y.o. female.  Patient here for lower back pain. She also has chronic knee pain. Has been seen by orthopedics in [place] and has injections in her back but states he will not give her any pain medicine. Patient is obese with a BMI of almost 50.  The history is provided by the patient and medical records. No language interpreter was used.  Back Pain Location:  Lumbar spine Quality:  Aching Radiates to:  Does not radiate Pain severity:  Moderate Pain is:  Worse during the day Onset quality:  Gradual Timing:  Constant Progression:  Worsening Relieved by:  Nothing Worsened by:  Nothing Ineffective treatments:  None tried Associated symptoms: no abdominal pain, no chest pain and no headaches        Past Medical History:  Diagnosis Date  . Arthritis   . Back pain   . Carpal tunnel syndrome   . Depression   . DM type 2 (diabetes mellitus, type 2) (HCC) 02/04/2015   A1c 6.5 (01/11/15)   . GERD (gastroesophageal reflux disease)   . Obesity     Patient Active Problem List   Diagnosis Date Noted  . Primary localized osteoarthritis of knees, bilateral 12/22/2017  . Chronic pain of left knee 08/03/2016  . 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 Abdominal pain 10/18/2015  . Constipation 10/18/2015  . Rash and nonspecific skin eruption 10/18/2015  . Bilateral knee pain 07/15/2015  . Carpal tunnel syndrome 07/15/2015  . Homelessness 05/30/2015  . Essential hypertension, benign 05/30/2015  . Morbid obesity (HCC) 05/04/2012  . Depression 05/04/2012  . Chronic pain 05/04/2012  . Tobacco abuse 05/04/2012    Past Surgical History:  Procedure Laterality Date  . CHOLECYSTECTOMY    . TUBAL LIGATION       OB History    Gravida  3   Para  3   Term  3   Preterm       AB      Living  3     SAB      IAB      Ectopic      Multiple      Live Births  3           Family History  Problem Relation Age of Onset  . Depression Mother        bipolar/schizo  . Heart disease Mother   . Diabetes Mother   . Asthma Father   . Hyperlipidemia Father   . Depression Maternal Grandmother   . Heart disease Maternal Grandmother   . Diabetes Maternal Grandmother   . Hypertension Maternal Grandfather   . Breast cancer Paternal Aunt     Social History   Tobacco Use  . Smoking status: Current Every Day Smoker    Packs/day: 0.50    Years: 31.00    Pack years: 15.50    Types: Cigarettes  . Smokeless tobacco: Never Used  Vaping Use  . Vaping Use: Never used  Substance Use Topics  . Alcohol use: No    Alcohol/week: 0.0 standard drinks  . Drug use: Yes    Frequency: 1.0 times per week    Types: Marijuana, Cocaine    Comment: MJ twice a month. no coke in since 2010    Home  Medications Prior to Admission medications   Medication Sig Start Date End Date Taking? Authorizing Provider  diclofenac Sodium (VOLTAREN) 1 % GEL 4 g 4 (four) times daily.   Yes [provider]  albuterol (VENTOLIN HFA) 108 (90 Base) MCG/ACT inhaler Inhale 2 puffs into the lungs every 6 (six) hours as needed for wheezing or shortness of breath. Patient not taking: No sig reported 10/03/19   Jacquelin Hawking, PA-C  clindamycin (CLEOCIN) 150 MG capsule Take 1 capsule (150 mg total) by mouth every 6 (six) hours. Patient not taking: No sig reported 10/22/19   Arlyn Dunning, PA-C  FLUoxetine (PROZAC) 20 MG capsule Take 1 capsule (20 mg total) by mouth daily. Patient not taking: No sig reported 05/24/19   Jacquelin Hawking, PA-C  HYDROcodone-acetaminophen (NORCO/VICODIN) 5-325 MG tablet Take 1 tablet by mouth every 6 (six) hours as needed. 08/05/20   Bethann Berkshire, MD  ketoprofen (ORUDIS) 50 MG capsule Take 1 capsule (50 mg total) by mouth every 6 (six) hours as needed.  08/05/20   Bethann Berkshire, MD  meloxicam (MOBIC) 15 MG tablet Take 1 tablet (15 mg total) by mouth daily. Take with food. Patient not taking: No sig reported 06/19/19   Lenda Kelp, MD    Allergies    Penicillins  Review of Systems   Review of Systems  Constitutional: Negative for appetite change and fatigue.  HENT: Negative for congestion, ear discharge and sinus pressure.   Eyes: Negative for discharge.  Respiratory: Negative for cough.   Cardiovascular: Negative for chest pain.  Gastrointestinal: Negative for abdominal pain and diarrhea.  Genitourinary: Negative for frequency and hematuria.  Musculoskeletal: Positive for back pain.       Bilateral knee pain  Skin: Negative for rash.  Neurological: Negative for seizures and headaches.  Psychiatric/Behavioral: Negative for hallucinations.    Physical Exam Updated Vital Signs BP (!) 127/55 (BP Location: Right Arm)   Pulse (!) 59   Temp 97.9 F (36.6 C) (Oral)   Resp 20   Ht 5\' 4"  (1.626 m)   Wt 127 kg   LMP 05/06/2016   SpO2 98%   BMI 48.06 kg/m   Physical Exam Vitals reviewed.  Constitutional:      Appearance: She is well-developed.  HENT:     Head: Normocephalic.     Nose: Nose normal.  Eyes:     General: No scleral icterus.    Extraocular Movements: EOM normal.     Conjunctiva/sclera: Conjunctivae normal.  Neck:     Thyroid: No thyromegaly.  Cardiovascular:     Rate and Rhythm: Normal rate and regular rhythm.     Heart sounds: No murmur heard. No friction rub. No gallop.   Pulmonary:     Breath sounds: No stridor. No wheezing or rales.  Chest:     Chest wall: No tenderness.  Abdominal:     General: There is no distension.     Tenderness: There is no abdominal tenderness. There is no rebound.  Musculoskeletal:        General: No edema. Normal range of motion.     Cervical back: Neck supple.     Comments: Tenderness lumbar spine and bilateral knee tenderness  Lymphadenopathy:     Cervical: No  cervical adenopathy.  Skin:    Findings: No erythema or rash.  Neurological:     Mental Status: She is alert and oriented to person, place, and time.     Motor: No abnormal muscle tone.  Coordination: Coordination normal.  Psychiatric:        Mood and Affect: Mood and affect normal.        Behavior: Behavior normal.     ED Results / Procedures / Treatments   Labs (all labs ordered are listed, but only abnormal results are displayed) Labs Reviewed - No data to display  EKG None  Radiology DG Lumbar Spine Complete  Result Date: 08/05/2020 CLINICAL DATA:  Low pain EXAM: LUMBAR SPINE - COMPLETE 4+ VIEW COMPARISON:  None. FINDINGS: Frontal, lateral, spot lumbosacral lateral, and bilateral oblique views were obtained. There are 5 non-rib-bearing lumbar type vertebral bodies. There is no fracture or spondylolisthesis. There is moderate disc space narrowing at L4-5 and L5-S1. Other disc spaces appear unremarkable. There is facet osteoarthritic change on the left at L5-S1. IMPRESSION: Disc space narrowing at L4-5 and L5-S1. Facet osteoarthritic change on the left at L5-S1. No fracture or spondylolisthesis. Electronically Signed   By: Bretta Bang III M.D.   On: 08/05/2020 16:46    Procedures Procedures (including critical care time)  Medications Ordered in ED Medications  HYDROmorphone (DILAUDID) injection 1 mg (1 mg Intramuscular Given 08/05/20 1548)    ED Course  I have reviewed the triage vital signs and the nursing notes.  Pertinent labs & imaging results that were available during my care of the patient were reviewed by me and considered in my medical decision making (see chart for details).    MDM Rules/Calculators/A&P                          Lumbar spine shows narrowing of L4-L5 patient also has significant arthritis to both knees. She will be placed on ketoprofen and Vicodin and referred to orthopedics. Final Clinical Impression(s) / ED Diagnoses Final  diagnoses:  None    Rx / DC Orders ED Discharge Orders         Ordered    ketoprofen (ORUDIS) 50 MG capsule  Every 6 hours PRN        08/05/20 1720    HYDROcodone-acetaminophen (NORCO/VICODIN) 5-325 MG tablet  Every 6 hours PRN        08/05/20 1720           Bethann Berkshire, MD 08/05/20 1724

## 2020-08-05 NOTE — Discharge Instructions (Addendum)
Follow-up with Dr. Romeo Apple in the next 2 to 3 weeks.

## 2020-08-21 ENCOUNTER — Emergency Department (HOSPITAL_COMMUNITY)
Admission: EM | Admit: 2020-08-21 | Discharge: 2020-08-21 | Disposition: A | Discharge status: Home / Self Care | Payer: Medicaid Other | Attending: Emergency Medicine | Admitting: Emergency Medicine

## 2020-08-21 ENCOUNTER — Encounter (HOSPITAL_COMMUNITY): Payer: Self-pay | Admitting: Emergency Medicine

## 2020-08-21 ENCOUNTER — Other Ambulatory Visit: Payer: Self-pay

## 2020-08-21 DIAGNOSIS — Z20822 Contact with and (suspected) exposure to covid-19: Secondary | ICD-10-CM | POA: Insufficient documentation

## 2020-08-21 DIAGNOSIS — J45909 Unspecified asthma, uncomplicated: Secondary | ICD-10-CM | POA: Diagnosis not present

## 2020-08-21 DIAGNOSIS — R0602 Shortness of breath: Secondary | ICD-10-CM | POA: Diagnosis not present

## 2020-08-21 DIAGNOSIS — Z5321 Procedure and treatment not carried out due to patient leaving prior to being seen by health care provider: Secondary | ICD-10-CM | POA: Diagnosis not present

## 2020-08-21 LAB — RESP PANEL BY RT-PCR (FLU A&B, COVID) ARPGX2
Influenza A by PCR: NEGATIVE
Influenza B by PCR: NEGATIVE
SARS Coronavirus 2 by RT PCR: NEGATIVE

## 2020-08-21 NOTE — ED Triage Notes (Addendum)
Pt c/o shortness of breath, with an asthma attack. Pt denies having a rescue inhaler due to lack of funds.  Pt was given a nebulizer treatment by ems at 2100.  Pt denies covid exposure.  Pt had Oxygen saturations of 88 on RA and was placed on 2L [place] and sats rose to 90. Oxygen was increased to 4L and sats rose to 94.  Pt states the reason she came to the ED was for a covid test.

## 2020-08-22 ENCOUNTER — Other Ambulatory Visit: Payer: Self-pay | Admitting: Physician Assistant

## 2020-08-25 ENCOUNTER — Emergency Department (HOSPITAL_COMMUNITY): Payer: Medicaid Other

## 2020-08-25 ENCOUNTER — Emergency Department (HOSPITAL_COMMUNITY)
Admission: EM | Admit: 2020-08-25 | Discharge: 2020-08-26 | Disposition: A | Discharge status: Home / Self Care | Payer: Medicaid Other | Attending: Emergency Medicine | Admitting: Emergency Medicine

## 2020-08-25 ENCOUNTER — Encounter (HOSPITAL_COMMUNITY): Payer: Self-pay | Admitting: *Deleted

## 2020-08-25 ENCOUNTER — Other Ambulatory Visit: Payer: Self-pay

## 2020-08-25 DIAGNOSIS — R0602 Shortness of breath: Secondary | ICD-10-CM | POA: Diagnosis not present

## 2020-08-25 DIAGNOSIS — E876 Hypokalemia: Secondary | ICD-10-CM | POA: Insufficient documentation

## 2020-08-25 DIAGNOSIS — F17210 Nicotine dependence, cigarettes, uncomplicated: Secondary | ICD-10-CM | POA: Diagnosis not present

## 2020-08-25 DIAGNOSIS — E119 Type 2 diabetes mellitus without complications: Secondary | ICD-10-CM | POA: Insufficient documentation

## 2020-08-25 DIAGNOSIS — I10 Essential (primary) hypertension: Secondary | ICD-10-CM | POA: Insufficient documentation

## 2020-08-25 DIAGNOSIS — J4521 Mild intermittent asthma with (acute) exacerbation: Secondary | ICD-10-CM | POA: Insufficient documentation

## 2020-08-25 DIAGNOSIS — J45909 Unspecified asthma, uncomplicated: Secondary | ICD-10-CM | POA: Diagnosis present

## 2020-08-25 HISTORY — DX: Unspecified asthma, uncomplicated: J45.909

## 2020-08-25 NOTE — ED Triage Notes (Signed)
Pt states seen for asthma on Wednesday night.  Pt here for the same.  Pt denies having an inhaler.

## 2020-08-26 LAB — CBC WITH DIFFERENTIAL/PLATELET
Abs Immature Granulocytes: 0.02 10*3/uL (ref 0.00–0.07)
Basophils Absolute: 0.1 10*3/uL (ref 0.0–0.1)
Basophils Relative: 1 %
Eosinophils Absolute: 0.3 10*3/uL (ref 0.0–0.5)
Eosinophils Relative: 3 %
HCT: 40.2 % (ref 36.0–46.0)
Hemoglobin: 12.8 g/dL (ref 12.0–15.0)
Immature Granulocytes: 0 %
Lymphocytes Relative: 35 %
Lymphs Abs: 3.2 10*3/uL (ref 0.7–4.0)
MCH: 29.4 pg (ref 26.0–34.0)
MCHC: 31.8 g/dL (ref 30.0–36.0)
MCV: 92.2 fL (ref 80.0–100.0)
Monocytes Absolute: 0.5 10*3/uL (ref 0.1–1.0)
Monocytes Relative: 5 %
Neutro Abs: 5.2 10*3/uL (ref 1.7–7.7)
Neutrophils Relative %: 56 %
Platelets: 244 10*3/uL (ref 150–400)
RBC: 4.36 MIL/uL (ref 3.87–5.11)
RDW: 13.8 % (ref 11.5–15.5)
WBC: 9.1 10*3/uL (ref 4.0–10.5)
nRBC: 0 % (ref 0.0–0.2)

## 2020-08-26 LAB — COMPREHENSIVE METABOLIC PANEL
ALT: 16 U/L (ref 0–44)
AST: 16 U/L (ref 15–41)
Albumin: 3.3 g/dL — ABNORMAL LOW (ref 3.5–5.0)
Alkaline Phosphatase: 68 U/L (ref 38–126)
Anion gap: 10 (ref 5–15)
BUN: 7 mg/dL (ref 6–20)
CO2: 24 mmol/L (ref 22–32)
Calcium: 9.3 mg/dL (ref 8.9–10.3)
Chloride: 104 mmol/L (ref 98–111)
Creatinine, Ser: 0.83 mg/dL (ref 0.44–1.00)
GFR, Estimated: >60 mL/min (ref >60)
Glucose, Bld: 137 mg/dL — ABNORMAL HIGH (ref 70–99)
Potassium: 3 mmol/L — ABNORMAL LOW (ref 3.5–5.1)
Sodium: 138 mmol/L (ref 135–145)
Total Bilirubin: 0.4 mg/dL (ref 0.3–1.2)
Total Protein: 7.1 g/dL (ref 6.5–8.1)

## 2020-08-26 LAB — D-DIMER, QUANTITATIVE: D-Dimer, Quant: <0.27 ug/mL-FEU (ref 0.00–0.50)

## 2020-08-26 LAB — BRAIN NATRIURETIC PEPTIDE: B Natriuretic Peptide: 37 pg/mL (ref 0.0–100.0)

## 2020-08-26 MED ORDER — AEROCHAMBER Z-STAT PLUS/MEDIUM MISC
1.0000 | Freq: Once | Status: AC
  Administered 2020-08-26: 1

## 2020-08-26 MED ORDER — POTASSIUM CHLORIDE CRYS ER 20 MEQ PO TBCR
40.0000 meq | EXTENDED_RELEASE_TABLET | Freq: Once | ORAL | Status: AC
  Administered 2020-08-26: 40 meq via ORAL
  Filled 2020-08-26: qty 2

## 2020-08-26 MED ORDER — POTASSIUM CHLORIDE CRYS ER 20 MEQ PO TBCR: 20.0000 meq | EXTENDED_RELEASE_TABLET | Freq: Two times a day (BID) | ORAL | 0 refills | Status: DC

## 2020-08-26 MED ORDER — ALBUTEROL SULFATE HFA 108 (90 BASE) MCG/ACT IN AERS
2.0000 | INHALATION_SPRAY | Freq: Once | RESPIRATORY_TRACT | Status: AC
  Administered 2020-08-26: 2 via RESPIRATORY_TRACT
  Filled 2020-08-26: qty 6.7

## 2020-08-26 MED ORDER — PREDNISONE 50 MG PO TABS
60.0000 mg | ORAL_TABLET | Freq: Once | ORAL | Status: AC
  Administered 2020-08-26: 60 mg via ORAL
  Filled 2020-08-26: qty 1

## 2020-08-26 MED ORDER — PREDNISONE 20 MG PO TABS: ORAL_TABLET | ORAL | 0 refills | Status: DC

## 2020-08-26 NOTE — ED Provider Notes (Addendum)
[facility] EMERGENCY DEPARTMENT Provider Note   CSN: [id number] Arrival date & time: 08/25/20  2154   Time seen 11:54 PM  History Chief Complaint  Patient presents with  . Asthma    Sandra Orr is a 50 y.o. female.  HPI   Patient states she was having a asthma attack on January 5 and she came to the ED but states they were very backed up so she left without being seen.  She did have a COVID test done that day that was negative.  She also was tested for influenza AMB which were also negative.  She states she mainly gets short dyspnea on exertion and she basically just has the wheezing when she exerts herself.  She has some cough and sometimes has some white mucus production.  She denies fever, sore throat, or rhinorrhea.  She has not noted any swelling of her extremities.  She states her chest feels tight.  She states she started having asthma as an adult and mainly had it when she was having respiratory illnesses.  She states however this time she is not having any respiratory illness.  She states she has never had to be admitted and she usually is treated with steroids.  Patient states she smokes a pack a day.  Patient states she is borderline diabetic.  PCP [facility]  Past Medical History:  Diagnosis Date  . Arthritis   . Asthma   . Back pain   . Carpal tunnel syndrome   . Depression   . DM type 2 (diabetes mellitus, type 2) (HCC) 02/04/2015   A1c 6.5 (01/11/15)   . GERD (gastroesophageal reflux disease)   . Obesity     Patient Active Problem List   Diagnosis Date Noted  . Primary localized osteoarthritis of knees, bilateral 12/22/2017  . Chronic pain of left knee 08/03/2016  . 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 Abdominal pain 10/18/2015  . Constipation 10/18/2015  . Rash and nonspecific skin eruption 10/18/2015  . Bilateral knee pain 07/15/2015  . Carpal tunnel syndrome 07/15/2015  . Homelessness 05/30/2015  . Essential hypertension, benign  05/30/2015  . Morbid obesity (HCC) 05/04/2012  . Depression 05/04/2012  . Chronic pain 05/04/2012  . Tobacco abuse 05/04/2012    Past Surgical History:  Procedure Laterality Date  . CHOLECYSTECTOMY    . TUBAL LIGATION       OB History    Gravida  3   Para  3   Term  3   Preterm      AB      Living  3     SAB      IAB      Ectopic      Multiple      Live Births  3           Family History  Problem Relation Age of Onset  . Depression Mother        bipolar/schizo  . Heart disease Mother   . Diabetes Mother   . Asthma Father   . Hyperlipidemia Father   . Depression Maternal Grandmother   . Heart disease Maternal Grandmother   . Diabetes Maternal Grandmother   . Hypertension Maternal Grandfather   . Breast cancer Paternal Aunt     Social History   Tobacco Use  . Smoking status: Current Every Day Smoker    Packs/day: 0.50    Years: 31.00    Pack years: 15.50    Types: Cigarettes  .  Smokeless tobacco: Never Used  Vaping Use  . Vaping Use: Never used  Substance Use Topics  . Alcohol use: No    Alcohol/week: 0.0 standard drinks  . Drug use: Yes    Frequency: 1.0 times per week    Types: Marijuana, Cocaine    Comment: MJ twice a month. no coke in since 2010  Patient smokes 1 pack/day  Home Medications Prior to Admission medications   Medication Sig Start Date End Date Taking? Authorizing Provider  potassium chloride SA (KLOR-CON) 20 MEQ tablet Take 1 tablet (20 mEq total) by mouth 2 (two) times daily. 08/26/20  Yes Devoria Albe, MD  predniSONE (DELTASONE) 20 MG tablet Take 3 po QD x 3d , then 2 po QD x 3d then 1 po QD x 3d 08/26/20  Yes Devoria Albe, MD  albuterol (VENTOLIN HFA) 108 (90 Base) MCG/ACT inhaler Inhale 2 puffs into the lungs every 6 (six) hours as needed for wheezing or shortness of breath. Patient not taking: No sig reported 10/03/19   Jacquelin Hawking, PA-C  clindamycin (CLEOCIN) 150 MG capsule Take 1 capsule (150 mg total) by mouth  every 6 (six) hours. Patient not taking: No sig reported 10/22/19   Ronnie Doss A, PA-C  diclofenac Sodium (VOLTAREN) 1 % GEL 4 g 4 (four) times daily.    [provider]  FLUoxetine (PROZAC) 20 MG capsule Take 1 capsule (20 mg total) by mouth daily. Patient not taking: No sig reported 05/24/19   Jacquelin Hawking, PA-C  HYDROcodone-acetaminophen (NORCO/VICODIN) 5-325 MG tablet Take 1 tablet by mouth every 6 (six) hours as needed. 08/05/20   Bethann Berkshire, MD  ketoprofen (ORUDIS) 50 MG capsule Take 1 capsule (50 mg total) by mouth every 6 (six) hours as needed. 08/05/20   Bethann Berkshire, MD  meloxicam (MOBIC) 15 MG tablet Take 1 tablet (15 mg total) by mouth daily. Take with food. Patient not taking: No sig reported 06/19/19   Lenda Kelp, MD  Denies taking any meds  Allergies    Penicillins  Review of Systems   Review of Systems  All other systems reviewed and are negative.   Physical Exam Updated Vital Signs BP 136/88   Pulse 80   Temp 98.3 F (36.8 C) (Oral)   Resp 20   LMP 05/06/2016   SpO2 99%   Physical Exam Vitals and nursing note reviewed.  Constitutional:      General: She is not in acute distress.    Appearance: Normal appearance. She is obese. She is not ill-appearing or toxic-appearing.  HENT:     Head: Normocephalic and atraumatic.     Right Ear: External ear normal.     Left Ear: External ear normal.  Eyes:     Extraocular Movements: Extraocular movements intact.     Conjunctiva/sclera: Conjunctivae normal.     Pupils: Pupils are equal, round, and reactive to light.  Cardiovascular:     Rate and Rhythm: Normal rate and regular rhythm.     Pulses: Normal pulses.     Heart sounds: Normal heart sounds.  Pulmonary:     Effort: No respiratory distress.     Breath sounds: No stridor. No wheezing, rhonchi or rales.  Musculoskeletal:     Cervical back: Normal range of motion.     Right lower leg: No edema.     Left lower leg: No edema.  Skin:     General: Skin is warm and dry.  Neurological:     General: No focal  deficit present.     Mental Status: She is alert and oriented to person, place, and time.     Cranial Nerves: No cranial nerve deficit.  Psychiatric:        Mood and Affect: Mood normal.        Behavior: Behavior normal.        Thought Content: Thought content normal.     ED Results / Procedures / Treatments   Labs (all labs ordered are listed, but only abnormal results are displayed)  Results for orders placed or performed during the hospital encounter of 08/25/20  CBC with Differential  Result Value Ref Range   WBC 9.1 4.0 - 10.5 K/uL   RBC 4.36 3.87 - 5.11 MIL/uL   Hemoglobin 12.8 12.0 - 15.0 g/dL   HCT 81.140.2 91.436.0 - 78.246.0 %   MCV 92.2 80.0 - 100.0 fL   MCH 29.4 26.0 - 34.0 pg   MCHC 31.8 30.0 - 36.0 g/dL   RDW 95.613.8 21.311.5 - 08.615.5 %   Platelets 244 150 - 400 K/uL   nRBC 0.0 0.0 - 0.2 %   Neutrophils Relative % 56 %   Neutro Abs 5.2 1.7 - 7.7 K/uL   Lymphocytes Relative 35 %   Lymphs Abs 3.2 0.7 - 4.0 K/uL   Monocytes Relative 5 %   Monocytes Absolute 0.5 0.1 - 1.0 K/uL   Eosinophils Relative 3 %   Eosinophils Absolute 0.3 0.0 - 0.5 K/uL   Basophils Relative 1 %   Basophils Absolute 0.1 0.0 - 0.1 K/uL   Immature Granulocytes 0 %   Abs Immature Granulocytes 0.02 0.00 - 0.07 K/uL  Brain natriuretic peptide  Result Value Ref Range   B Natriuretic Peptide 37.0 0.0 - 100.0 pg/mL  D-dimer, quantitative  Result Value Ref Range   D-Dimer, Quant <0.27 0.00 - 0.50 ug/mL-FEU  Comprehensive metabolic panel  Result Value Ref Range   Sodium 138 135 - 145 mmol/L   Potassium 3.0 (L) 3.5 - 5.1 mmol/L   Chloride 104 98 - 111 mmol/L   CO2 24 22 - 32 mmol/L   Glucose, Bld 137 (H) 70 - 99 mg/dL   BUN 7 6 - 20 mg/dL   Creatinine, Ser 5.780.83 0.44 - 1.00 mg/dL   Calcium 9.3 8.9 - 46.910.3 mg/dL   Total Protein 7.1 6.5 - 8.1 g/dL   Albumin 3.3 (L) 3.5 - 5.0 g/dL   AST 16 15 - 41 U/L   ALT 16 0 - 44 U/L   Alkaline  Phosphatase 68 38 - 126 U/L   Total Bilirubin 0.4 0.3 - 1.2 mg/dL   GFR, Estimated >62>60 >95>60 mL/min   Anion gap 10 5 - 15   Laboratory interpretation all normal except hypokalemia, mild nonfasting hyperglycemia  Patient had negative COVID and influenza a and B done on January 5    EKG None  Radiology DG Chest Portable 1 View  Result Date: 08/25/2020 CLINICAL DATA:  Chest pain and shortness of breath. EXAM: PORTABLE CHEST 1 VIEW COMPARISON:  Most recent radiograph 03/16/2018 FINDINGS: Low lung volumes. Borderline cardiomegaly. Unchanged mediastinal contours. There are ill-defined patchy opacities in both lungs. Mild background peribronchial thickening. No pleural fluid or pneumothorax. Thoracic spondylosis. No acute osseous abnormalities are seen. IMPRESSION: 1. Low lung volumes. Ill-defined patchy opacities in both lungs may be atelectasis or pneumonia. 2. Background peribronchial thickening. Electronically Signed   By: Narda RutherfordMelanie  Sanford M.D.   On: 08/25/2020 22:37    Procedures Procedures (including critical care  time)  Medications Ordered in ED Medications  potassium chloride SA (KLOR-CON) CR tablet 40 mEq (has no administration in time range)  albuterol (VENTOLIN HFA) 108 (90 Base) MCG/ACT inhaler 2 puff (2 puffs Inhalation Given 08/26/20 0247)  aerochamber Z-Stat Plus/medium 1 each (1 each Other Given 08/26/20 0248)  predniSONE (DELTASONE) tablet 60 mg (60 mg Oral Given 08/26/20 0247)    ED Course  I have reviewed the triage vital signs and the nursing notes.  Pertinent labs & imaging results that were available during my care of the patient were reviewed by me and considered in my medical decision making (see chart for details).    MDM Rules/Calculators/A&P                         Patient remains afebrile in the emergency department.  On my exam she had no wheezing at all.  She states she only has the wheezing when she exerts herself.  This seems a little atypical, laboratory  testing was done to look for underlying congestive heart failure and PE.  Her chest x-ray was questionable for pneumonia.  She denies having fever at home.  When she was seen in the ED on January 5 her temperature was 99.  Patient remains afebrile during this ED visit.  She was given albuterol inhaler and spacer to use and was started on prednisone 60 mg orally.  Patient's labs are all normal, her BNP and D-dimer are normal.  She was discharged home.  Final Clinical Impression(s) / ED Diagnoses Final diagnoses:  Mild intermittent asthma with exacerbation  Hypokalemia    Rx / DC Orders ED Discharge Orders         Ordered    potassium chloride SA (KLOR-CON) 20 MEQ tablet  2 times daily        08/26/20 0253    predniSONE (DELTASONE) 20 MG tablet        08/26/20 0253         Plan discharge  Devoria Albe, MD, Concha Pyo, MD 08/26/20 3646    Devoria Albe, MD 08/26/20 [phone]

## 2020-08-26 NOTE — Discharge Instructions (Addendum)
Use the inhaler 2 puffs every 4-6 hours as needed for wheezing or shortness of breath.  Recheck if you get a high fever, you start coughing up a lot of yellow or green mucus or you struggle to breathe despite using your inhaler.  Take the potassium pills until gone and take the prednisone as prescribed.While you are on the prednisone you need to be very strict about watching your diabetic diet.  The prednisone can make your blood sugar get very elevated.

## 2020-08-26 NOTE — Congregational Nurse Program (Signed)
Client into [facility] today to renew [facility] . Client has not been seen by [facility] in about 1 year.  Client was seen in ER and needs help with medications at [facility]. Prednisone and potassium. Called [facility] to confirm prescriptions and cost. Total cost 12.65$ Two 10$ [facility] gift cards given to help with medications. No other medications sent by ER. Discussed getting [facility] to [facility] care and the importance of medical follow ups to ensure her medications and wellness.  Appointment secured for 08/28/20 at 8:15 am and client agreeable. Appointment card given.  Living arrangements. Client previously applied for housing with this RN for [employer] housing authority. She was living at that time some in her car and some with a daughter and father and she states was told that was not considered Homeless at the time.  Client reports she is now staying in a camper only at night and it has no running water and having to use a kerosine heater that aggravates her asthma. RN called and left a message with housing authority to return my call to update application with living situation. I also discussed the option of using the Homeless shelter while she updates her application and she would have access to a shower, hot food and heat. Client chooses to think on this. Will plan to follow up with housing authority and if new application is needed will plan to meet client at her medical appointment. Client is agreeable.    Francee Nodal RN [facility]

## 2020-08-28 ENCOUNTER — Ambulatory Visit: Payer: Self-pay | Admitting: Physician Assistant

## 2020-09-03 ENCOUNTER — Ambulatory Visit: Payer: Self-pay | Admitting: Physician Assistant

## 2020-09-04 ENCOUNTER — Encounter: Payer: Self-pay | Admitting: Physician Assistant

## 2020-09-09 ENCOUNTER — Ambulatory Visit: Payer: Self-pay | Admitting: Physician Assistant

## 2020-09-09 ENCOUNTER — Encounter: Payer: Self-pay | Admitting: Physician Assistant

## 2020-09-09 VITALS — BP 96/66 | HR 64 | Temp 97.5°F | Ht 65.75 in | Wt 330.0 lb

## 2020-09-09 DIAGNOSIS — J449 Chronic obstructive pulmonary disease, unspecified: Secondary | ICD-10-CM

## 2020-09-09 DIAGNOSIS — Z7689 Persons encountering health services in other specified circumstances: Secondary | ICD-10-CM

## 2020-09-09 DIAGNOSIS — E785 Hyperlipidemia, unspecified: Secondary | ICD-10-CM

## 2020-09-09 DIAGNOSIS — Z1239 Encounter for other screening for malignant neoplasm of breast: Secondary | ICD-10-CM

## 2020-09-09 DIAGNOSIS — G8929 Other chronic pain: Secondary | ICD-10-CM

## 2020-09-09 DIAGNOSIS — E876 Hypokalemia: Secondary | ICD-10-CM

## 2020-09-09 DIAGNOSIS — R7303 Prediabetes: Secondary | ICD-10-CM

## 2020-09-09 DIAGNOSIS — M25562 Pain in left knee: Secondary | ICD-10-CM

## 2020-09-09 DIAGNOSIS — F17200 Nicotine dependence, unspecified, uncomplicated: Secondary | ICD-10-CM

## 2020-09-09 DIAGNOSIS — F32A Depression, unspecified: Secondary | ICD-10-CM

## 2020-09-09 MED ORDER — ALBUTEROL SULFATE HFA 108 (90 BASE) MCG/ACT IN AERS: 2.0000 | INHALATION_SPRAY | Freq: Four times a day (QID) | RESPIRATORY_TRACT | 0 refills | Status: DC | PRN

## 2020-09-09 NOTE — Progress Notes (Signed)
BP 96/66   Pulse 64   Temp (!) 97.5 F (36.4 C)   Ht 5' 5.75" (1.67 m)   Wt (!) 330 lb (149.7 kg)   LMP 05/06/2016   SpO2 98%   BMI 53.67 kg/m    Subjective:    [name], female    DOB: 30-Jul-1971, 50 y.o.   MRN: [id number]  [name] is a 50 y.o. female presenting on 09/09/2020 for New Patient (Initial Visit) (Pt is a returning patient. Pt was last seen 06/2019 and has not been anywhere else for primary care.)   HPI    Pt had a negative covid 19 screening questionnaire.   Chief Complaint  Patient presents with  . New Patient (Initial Visit)    Pt is a returning patient. Pt was last seen 06/2019 and has not been anywhere else for primary care.      Pt says her primary reason for returning to clinic is to get prescriptions for inhaler and pain meds.  She says she "needs the hydrocodone for her pain".   Pt was Last seen in person 10/06/18.  Her appointment 07/17/19 was a virtual appointment.    She is still smoking but says she has cut back.  She says her depression is "horrible".  She is not having SI, HI.   She says the prozac she was taking didn't help her.  She never contacted [facility] as recommended.    She says she hasn't seen orthopedist in a long time.  She was going there for her knees.  She was seeing Dr Pearletha Forge.    She got 1st covid vaccination shot but hasn't gotten her 2nd dose.  Pt had some low K+ recently she says was due to GI issues.  She says her stomach is better now.    She is still smoking.       Relevant past medical, surgical, family and social history reviewed and updated as indicated. Interim medical history since our last visit reviewed. Allergies and medications reviewed and updated.   Current Outpatient Medications:  .  albuterol (VENTOLIN HFA) 108 (90 Base) MCG/ACT inhaler, Inhale 2 puffs into the lungs every 6 (six) hours as needed for wheezing or shortness of breath., Disp: 8 g, Rfl: 1   Review  of Systems  Per HPI unless specifically indicated above     Objective:    BP 96/66   Pulse 64   Temp (!) 97.5 F (36.4 C)   Ht 5' 5.75" (1.67 m)   Wt (!) 330 lb (149.7 kg)   LMP 05/06/2016   SpO2 98%   BMI 53.67 kg/m   Wt Readings from Last 3 Encounters:  09/09/20 (!) 330 lb (149.7 kg)  08/05/20 280 lb (127 kg)  10/22/19 280 lb (127 kg)    Physical Exam Vitals reviewed.  Constitutional:      General: She is not in acute distress.    Appearance: She is well-developed and well-nourished. She is obese. She is not toxic-appearing.  HENT:     Head: Normocephalic and atraumatic.     Mouth/Throat:     Mouth: Oropharynx is clear and moist.  Eyes:     Extraocular Movements: EOM normal.     Conjunctiva/sclera: Conjunctivae normal.     Pupils: Pupils are equal, round, and reactive to light.  Neck:     Thyroid: No thyromegaly.  Cardiovascular:     Rate and Rhythm: Normal rate and regular rhythm.  Pulmonary:  Effort: Pulmonary effort is normal.     Breath sounds: Normal breath sounds.  Abdominal:     General: Bowel sounds are normal.     Palpations: Abdomen is soft. There is no hepatosplenomegaly or mass.     Tenderness: There is no abdominal tenderness.  Musculoskeletal:        General: No edema.     Cervical back: Neck supple.     Right lower leg: No edema.     Left lower leg: No edema.  Lymphadenopathy:     Cervical: No cervical adenopathy.  Skin:    General: Skin is warm and dry.  Neurological:     Mental Status: She is alert and oriented to person, place, and time.     Motor: No weakness or tremor.     Gait: Gait normal.  Psychiatric:        Attention and Perception: Attention normal.        Mood and Affect: Mood and affect normal.        Speech: Speech normal.        Behavior: Behavior normal. Behavior is cooperative.           Assessment & Plan:    Encounter Diagnoses  Name Primary?  . Encounter to establish care Yes  . Chronic pain of both  knees   . Chronic obstructive pulmonary disease, unspecified COPD type (HCC)   . Tobacco use disorder   . Morbid obesity (HCC)   . Depression, unspecified depression type   . Prediabetes   . Hyperlipidemia, unspecified hyperlipidemia type   . Hypokalemia   . Encounter for screening for malignant neoplasm of breast, unspecified screening modality        -will Recheck labs- Cmp, a1c, lipids.  She will be called with results -rx for proair sent to [facility] -Refer back to orthopedics for her knees -she is given cafa/application for [facility] [employer] financial assistance -encouraged pt to contact [facility] for depression particularly in light of previous treatment -will refer for screening Mammogram -encouraged pt to get 2nd covid vaccination -pt to follow up  3 months.  She is to contact office sooner prn

## 2020-09-09 NOTE — Patient Instructions (Signed)
covid vaccinations:   local pharmacy or [facility]  [phone]).   [facility]-  [phone].

## 2020-09-11 ENCOUNTER — Ambulatory Visit: Payer: Self-pay | Admitting: Family Medicine

## 2020-09-12 ENCOUNTER — Ambulatory Visit: Payer: Self-pay

## 2020-09-17 ENCOUNTER — Other Ambulatory Visit: Payer: Self-pay | Admitting: Student

## 2020-09-17 DIAGNOSIS — Z1239 Encounter for other screening for malignant neoplasm of breast: Secondary | ICD-10-CM

## 2020-09-25 ENCOUNTER — Ambulatory Visit (HOSPITAL_COMMUNITY): Payer: Self-pay

## 2020-12-10 ENCOUNTER — Ambulatory Visit: Payer: Self-pay | Admitting: Physician Assistant

## 2020-12-23 ENCOUNTER — Encounter: Payer: Self-pay | Admitting: Physician Assistant

## 2021-01-15 DIAGNOSIS — Z419 Encounter for procedure for purposes other than remedying health state, unspecified: Secondary | ICD-10-CM | POA: Diagnosis not present

## 2021-01-16 ENCOUNTER — Ambulatory Visit: Payer: Self-pay | Admitting: Physician Assistant

## 2021-01-17 ENCOUNTER — Other Ambulatory Visit: Payer: Self-pay

## 2021-01-17 ENCOUNTER — Encounter (HOSPITAL_COMMUNITY): Payer: Self-pay | Admitting: Emergency Medicine

## 2021-01-17 ENCOUNTER — Emergency Department (HOSPITAL_COMMUNITY): Payer: Medicaid Other

## 2021-01-17 ENCOUNTER — Emergency Department (HOSPITAL_COMMUNITY)
Admission: EM | Admit: 2021-01-17 | Discharge: 2021-01-17 | Disposition: A | Discharge status: Home / Self Care | Payer: Medicaid Other | Attending: Emergency Medicine | Admitting: Emergency Medicine

## 2021-01-17 DIAGNOSIS — M25551 Pain in right hip: Secondary | ICD-10-CM | POA: Diagnosis not present

## 2021-01-17 DIAGNOSIS — E119 Type 2 diabetes mellitus without complications: Secondary | ICD-10-CM | POA: Insufficient documentation

## 2021-01-17 DIAGNOSIS — G8929 Other chronic pain: Secondary | ICD-10-CM | POA: Insufficient documentation

## 2021-01-17 DIAGNOSIS — M25561 Pain in right knee: Secondary | ICD-10-CM | POA: Diagnosis not present

## 2021-01-17 DIAGNOSIS — F17210 Nicotine dependence, cigarettes, uncomplicated: Secondary | ICD-10-CM | POA: Insufficient documentation

## 2021-01-17 DIAGNOSIS — J45909 Unspecified asthma, uncomplicated: Secondary | ICD-10-CM | POA: Diagnosis not present

## 2021-01-17 DIAGNOSIS — M1711 Unilateral primary osteoarthritis, right knee: Secondary | ICD-10-CM | POA: Diagnosis not present

## 2021-01-17 DIAGNOSIS — I10 Essential (primary) hypertension: Secondary | ICD-10-CM | POA: Diagnosis not present

## 2021-01-17 MED ORDER — NAPROXEN 500 MG PO TABS: 500.0000 mg | ORAL_TABLET | Freq: Two times a day (BID) | ORAL | 0 refills | Status: DC

## 2021-01-17 MED ORDER — KETOROLAC TROMETHAMINE 60 MG/2ML IM SOLN
60.0000 mg | Freq: Once | INTRAMUSCULAR | Status: AC
  Administered 2021-01-17: 60 mg via INTRAMUSCULAR
  Filled 2021-01-17: qty 2

## 2021-01-17 NOTE — Discharge Instructions (Addendum)
Take the medication as directed until its finished.  Please take with food.  Follow-up with your primary care provider for recheck or the orthopedic provider listed.

## 2021-01-20 NOTE — ED Provider Notes (Signed)
[facility] EMERGENCY DEPARTMENT Provider Note   CSN: [id number] Arrival date & time: 01/17/21  1012     History Chief Complaint  Patient presents with  . Knee Pain    Chronic right knee pain.  Here today for increasing pain in the last week.  Rates pain 9/10.      Sandra Orr is a 50 y.o. female.  HPI      Sandra Orr is a 50 y.o. female who presents to the Emergency Department complaining of recurrent right knee pain.  She reports history of ongoing pain to her right knee for some time, pain worse for one week. Now also having pain to her lateral right hip which is new for her.  Describes the pain in her hip as aching and burning.  States she has been told that she needs a knee replacement.  She has taken OTC medication without relief.  Denies new injury, fever, chills, rash or redness and swelling of her knee.    Past Medical History:  Diagnosis Date  . Arthritis   . Asthma   . Back pain   . Carpal tunnel syndrome   . Depression   . DM type 2 (diabetes mellitus, type 2) (HCC) 02/04/2015   A1c 6.5 (01/11/15)   . GERD (gastroesophageal reflux disease)   . Obesity     Patient Active Problem List   Diagnosis Date Noted  . Primary localized osteoarthritis of knees, bilateral 12/22/2017  . Chronic pain of left knee 08/03/2016  . 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 Abdominal pain 10/18/2015  . Constipation 10/18/2015  . Rash and nonspecific skin eruption 10/18/2015  . Bilateral knee pain 07/15/2015  . Carpal tunnel syndrome 07/15/2015  . Homelessness 05/30/2015  . Essential hypertension, benign 05/30/2015  . Morbid obesity (HCC) 05/04/2012  . Depression 05/04/2012  . Chronic pain 05/04/2012  . Tobacco abuse 05/04/2012    Past Surgical History:  Procedure Laterality Date  . CHOLECYSTECTOMY    . TUBAL LIGATION       OB History    Gravida  3   Para  3   Term  3   Preterm      AB      Living  3     SAB      IAB      Ectopic       Multiple      Live Births  3           Family History  Problem Relation Age of Onset  . Depression Mother        bipolar/schizo  . Heart disease Mother   . Diabetes Mother   . Asthma Father   . Hyperlipidemia Father   . Depression Maternal Grandmother   . Heart disease Maternal Grandmother   . Diabetes Maternal Grandmother   . Hypertension Maternal Grandfather   . Breast cancer Paternal Aunt     Social History   Tobacco Use  . Smoking status: Current Every Day Smoker    Packs/day: 0.25    Years: 31.00    Pack years: 7.75    Types: Cigarettes  . Smokeless tobacco: Never Used  Vaping Use  . Vaping Use: Never used  Substance Use Topics  . Alcohol use: No    Alcohol/week: 0.0 standard drinks  . Drug use: Yes    Frequency: 1.0 times per week    Types: Marijuana, Cocaine    Comment: MJ twice a month. no coke in  since 2010    Home Medications Prior to Admission medications   Medication Sig Start Date End Date Taking? Authorizing Provider  naproxen (NAPROSYN) 500 MG tablet Take 1 tablet (500 mg total) by mouth 2 (two) times daily with a meal. 01/17/21  Yes Miyoshi Ligas, PA-C  albuterol (VENTOLIN HFA) 108 (90 Base) MCG/ACT inhaler Inhale 2 puffs into the lungs every 6 (six) hours as needed for wheezing or shortness of breath. 09/09/20   Jacquelin Hawking, PA-C    Allergies    Penicillins  Review of Systems   Review of Systems  Constitutional: Negative for chills, fatigue and fever.  Respiratory: Negative for shortness of breath.   Cardiovascular: Negative for chest pain.  Gastrointestinal: Negative for abdominal pain, nausea and vomiting.  Genitourinary: Negative for dysuria, flank pain and hematuria.  Musculoskeletal: Positive for arthralgias (right knee pain, right hip pain). Negative for back pain.  Skin: Negative for rash.  Neurological: Negative for dizziness, weakness and numbness.  Hematological: Does not bruise/bleed easily.    Physical Exam Updated  Vital Signs BP 117/61 (BP Location: Left Arm)   Pulse (!) 52   Temp 97.9 F (36.6 C) (Oral)   Resp 18   Ht 5\' 8"  (1.727 m)   Wt 127 kg   LMP 05/06/2016   SpO2 98%   BMI 42.57 kg/m   Physical Exam Constitutional:      Appearance: Normal appearance. She is obese. She is not ill-appearing or toxic-appearing.  HENT:     Head: Normocephalic.  Cardiovascular:     Rate and Rhythm: Normal rate and regular rhythm.     Pulses: Normal pulses.  Pulmonary:     Effort: Pulmonary effort is normal.     Breath sounds: Normal breath sounds. No wheezing.  Abdominal:     Palpations: Abdomen is soft.     Tenderness: There is no abdominal tenderness. There is no guarding or rebound.  Musculoskeletal:        General: Tenderness present. No swelling, deformity or signs of injury. Normal range of motion.     Right lower leg: No edema.     Comments: Tenderness through ROM of the right knee and hip.  No effusion, erythema or excessive warmth.  Exam of the knee limited due to body habitus.    Skin:    General: Skin is warm.     Capillary Refill: Capillary refill takes less than 2 seconds.     Findings: No erythema or rash.  Neurological:     General: No focal deficit present.     Mental Status: She is alert.     Sensory: No sensory deficit.     Motor: No weakness.     ED Results / Procedures / Treatments   Labs (all labs ordered are listed, but only abnormal results are displayed) Labs Reviewed - No data to display  EKG None  Radiology DG Knee Complete 4 Views Right  Result Date: 01/17/2021 CLINICAL DATA:  Chronic RIGHT hip and knee pain, no injury EXAM: RIGHT KNEE - COMPLETE 4+ VIEW COMPARISON:  08/30/2018 FINDINGS: Osseous mineralization low normal. Joint space narrowing and spur formation at medial compartment, less at patellofemoral joint. No acute fracture, dislocation, or bone destruction. No joint effusion. IMPRESSION: Osteoarthritic changes RIGHT knee greatest at medial compartment.  Electronically Signed   By: 09/01/2018 M.D.   On: 01/17/2021 11:43   DG Hip Unilat W or Wo Pelvis 2-3 Views Right  Result Date: 01/17/2021 CLINICAL DATA:  Chronic RIGHT  hip and knee pain EXAM: DG HIP (WITH OR WITHOUT PELVIS) 2-3V RIGHT COMPARISON:  None FINDINGS: Osseous mineralization normal. Hip and SI joint spaces preserved. No fracture, dislocation, or bone destruction. IMPRESSION: Normal exam. Electronically Signed   By: Ulyses Southward M.D.   On: 01/17/2021 11:44     Procedures Procedures   Medications Ordered in ED Medications  ketorolac (TORADOL) injection 60 mg (60 mg Intramuscular Given 01/17/21 1118)    ED Course  I have reviewed the triage vital signs and the nursing notes.  Pertinent labs & imaging results that were available during my care of the patient were reviewed by me and considered in my medical decision making (see chart for details).    MDM Rules/Calculators/A&P                          Pt here with acute on chronic right knee pain.  No concerning sx's to suggest septic joint.  NV intact.  XR's without acute findings.  Pain addressed here.  Doubt emergent process.  Given f/u info for orthopedics.  Pt appears appropriate for d/c home.    Final Clinical Impression(s) / ED Diagnoses Final diagnoses:  Chronic pain of right knee  Right hip pain    Rx / DC Orders ED Discharge Orders         Ordered    naproxen (NAPROSYN) 500 MG tablet  2 times daily with meals        01/17/21 1226           [place], Cameron, PA-C 01/20/21 1416    Long, Arlyss Repress, MD 01/20/21 1643

## 2021-02-14 DIAGNOSIS — Z419 Encounter for procedure for purposes other than remedying health state, unspecified: Secondary | ICD-10-CM | POA: Diagnosis not present

## 2021-03-09 ENCOUNTER — Emergency Department (HOSPITAL_COMMUNITY): Payer: Medicaid Other

## 2021-03-09 ENCOUNTER — Emergency Department (HOSPITAL_COMMUNITY)
Admission: EM | Admit: 2021-03-09 | Discharge: 2021-03-09 | Disposition: A | Discharge status: Home / Self Care | Payer: Medicaid Other | Attending: Emergency Medicine | Admitting: Emergency Medicine

## 2021-03-09 ENCOUNTER — Other Ambulatory Visit: Payer: Self-pay

## 2021-03-09 ENCOUNTER — Encounter (HOSPITAL_COMMUNITY): Payer: Self-pay | Admitting: *Deleted

## 2021-03-09 DIAGNOSIS — M5440 Lumbago with sciatica, unspecified side: Secondary | ICD-10-CM | POA: Insufficient documentation

## 2021-03-09 DIAGNOSIS — M25559 Pain in unspecified hip: Secondary | ICD-10-CM | POA: Diagnosis not present

## 2021-03-09 DIAGNOSIS — E119 Type 2 diabetes mellitus without complications: Secondary | ICD-10-CM | POA: Insufficient documentation

## 2021-03-09 DIAGNOSIS — F17210 Nicotine dependence, cigarettes, uncomplicated: Secondary | ICD-10-CM | POA: Diagnosis not present

## 2021-03-09 DIAGNOSIS — I10 Essential (primary) hypertension: Secondary | ICD-10-CM | POA: Insufficient documentation

## 2021-03-09 DIAGNOSIS — M5442 Lumbago with sciatica, left side: Secondary | ICD-10-CM | POA: Diagnosis not present

## 2021-03-09 DIAGNOSIS — R102 Pelvic and perineal pain: Secondary | ICD-10-CM | POA: Diagnosis not present

## 2021-03-09 DIAGNOSIS — J45909 Unspecified asthma, uncomplicated: Secondary | ICD-10-CM | POA: Diagnosis not present

## 2021-03-09 DIAGNOSIS — I878 Other specified disorders of veins: Secondary | ICD-10-CM | POA: Diagnosis not present

## 2021-03-09 DIAGNOSIS — M5441 Lumbago with sciatica, right side: Secondary | ICD-10-CM | POA: Diagnosis not present

## 2021-03-09 DIAGNOSIS — M545 Low back pain, unspecified: Secondary | ICD-10-CM | POA: Diagnosis not present

## 2021-03-09 MED ORDER — HYDROMORPHONE HCL 1 MG/ML IJ SOLN
1.0000 mg | Freq: Once | INTRAMUSCULAR | Status: AC
  Administered 2021-03-09: 1 mg via INTRAMUSCULAR
  Filled 2021-03-09: qty 1

## 2021-03-09 MED ORDER — OXYCODONE-ACETAMINOPHEN 5-325 MG PO TABS: 1.0000 | ORAL_TABLET | Freq: Four times a day (QID) | ORAL | 0 refills | Status: DC | PRN

## 2021-03-09 MED ORDER — PREDNISONE 20 MG PO TABS: ORAL_TABLET | ORAL | 0 refills | Status: DC

## 2021-03-09 NOTE — ED Provider Notes (Signed)
[facility] EMERGENCY DEPARTMENT Provider Note   CSN: [id number] Arrival date & time: 03/09/21  1522     History Chief Complaint  Patient presents with   Hand Pain    Sandra Orr is a 50 y.o. female.  Patient complains of low back pain.  She also has pain radiating to her leg  The history is provided by the patient and medical records. No language interpreter was used.  Back Pain Location:  Sacro-iliac joint Quality:  Aching Radiates to:  L posterior upper leg and R posterior upper leg Pain severity:  Moderate Pain is:  Worse during the day Onset quality:  Gradual Duration:  4 weeks Timing:  Constant Progression:  Worsening Chronicity:  New Context: not emotional stress   Relieved by:  Nothing Worsened by:  Ambulation and bending Ineffective treatments:  None tried Associated symptoms: no abdominal pain, no chest pain and no headaches       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   Abdominal pain 10/18/2015   Constipation 10/18/2015   Rash and nonspecific skin eruption 10/18/2015   Bilateral knee pain 07/15/2015   Carpal tunnel syndrome 07/15/2015   Homelessness 05/30/2015   Essential hypertension, benign 05/30/2015   Morbid obesity (HCC) 05/04/2012   Depression 05/04/2012   Chronic pain 05/04/2012   Tobacco abuse 05/04/2012    Past Surgical History:  Procedure Laterality Date   CHOLECYSTECTOMY     TUBAL LIGATION       OB History     Gravida  3   Para  3   Term  3   Preterm      AB      Living  3      SAB      IAB       Ectopic      Multiple      Live Births  3           Family History  Problem Relation Age of Onset   Depression Mother        bipolar/schizo   Heart disease Mother    Diabetes Mother    Asthma Father    Hyperlipidemia Father    Depression Maternal Grandmother    Heart disease Maternal Grandmother    Diabetes Maternal Grandmother    Hypertension Maternal Grandfather    Breast cancer Paternal Aunt     Social History   Tobacco Use   Smoking status: Every Day    Packs/day: 0.25    Years: 31.00    Pack years: 7.75    Types: Cigarettes   Smokeless tobacco: Never  Vaping Use   Vaping Use: Never used  Substance Use Topics   Alcohol use: No    Alcohol/week: 0.0 standard drinks   Drug use: Yes    Frequency: 1.0 times per week    Types: Marijuana, Cocaine    Comment: MJ twice a month. no coke in since 2010    Home Medications Prior to Admission medications  Medication Sig Start Date End Date Taking? Authorizing Provider  oxyCODONE-acetaminophen (PERCOCET) 5-325 MG tablet Take 1 tablet by mouth every 6 (six) hours as needed. 03/09/21  Yes Bethann Berkshire, MD  predniSONE (DELTASONE) 20 MG tablet 2 tabs po daily x 3 days 03/09/21  Yes Bethann Berkshire, MD  albuterol (VENTOLIN HFA) 108 (90 Base) MCG/ACT inhaler Inhale 2 puffs into the lungs every 6 (six) hours as needed for wheezing or shortness of breath. 09/09/20   Jacquelin Hawking, PA-C  naproxen (NAPROSYN) 500 MG tablet Take 1 tablet (500 mg total) by mouth 2 (two) times daily with a meal. 01/17/21   Triplett, Tammy, PA-C    Allergies    Penicillins  Review of Systems   Review of Systems  Constitutional:  Negative for appetite change and fatigue.  HENT:  Negative for congestion, ear discharge and sinus pressure.   Eyes:  Negative for discharge.  Respiratory:  Negative for cough.   Cardiovascular:  Negative for chest pain.  Gastrointestinal:  Negative for abdominal pain and diarrhea.  Genitourinary:  Negative for  frequency and hematuria.  Musculoskeletal:  Positive for back pain.  Skin:  Negative for rash.  Neurological:  Negative for seizures and headaches.  Psychiatric/Behavioral:  Negative for hallucinations.    Physical Exam Updated Vital Signs BP 128/63 (BP Location: Right Arm)   Pulse (!) 58   Temp 98.2 F (36.8 C) (Oral)   Resp 18   LMP 05/06/2016   SpO2 94%   Physical Exam Vitals and nursing note reviewed.  Constitutional:      Appearance: She is well-developed.  HENT:     Head: Normocephalic.  Eyes:     General: No scleral icterus.    Conjunctiva/sclera: Conjunctivae normal.  Neck:     Thyroid: No thyromegaly.  Cardiovascular:     Rate and Rhythm: Normal rate and regular rhythm.     Heart sounds: No murmur heard.   No friction rub. No gallop.  Pulmonary:     Breath sounds: No stridor. No wheezing or rales.  Chest:     Chest wall: No tenderness.  Abdominal:     General: There is no distension.     Tenderness: There is no abdominal tenderness. There is no rebound.  Musculoskeletal:        General: Normal range of motion.     Cervical back: Neck supple.     Comments: Tender lumbar spine negative straight leg raise  Lymphadenopathy:     Cervical: No cervical adenopathy.  Skin:    Findings: No erythema or rash.  Neurological:     Mental Status: She is alert and oriented to person, place, and time.     Motor: No abnormal muscle tone.     Coordination: Coordination normal.  Psychiatric:        Behavior: Behavior normal.    ED Results / Procedures / Treatments   Labs (all labs ordered are listed, but only abnormal results are displayed) Labs Reviewed - No data to display  EKG None  Radiology DG Lumbar Spine Complete  Result Date: 03/09/2021 CLINICAL DATA:  pain EXAM: LUMBAR SPINE - COMPLETE 4+ VIEW COMPARISON:  August 05, 2020 FINDINGS: There are five non-rib bearing lumbar-type vertebral bodies. There is normal alignment. There is no evidence for acute  fracture or subluxation. Mild intervertebral disc space height loss of L4-5, L3-4 and L2-3. Lower lumbar facet arthropathy. Mild multilevel endplate proliferative changes. Status post cholecystectomy. Atherosclerotic calcifications. IMPRESSION: Mild degenerative changes of the lumbar spine.  Electronically Signed   By: Meda Klinefelter MD   On: 03/09/2021 17:25   DG Pelvis 1-2 Views  Result Date: 03/09/2021 CLINICAL DATA:  pain EXAM: PELVIS - 1-2 VIEW COMPARISON:  January 17, 2021 FINDINGS: There is no evidence of pelvic fracture or diastasis. No pelvic bone lesions are seen. Pelvic phleboliths. IMPRESSION: Negative. Electronically Signed   By: Meda Klinefelter MD   On: 03/09/2021 17:26    Procedures Procedures   Medications Ordered in ED Medications  HYDROmorphone (DILAUDID) injection 1 mg (1 mg Intramuscular Given 03/09/21 1654)    ED Course  I have reviewed the triage vital signs and the nursing notes.  Pertinent labs & imaging results that were available during my care of the patient were reviewed by me and considered in my medical decision making (see chart for details).    MDM Rules/Calculators/A&P                           Patient with degenerative changes in her lumbar spine.  She is put on prednisone and Percocet and will follow up with Ortho Final Clinical Impression(s) / ED Diagnoses Final diagnoses:  Acute bilateral low back pain with sciatica, sciatica laterality unspecified    Rx / DC Orders ED Discharge Orders          Ordered    predniSONE (DELTASONE) 20 MG tablet        03/09/21 1917    oxyCODONE-acetaminophen (PERCOCET) 5-325 MG tablet  Every 6 hours PRN        03/09/21 1917             Bethann Berkshire, MD 03/10/21 1058

## 2021-03-09 NOTE — ED Notes (Signed)
ED Provider at bedside. 

## 2021-03-09 NOTE — ED Notes (Signed)
Patient transported to X-ray 

## 2021-03-09 NOTE — Discharge Instructions (Addendum)
Follow-up with Dr. Romeo Apple or one of his colleagues in the next 2 to 3 weeks

## 2021-03-09 NOTE — ED Triage Notes (Signed)
Pt c/o hand pain, knee pain and back pain; pt states she has been taking ibuprofen and tylenol with no relief

## 2021-03-17 ENCOUNTER — Ambulatory Visit: Payer: Medicaid Other | Admitting: Family Medicine

## 2021-03-26 ENCOUNTER — Ambulatory Visit: Payer: Medicaid Other | Admitting: Family Medicine

## 2021-04-04 ENCOUNTER — Telehealth: Payer: Self-pay

## 2021-04-04 NOTE — Telephone Encounter (Signed)
..   Medicaid Managed Care   Unsuccessful Outreach Note  04/04/2021 Name: Sandra Orr MRN: [id number] DOB: 06-04-1971  Referred by: Pcp, No Reason for referral : High Risk Managed Medicaid and Appointment (I called the patient today to offer her a phone visit with the [facility] Team. There was no answer and no VM.)   An unsuccessful telephone outreach was attempted today. The patient was referred to the case management team for assistance with care management and care coordination.   Follow Up Plan: The care management team will reach out to the patient again over the next 7-14 days.   Weston Settle Care Guide, High Risk Medicaid Managed Care Embedded Care Coordination [facility]  [facility]

## 2021-04-30 ENCOUNTER — Other Ambulatory Visit: Payer: Self-pay

## 2021-04-30 ENCOUNTER — Emergency Department (HOSPITAL_COMMUNITY)
Admission: EM | Admit: 2021-04-30 | Discharge: 2021-04-30 | Disposition: A | Discharge status: Home / Self Care | Payer: Medicaid Other | Attending: Emergency Medicine | Admitting: Emergency Medicine

## 2021-04-30 ENCOUNTER — Encounter (HOSPITAL_COMMUNITY): Payer: Self-pay

## 2021-04-30 DIAGNOSIS — F17210 Nicotine dependence, cigarettes, uncomplicated: Secondary | ICD-10-CM | POA: Insufficient documentation

## 2021-04-30 DIAGNOSIS — E119 Type 2 diabetes mellitus without complications: Secondary | ICD-10-CM | POA: Insufficient documentation

## 2021-04-30 DIAGNOSIS — M545 Low back pain, unspecified: Secondary | ICD-10-CM | POA: Diagnosis not present

## 2021-04-30 DIAGNOSIS — I10 Essential (primary) hypertension: Secondary | ICD-10-CM | POA: Insufficient documentation

## 2021-04-30 DIAGNOSIS — M5459 Other low back pain: Secondary | ICD-10-CM | POA: Diagnosis not present

## 2021-04-30 DIAGNOSIS — J45909 Unspecified asthma, uncomplicated: Secondary | ICD-10-CM | POA: Insufficient documentation

## 2021-04-30 MED ORDER — DICLOFENAC SODIUM 1 % EX GEL: 2.0000 g | Freq: Four times a day (QID) | CUTANEOUS | 0 refills | Status: DC | PRN

## 2021-04-30 MED ORDER — IBUPROFEN 600 MG PO TABS: 600.0000 mg | ORAL_TABLET | Freq: Three times a day (TID) | ORAL | 0 refills | Status: DC

## 2021-04-30 MED ORDER — KETOROLAC TROMETHAMINE 30 MG/ML IJ SOLN: 30.0000 mg | Freq: Once | INTRAMUSCULAR | 0 refills | Status: AC

## 2021-04-30 MED ORDER — KETOROLAC TROMETHAMINE 30 MG/ML IJ SOLN
30.0000 mg | Freq: Once | INTRAMUSCULAR | Status: AC
  Administered 2021-04-30: 30 mg via INTRAMUSCULAR
  Filled 2021-04-30: qty 1

## 2021-04-30 NOTE — ED Provider Notes (Signed)
Emergency Department Provider Note   I have reviewed the triage vital signs and the nursing notes.   HISTORY  Chief Complaint Back Pain   HPI Sandra Orr is a 50 y.o. female with past medical history reviewed below including chronic back pain presents to the emergency department with acute on chronic lower back pain.  She has both midline and bilateral lumbar spine discomfort.  No radiation of pain down the legs.  No numbness or weakness in the extremities.  No fevers.  No groin numbness.  No bowel or bladder incontinence.  No urinary retention symptoms.  She notes history of similar pain in the past but this is flaring.  She denies any injury.    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   Abdominal pain 10/18/2015   Constipation 10/18/2015   Rash and nonspecific skin eruption 10/18/2015   Bilateral knee pain 07/15/2015   Carpal tunnel syndrome 07/15/2015   Homelessness 05/30/2015   Essential hypertension, benign 05/30/2015   Morbid obesity (HCC) 05/04/2012   Depression 05/04/2012   Chronic pain 05/04/2012   Tobacco abuse 05/04/2012    Past Surgical History:  Procedure Laterality Date   CHOLECYSTECTOMY     TUBAL LIGATION      Allergies Penicillins  Family History  Problem Relation Age of Onset   Depression Mother        bipolar/schizo   Heart disease Mother    Diabetes Mother    Asthma Father    Hyperlipidemia Father    Depression Maternal Grandmother    Heart disease Maternal Grandmother    Diabetes Maternal Grandmother    Hypertension  Maternal Grandfather    Breast cancer Paternal Aunt     Social History Social History   Tobacco Use   Smoking status: Every Day    Packs/day: 0.25    Years: 31.00    Pack years: 7.75    Types: Cigarettes   Smokeless tobacco: Never  Vaping Use   Vaping Use: Never used  Substance Use Topics   Alcohol use: No    Alcohol/week: 0.0 standard drinks   Drug use: Yes    Frequency: 1.0 times per week    Types: Marijuana, Cocaine    Comment: MJ twice a month. no coke in since 2010    Review of Systems  Constitutional: No fever/chills Eyes: No visual changes. ENT: No sore throat. Cardiovascular: Denies chest pain. Respiratory: Denies shortness of breath. Gastrointestinal: No abdominal pain.  No nausea, no vomiting.  No diarrhea.  No constipation. Genitourinary: Negative for dysuria. Musculoskeletal: Positive for back pain. Skin: Negative for rash. Neurological: Negative for headaches, focal weakness or numbness.  10-point ROS otherwise negative.  ____________________________________________   PHYSICAL EXAM:  VITAL SIGNS: ED Triage Vitals  Enc Vitals Group     BP 04/30/21 0721 128/64     Pulse Rate 04/30/21 0721 64     Resp 04/30/21 0721 19     Temp 04/30/21 0721 98.2 F (36.8 C)  Temp Source 04/30/21 0721 Oral     SpO2 04/30/21 0721 100 %     Weight 04/30/21 0719 250 lb (113.4 kg)     Height 04/30/21 0719 5\' 4"  (1.626 m)    Constitutional: Alert and oriented. Well appearing and in no acute distress. Eyes: Conjunctivae are normal.  Head: Atraumatic. Nose: No congestion/rhinnorhea. Mouth/Throat: Mucous membranes are moist.  Neck: No stridor.  Cardiovascular: Normal rate, regular rhythm. Good peripheral circulation. Grossly normal heart sounds.   Respiratory: Normal respiratory effort.  No retractions. Lungs CTAB. Gastrointestinal: Soft and nontender. No distention.  Musculoskeletal: No lower extremity tenderness nor edema. No gross deformities of  extremities. Neurologic:  Normal speech and language. Normal strength and sensation in the bilateral upper/lower extremities. No numbness. 2+ patellar reflexes.  Skin:  Skin is warm, dry and intact. No rash noted.  ____________________________________________  RADIOLOGY  None   Reviewed plain films from 02/2021  ____________________________________________   PROCEDURES  Procedure(s) performed:   Procedures  None  ____________________________________________   INITIAL IMPRESSION / ASSESSMENT AND PLAN / ED COURSE  Pertinent labs & imaging results that were available during my care of the patient were reviewed by me and considered in my medical decision making (see chart for details).   Patient presents emergency department valuation of lower back pain.  Is an acute on chronic issue with no red flag signs or symptoms to suspect acute spine emergency.   Differential diagnosis includes but is not exclusive to musculoskeletal back pain, renal colic, urinary tract infection, pyelonephritis, intra-abdominal causes of back pain, aortic aneurysm or dissection, cauda equina syndrome, sciatica, lumbar disc disease, thoracic disc disease, etc.  Patient's exam is reassuring.  Plan for Toradol IM and anti-inflammatory medications.  Patient tells me that she has had relief with Percocet and Vicodin in the past.  In review of the 03/2021 drug database this was last prescribed in July.  I discussed that I am concerned with providing additional opiate prescriptions from the emergency department for what appears to be chronic pain.  Her imaging from July does not show any bony lesions or other acute findings.  I am not having signs or symptoms on exam or history to prompt more advanced, emergent imaging of the spine.   Will give contact information for the spine specialist on-call.  [facility] and will [facility] to do so.  Discussed ED return  precautions.  ____________________________________________  FINAL CLINICAL IMPRESSION(S) / ED DIAGNOSES  Final diagnoses:  Acute bilateral low back pain without sciatica     MEDICATIONS GIVEN DURING THIS VISIT:  Medications  ketorolac (TORADOL) 30 MG/ML injection 30 mg (has no administration in time range)     NEW OUTPATIENT MEDICATIONS STARTED DURING THIS VISIT:  New Prescriptions   DICLOFENAC SODIUM (VOLTAREN) 1 % GEL    Apply 2 g topically 4 (four) times daily as needed.   IBUPROFEN (ADVIL) 600 MG TABLET    Take 1 tablet (600 mg total) by mouth 3 (three) times daily.   KETOROLAC (TORADOL) 30 MG/ML INJECTION    Inject 1 mL (30 mg total) into the muscle once for 1 dose.    Note:  This document was prepared using Dragon voice recognition software and may include unintentional dictation errors.  August, MD, [facility]    Sandra Orr, [facility], MD 04/30/21 [phone]

## 2021-04-30 NOTE — ED Triage Notes (Signed)
Pt presents to ED with complaints of lower back pain.

## 2021-04-30 NOTE — Discharge Instructions (Signed)

## 2021-05-01 ENCOUNTER — Telehealth: Payer: Self-pay

## 2021-05-01 NOTE — Telephone Encounter (Signed)
Transition Care Management Follow-up Telephone Call Date of discharge and from where: 04/30/2021-[place]  How have you been since you were released from the hospital? Patient stated she is doing fine,medication helped a lot  Any questions or concerns? No  Items Reviewed: Did the pt receive and understand the discharge instructions provided? Yes  Medications obtained and verified? Yes  Other? No  Any new allergies since your discharge? No  Dietary orders reviewed? N/A Do you have support at home? Yes   Home Care and Equipment/Supplies: Were home health services ordered? not applicable If so, what is the name of the agency? N/A  Has the agency set up a time to come to the patient's home? not applicable Were any new equipment or medical supplies ordered?  No What is the name of the medical supply agency? N/A Were you able to get the supplies/equipment? not applicable Do you have any questions related to the use of the equipment or supplies? No  Functional Questionnaire: (I = Independent and D = Dependent) ADLs: I  Bathing/Dressing- I  Meal Prep- I  Eating- I  Maintaining continence- I  Transferring/Ambulation- I  Managing Meds- I  Follow up appointments reviewed:  PCP Hospital f/u appt confirmed? No   Specialist Hospital f/u appt confirmed? No   Are transportation arrangements needed? No  If their condition worsens, is the pt aware to call PCP or go to the Emergency Dept.? Yes Was the patient provided with contact information for the PCP's office or ED? Yes Was to pt encouraged to call back with questions or concerns? Yes

## 2021-06-06 ENCOUNTER — Telehealth: Payer: Self-pay

## 2021-06-06 NOTE — Telephone Encounter (Signed)
..   Medicaid Managed Care   Unsuccessful Outreach Note  06/06/2021 Name: Sandra Orr MRN: [id number] DOB: 03/25/1971  Referred by: Pcp, No Reason for referral : High Risk Managed Medicaid (I called the patient today to get her scheduled with the [facility] Team. I left my name and number on her VM.)   A second unsuccessful telephone outreach was attempted today. The patient was referred to the case management team for assistance with care management and care coordination.   Follow Up Plan: The care management team will reach out to the patient again over the next 7-14 days.   Weston Settle Care Guide, High Risk Medicaid Managed Care Embedded Care Coordination [facility]  [facility]

## 2021-06-09 ENCOUNTER — Ambulatory Visit (INDEPENDENT_AMBULATORY_CARE_PROVIDER_SITE_OTHER): Payer: Medicaid Other | Admitting: Family Medicine

## 2021-06-09 VITALS — BP 145/62 | Ht 66.5 in | Wt 311.0 lb

## 2021-06-09 DIAGNOSIS — M25562 Pain in left knee: Secondary | ICD-10-CM

## 2021-06-09 DIAGNOSIS — G5603 Carpal tunnel syndrome, bilateral upper limbs: Secondary | ICD-10-CM

## 2021-06-09 DIAGNOSIS — M170 Bilateral primary osteoarthritis of knee: Secondary | ICD-10-CM | POA: Diagnosis not present

## 2021-06-09 DIAGNOSIS — M25561 Pain in right knee: Secondary | ICD-10-CM

## 2021-06-09 DIAGNOSIS — G8929 Other chronic pain: Secondary | ICD-10-CM | POA: Diagnosis not present

## 2021-06-09 MED ORDER — METHYLPREDNISOLONE ACETATE 40 MG/ML IJ SUSP
40.0000 mg | Freq: Once | INTRAMUSCULAR | Status: AC
  Administered 2021-06-09: 40 mg via INTRA_ARTICULAR

## 2021-06-09 NOTE — Patient Instructions (Signed)
We repeated the injections in your knees for arthritis.  You had moderate carpal tunnel syndrome based on nerve conduction studies done 2 years ago. The concern is whether or not this can be helped by release given how long this has gone on and its severity. We will refer you to the hand surgeon to discuss, consider repeating the test but will leave that decision up to them. Wrist braces at nighttime.

## 2021-06-10 ENCOUNTER — Encounter: Payer: Self-pay | Admitting: Family Medicine

## 2021-06-10 NOTE — Progress Notes (Signed)
PCP: Pcp, No  Subjective:   HPI: Patient is a 50 y.o. female here for bilateral knee pain, wrist pain.  Patient has known history of bilateral knee arthritis. Typically gets injections that will last her a few months - last injections 09/2019. She is interested in repeating these today. She also has bilateral hand numbness (1st-4th digits) and was referred to hand surgery following NCVs showing moderate carpal tunnel - referral placed 06/2019 but she no-showed that appointment and hasn't followed up since that time. Wore wrist braces for over 6 months.  Past Medical History:  Diagnosis Date   Arthritis    Asthma    Back pain    Carpal tunnel syndrome    Depression    DM type 2 (diabetes mellitus, type 2) (HCC) 02/04/2015   A1c 6.5 (01/11/15)    GERD (gastroesophageal reflux disease)    Obesity     Current Outpatient Medications on File Prior to Visit  Medication Sig Dispense Refill   albuterol (VENTOLIN HFA) 108 (90 Base) MCG/ACT inhaler Inhale 2 puffs into the lungs every 6 (six) hours as needed for wheezing or shortness of breath. 3 each 0   diclofenac Sodium (VOLTAREN) 1 % GEL Apply 2 g topically 4 (four) times daily as needed. 100 g 0   ibuprofen (ADVIL) 600 MG tablet Take 1 tablet (600 mg total) by mouth 3 (three) times daily. 60 tablet 0   naproxen (NAPROSYN) 500 MG tablet Take 1 tablet (500 mg total) by mouth 2 (two) times daily with a meal. 14 tablet 0   oxyCODONE-acetaminophen (PERCOCET) 5-325 MG tablet Take 1 tablet by mouth every 6 (six) hours as needed. 20 tablet 0   predniSONE (DELTASONE) 20 MG tablet 2 tabs po daily x 3 days 6 tablet 0   No current facility-administered medications on file prior to visit.    Past Surgical History:  Procedure Laterality Date   CHOLECYSTECTOMY     TUBAL LIGATION      Allergies  Allergen Reactions   Penicillins Hives    Has patient had a PCN reaction causing immediate rash, facial/tongue/throat swelling, SOB or lightheadedness  with hypotension: No Has patient had a PCN reaction causing severe rash involving mucus membranes or skin necrosis: No Has patient had a PCN reaction that required hospitalization No Has patient had a PCN reaction occurring within the last 10 years: No If all of the above answers are "NO", then may proceed with Cephalosporin use.     BP (!) 145/62   Ht 5' 6.5" (1.689 m)   Wt (!) 311 lb (141.1 kg)   LMP 05/06/2016   BMI 49.44 kg/m   [facility] 06/09/2021  Frequency of aerobic exercise (# of days/week) 0  Average time in minutes 0  Frequency of strengthening activities (# of days/week) 0    No flowsheet data found.      Objective:  Physical Exam:  Gen: NAD, comfortable in exam room  Bilateral knees: No gross deformity, ecchymoses, swelling. TTP medial joint line, post patellar facets. FROM with normal strength. Negative ant/post drawers. Negative valgus/varus testing. Negative lachman.  Negative mcmurrays, apleys. NV intact distally.  Bilateral hands/wrists: No deformity, swelling, bruising, atrophy. FROM with 4/5 strength thumb opposition Tenderness to palpation over median nerve. Positive tinels, phalens.    Assessment & Plan:  1. Bilateral knee pain - 2/2 arthritis.  Injections repeated today as below.  After informed written consent timeout was performed, patient was seated on exam table. Right  knee was prepped with alcohol swab and utilizing anteromedial approach, patient's right knee was injected intraarticularly with 3:1 lidocaine: depomedrol. Patient tolerated the procedure well without immediate complications.  After informed written consent timeout was performed, patient was seated on exam table. Left knee was prepped with alcohol swab and utilizing anteromedial approach, patient's left knee was injected intraarticularly with 3:1 lidocaine: depomedrol. Patient tolerated the procedure well without immediate complications.  2.  Bilateral carpal tunnel syndrome - confirmed with NCVs that were done 09/2018.  Discussed two major concerns - that this may no longer be amenable to surgical intervention and that given this was 2.5 years ago surgeon will likely want updated test before discussion on if that is possible.  Also stressed importance that when she has appointment with them that she keeps this.  Wrist braces at nighttime.

## 2021-06-13 ENCOUNTER — Other Ambulatory Visit: Payer: Self-pay | Admitting: *Deleted

## 2021-06-13 NOTE — Patient Instructions (Signed)
Visit Information  Ms. [name]  - as a part of your Medicaid benefit, you are eligible for care management and care coordination services at no cost or copay. I was unable to reach you by phone today but would be happy to help you with your health related needs. Please feel free to call me @ [phone].   A member of the Managed Medicaid care management team will reach out to you again over the next 14 days.   Estanislado Emms RN, BSN [place]  [facility]

## 2021-06-13 NOTE — Patient Outreach (Signed)
Care Coordination  06/13/2021  [name] 12-20-1970 [id number]   Medicaid Managed Care   Unsuccessful Outreach Note  06/13/2021 Name: Sandra Orr MRN: [id number] DOB: February 16, 1971  Referred by: Pcp, No Reason for referral : High Risk Managed Medicaid (Unsuccessful RNCM initial outreach)   An unsuccessful telephone outreach was attempted today. The patient was referred to the case management team for assistance with care management and care coordination.   Follow Up Plan: A HIPAA compliant phone message was left for the patient providing contact information and requesting a return call.   Estanislado Emms RN, BSN [place]  [facility]

## 2021-06-18 ENCOUNTER — Other Ambulatory Visit: Payer: Self-pay | Admitting: *Deleted

## 2021-06-18 ENCOUNTER — Other Ambulatory Visit: Payer: Self-pay

## 2021-06-18 NOTE — Patient Outreach (Signed)
Care Coordination  06/18/2021  Sandra Orr 08/30/1970 [id number]  Successful outreach with [name] today. However, she was in the Emergency room and requested to reschedule today's visit. A new appointment was scheduled for 06/25/21 @ 11:15am. [name] agreed to the new date and time.  Estanislado Emms RN, BSN [place]  [facility]

## 2021-06-19 ENCOUNTER — Ambulatory Visit: Payer: Medicaid Other | Admitting: Orthopedic Surgery

## 2021-06-25 ENCOUNTER — Other Ambulatory Visit: Payer: Self-pay | Admitting: *Deleted

## 2021-06-25 NOTE — Patient Instructions (Signed)
Visit Information  [name]  - as a part of your Medicaid benefit, you are eligible for care management and care coordination services at no cost or copay. I was unable to reach you by phone today but would be happy to help you with your health related needs. Please feel free to call me @ [phone].   A member of the Managed Medicaid care management team will reach out to you again over the next 14 days.   Estanislado Emms RN, BSN [place]  [facility]

## 2021-06-25 NOTE — Patient Outreach (Signed)
Care Coordination  06/25/2021  Sandra Orr [birth year] [id number]   Medicaid Managed Care   Unsuccessful Outreach Note  06/25/2021 Name: Sandra Orr MRN: [id number] DOB: [birth year]  Referred by: Pcp, No Reason for referral : High Risk Managed Medicaid (Unsuccessful RNCM initial outreach, 2nd attempt)   A second unsuccessful telephone outreach was attempted today. The patient was referred to the case management team for assistance with care management and care coordination.   Follow Up Plan: A HIPAA compliant phone message was left for the patient providing contact information and requesting a return call.   Estanislado Emms RN, BSN [place]  [facility]

## 2021-07-04 ENCOUNTER — Other Ambulatory Visit: Payer: Self-pay | Admitting: *Deleted

## 2021-07-04 NOTE — Patient Outreach (Signed)
Care Coordination  07/04/2021  Sandra Orr [birth year] [id number]   Medicaid Managed Care   Unsuccessful Outreach Note  07/04/2021 Name: Sandra Orr MRN: [id number] DOB: Mar 06, 1971  Referred by: Pcp, No Reason for referral : High Risk Managed Medicaid (Unsuccessful RNCM initial outreach, 3rd attempt)   Third unsuccessful telephone outreach was attempted today. The patient was referred to the case management team for assistance with care management and care coordination. The patient's primary care provider has been notified of our unsuccessful attempts to make or maintain contact with the patient. The care management team is pleased to engage with this patient at any time in the future should he/she be interested in assistance from the care management team.   Follow Up Plan: We have been unable to make contact with the patient for follow up. The care management team is available to follow up with the patient after provider conversation with the patient regarding recommendation for care management engagement and subsequent re-referral to the care management team.  A HIPAA compliant phone message was left for the patient providing contact information and requesting a return call.   Estanislado Emms RN, BSN [place]  [facility]

## 2021-08-20 ENCOUNTER — Ambulatory Visit: Payer: Medicaid Other | Admitting: Family Medicine

## 2021-10-04 ENCOUNTER — Encounter (HOSPITAL_COMMUNITY): Payer: Self-pay

## 2021-10-04 ENCOUNTER — Other Ambulatory Visit: Payer: Self-pay

## 2021-10-04 ENCOUNTER — Emergency Department (HOSPITAL_COMMUNITY)
Admission: EM | Admit: 2021-10-04 | Discharge: 2021-10-06 | Disposition: A | Discharge status: Home / Self Care | Payer: Medicaid Other | Attending: Emergency Medicine | Admitting: Emergency Medicine

## 2021-10-04 DIAGNOSIS — R45851 Suicidal ideations: Secondary | ICD-10-CM | POA: Insufficient documentation

## 2021-10-04 DIAGNOSIS — Z720 Tobacco use: Secondary | ICD-10-CM | POA: Diagnosis present

## 2021-10-04 DIAGNOSIS — F1410 Cocaine abuse, uncomplicated: Secondary | ICD-10-CM

## 2021-10-04 DIAGNOSIS — F332 Major depressive disorder, recurrent severe without psychotic features: Secondary | ICD-10-CM | POA: Insufficient documentation

## 2021-10-04 DIAGNOSIS — F1420 Cocaine dependence, uncomplicated: Secondary | ICD-10-CM | POA: Diagnosis not present

## 2021-10-04 DIAGNOSIS — F1910 Other psychoactive substance abuse, uncomplicated: Secondary | ICD-10-CM

## 2021-10-04 DIAGNOSIS — F17200 Nicotine dependence, unspecified, uncomplicated: Secondary | ICD-10-CM | POA: Diagnosis not present

## 2021-10-04 DIAGNOSIS — J449 Chronic obstructive pulmonary disease, unspecified: Secondary | ICD-10-CM

## 2021-10-04 DIAGNOSIS — F32A Depression, unspecified: Secondary | ICD-10-CM | POA: Diagnosis present

## 2021-10-04 HISTORY — DX: Anxiety disorder, unspecified: F41.9

## 2021-10-04 LAB — CBC WITH DIFFERENTIAL/PLATELET
Abs Immature Granulocytes: 0.01 10*3/uL (ref 0.00–0.07)
Basophils Absolute: 0 10*3/uL (ref 0.0–0.1)
Basophils Relative: 1 %
Eosinophils Absolute: 0.2 10*3/uL (ref 0.0–0.5)
Eosinophils Relative: 3 %
HCT: 43.6 % (ref 36.0–46.0)
Hemoglobin: 13.9 g/dL (ref 12.0–15.0)
Immature Granulocytes: 0 %
Lymphocytes Relative: 35 %
Lymphs Abs: 2.4 10*3/uL (ref 0.7–4.0)
MCH: 30.4 pg (ref 26.0–34.0)
MCHC: 31.9 g/dL (ref 30.0–36.0)
MCV: 95.4 fL (ref 80.0–100.0)
Monocytes Absolute: 0.5 10*3/uL (ref 0.1–1.0)
Monocytes Relative: 7 %
Neutro Abs: 3.8 10*3/uL (ref 1.7–7.7)
Neutrophils Relative %: 54 %
Platelets: 222 10*3/uL (ref 150–400)
RBC: 4.57 MIL/uL (ref 3.87–5.11)
RDW: 13.3 % (ref 11.5–15.5)
WBC: 6.9 10*3/uL (ref 4.0–10.5)
nRBC: 0 % (ref 0.0–0.2)

## 2021-10-04 LAB — RAPID URINE DRUG SCREEN, HOSP PERFORMED
Amphetamines: NEGATIVE — AB
Barbiturates: NEGATIVE — AB
Benzodiazepines: NEGATIVE — AB
Cocaine: POSITIVE — AB
Opiates: NEGATIVE — AB
Tetrahydrocannabinol: POSITIVE — AB

## 2021-10-04 LAB — COMPREHENSIVE METABOLIC PANEL
ALT: 14 U/L (ref 0–44)
AST: 15 U/L (ref 15–41)
Albumin: 3.7 g/dL (ref 3.5–5.0)
Alkaline Phosphatase: 82 U/L (ref 38–126)
Anion gap: 8 (ref 5–15)
BUN: 13 mg/dL (ref 6–20)
CO2: 24 mmol/L (ref 22–32)
Calcium: 9 mg/dL (ref 8.9–10.3)
Chloride: 105 mmol/L (ref 98–111)
Creatinine, Ser: 0.71 mg/dL (ref 0.44–1.00)
GFR, Estimated: >60 mL/min (ref >60)
Glucose, Bld: 124 mg/dL — ABNORMAL HIGH (ref 70–99)
Potassium: 3.9 mmol/L (ref 3.5–5.1)
Sodium: 137 mmol/L (ref 135–145)
Total Bilirubin: 0.3 mg/dL (ref 0.3–1.2)
Total Protein: 7.1 g/dL (ref 6.5–8.1)

## 2021-10-04 LAB — POC URINE PREG, ED: Preg Test, Ur: NEGATIVE

## 2021-10-04 LAB — ETHANOL: Alcohol, Ethyl (B): <10 mg/dL (ref <10)

## 2021-10-04 MED ORDER — IBUPROFEN 800 MG PO TABS
800.0000 mg | ORAL_TABLET | Freq: Once | ORAL | Status: AC
  Administered 2021-10-04: 800 mg via ORAL
  Filled 2021-10-04: qty 1

## 2021-10-04 MED ORDER — FLUOXETINE HCL 20 MG PO CAPS
20.0000 mg | ORAL_CAPSULE | Freq: Every day | ORAL | Status: DC
  Administered 2021-10-04 – 2021-10-06 (×3): 20 mg via ORAL
  Filled 2021-10-04 (×3): qty 1

## 2021-10-04 MED ORDER — IBUPROFEN 400 MG PO TABS
600.0000 mg | ORAL_TABLET | Freq: Three times a day (TID) | ORAL | Status: DC | PRN
  Administered 2021-10-04 – 2021-10-06 (×4): 600 mg via ORAL
  Filled 2021-10-04 (×4): qty 2

## 2021-10-04 MED ORDER — CYCLOBENZAPRINE HCL 10 MG PO TABS
10.0000 mg | ORAL_TABLET | Freq: Three times a day (TID) | ORAL | Status: DC | PRN
  Administered 2021-10-04 – 2021-10-06 (×4): 10 mg via ORAL
  Filled 2021-10-04 (×4): qty 1

## 2021-10-04 MED ORDER — ALBUTEROL SULFATE HFA 108 (90 BASE) MCG/ACT IN AERS: 2.0000 | INHALATION_SPRAY | Freq: Four times a day (QID) | RESPIRATORY_TRACT | Status: DC | PRN

## 2021-10-04 MED ORDER — OXYCODONE-ACETAMINOPHEN 5-325 MG PO TABS
1.0000 | ORAL_TABLET | Freq: Once | ORAL | Status: AC
  Administered 2021-10-04: 1 via ORAL
  Filled 2021-10-04: qty 1

## 2021-10-04 NOTE — ED Provider Notes (Signed)
[facility] EMERGENCY DEPARTMENT Provider Note   CSN: [id number] Arrival date & time: 10/04/21  [id number]     History  Chief Complaint  Patient presents with   Psychiatric Evaluation    Sandra Orr is a 51 y.o. female.  HPI  Patient with medical history including depression, anxiety, presents with complaints of feeling depressed and no longer wanting to live.  Patient states that she has been depressed since her parents passed away about 3 years ago, she states that she has no energy, does not have enjoyment in life, and she no longer wants to wake up.  She has no active plan on how she would end her life, she has never attempted suicide in the past, she states that she is homeless and bounces back between living with her daughter and finding shelters.  She endorses that she has been using cocaine, last time she used was 5 days ago, she states that she has been out of her medication as she does not have money to fill them, she has been without them for last 2 months.  She denies any homicidal ideations, denies any hallucinations delusions.  Reviewed patient's chart Dansie history of her being hospitalized for suicidal homicidal ideations appears that she was prescribed fluoxetine for depression.  Home Medications Prior to Admission medications   Medication Sig Start Date End Date Taking? Authorizing Provider  albuterol (VENTOLIN HFA) 108 (90 Base) MCG/ACT inhaler Inhale 2 puffs into the lungs every 6 (six) hours as needed for wheezing or shortness of breath. 09/09/20   Soyla Dryer, PA-C  diclofenac Sodium (VOLTAREN) 1 % GEL Apply 2 g topically 4 (four) times daily as needed. 04/30/21   Long, Wonda Olds, MD  ibuprofen (ADVIL) 600 MG tablet Take 1 tablet (600 mg total) by mouth 3 (three) times daily. 04/30/21   Long, Wonda Olds, MD  naproxen (NAPROSYN) 500 MG tablet Take 1 tablet (500 mg total) by mouth 2 (two) times daily with a meal. 01/17/21   Triplett, Tammy, PA-C   oxyCODONE-acetaminophen (PERCOCET) 5-325 MG tablet Take 1 tablet by mouth every 6 (six) hours as needed. 03/09/21   Milton Ferguson, MD  predniSONE (DELTASONE) 20 MG tablet 2 tabs po daily x 3 days 03/09/21   Milton Ferguson, MD      Allergies    Penicillins    Review of Systems   Review of Systems  Constitutional:  Negative for chills and fever.  Respiratory:  Negative for shortness of breath.   Cardiovascular:  Negative for chest pain.  Gastrointestinal:  Negative for abdominal pain.  Neurological:  Negative for headaches.  Psychiatric/Behavioral:  Positive for suicidal ideas. Negative for hallucinations and self-injury.    Physical Exam Updated Vital Signs BP (!) 111/51 (BP Location: Right Arm)    Pulse (!) 50    Temp 97.6 F (36.4 C) (Oral)    Resp 18    Ht 5\' 5"  (1.651 m)    Wt (!) 142 kg    LMP 05/06/2016    SpO2 99%    BMI 52.09 kg/m  Physical Exam Vitals and nursing note reviewed.  Constitutional:      General: She is not in acute distress.    Appearance: She is not ill-appearing.  HENT:     Head: Normocephalic and atraumatic.     Nose: No congestion.  Eyes:     Conjunctiva/sclera: Conjunctivae normal.  Cardiovascular:     Rate and Rhythm: Normal rate and regular rhythm.     Pulses:  Normal pulses.     Heart sounds: No murmur heard.   No friction rub. No gallop.  Pulmonary:     Effort: No respiratory distress.     Breath sounds: No wheezing, rhonchi or rales.  Abdominal:     Palpations: Abdomen is soft.     Tenderness: There is no abdominal tenderness. There is no right CVA tenderness or left CVA tenderness.  Skin:    General: Skin is warm and dry.  Neurological:     Mental Status: She is alert.     Comments: No facial asymmetry, no difficult word finding, able follow two-step commands, no unilateral weakness present.  Psychiatric:        Mood and Affect: Mood normal.     Comments: Alert and oriented, does not appear to be responding to internal stimuli, endorses  suicidal ideations without a active plan      ED Results / Procedures / Treatments   Labs (all labs ordered are listed, but only abnormal results are displayed) Labs Reviewed  COMPREHENSIVE METABOLIC PANEL - Abnormal; Notable for the following components:      Result Value   Glucose, Bld 124 (*)    All other components within normal limits  RESP PANEL BY RT-PCR (FLU A&B, COVID) [id number]  ETHANOL  CBC WITH DIFFERENTIAL/PLATELET  RAPID URINE DRUG SCREEN, HOSP PERFORMED  POC URINE PREG, ED    EKG None  Radiology No results found.  Procedures Procedures    Medications Ordered in ED Medications  ibuprofen (ADVIL) tablet 600 mg (has no administration in time range)  FLUoxetine (PROZAC) capsule 20 mg (has no administration in time range)  albuterol (VENTOLIN HFA) 108 (90 Base) MCG/ACT inhaler 2 puff (has no administration in time range)  oxyCODONE-acetaminophen (PERCOCET/ROXICET) 5-325 MG per tablet 1 tablet (1 tablet Oral Given 10/04/21 1134)  ibuprofen (ADVIL) tablet 800 mg (800 mg Oral Given 10/04/21 1616)    ED Course/ Medical Decision Making/ A&P                           Medical Decision Making Amount and/or Complexity of Data Reviewed Labs: ordered.  Risk Prescription drug management.   This patient presents to the ED for concern of suicidal ideation, this involves an extensive number of treatment options, and is a complaint that carries with it a high risk of complications and morbidity.  The differential diagnosis includes metabolic abnormality, CVA, psychiatric emergency    Additional history obtained:  Additional history obtained from electronic medical record External records from outside source obtained and reviewed including please see HPI for further detail   Co morbidities that complicate the patient evaluation  Depression  Social Determinants of Health:  Homelessness    Lab Tests:  I Ordered, and personally interpreted labs.  The pertinent  results include: CBC unremarkable, CMP shows glucose of 124, ethanol less than 10,   Imaging Studies ordered:  I ordered imaging studies including N/A   EKG-sinus without signs of ischemia     Reevaluation:  Patient is endorsing suicidal ideations, I am concerned for her safety, will IVC at this time, and continue to monitor.  Patient is medically clear at this time will place in a psych hold and await TTS recommendations  Rule out Low suspicion for systemic infection patient is nontoxic-appearing vital signs reassuring does not meet sepsis or SIRS criteria.  Low suspicion for metabolic abnormality she has no significant electrolyte derailments.  Low suspicion for CVA  or internal head bleed no recent head trauma, not on anticoagulant, no focal deficit on my exam.    Dispostion and problem list  After consideration of the diagnostic results and the patients response to treatment, I feel that the patent would benefit from patient will be placed in a psych hold, home meds will be ordered (started back on fluoxetine) ibuprofen for minor pain and await TTS recommendations.             Final Clinical Impression(s) / ED Diagnoses Final diagnoses:  None    Rx / DC Orders ED Discharge Orders     None         Marcello Fennel, PA-C 10/04/21 1640    Jeanell Sparrow, DO 10/05/21 1059

## 2021-10-04 NOTE — ED Triage Notes (Addendum)
Pt arrived via POV with complaints of increased anxiety and depression. Pt states mother recently died and this caused the increased depression. Pt denies plan at this time. Hx of SI with attempt.

## 2021-10-04 NOTE — ED Notes (Signed)
Patient given medication for back pain.

## 2021-10-04 NOTE — ED Notes (Signed)
Pt is resting at this time
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
## 2021-10-04 NOTE — ED Notes (Signed)
Fax IVC paperwork to [employer] and to [facility].

## 2021-10-04 NOTE — ED Notes (Signed)
Placed Pt. Belongs in locker room. 3 bags and a cane.  BAG 1: Pt. Bookbag with cell phone and 2 pairs of glasses (inside glasses case) in front pouch  BAG 2: jacket, pants shirt  BAG 3: tennis shoes

## 2021-10-04 NOTE — ED Notes (Signed)
Pt getting agitated due to another pt in another room. Told nurse and no meds were ordered at the moment. Instead moved pt to H7 at the nurses station to deescalate the situation.

## 2021-10-04 NOTE — ED Notes (Signed)
[facility] explained that she is IVC'd at this time.LSM

## 2021-10-04 NOTE — ED Notes (Signed)
Pt ambulated to restroom and given urine sample. Pt given more drink and changed face mask.

## 2021-10-04 NOTE — ED Notes (Signed)
Pt is having TTS °

## 2021-10-05 NOTE — ED Notes (Signed)
Pt was given crackers and juice at this time.Sandra Orr

## 2021-10-05 NOTE — ED Notes (Signed)
Error accidentally click COVID swab . Will reorder

## 2021-10-05 NOTE — ED Notes (Signed)
Breakfast tray given to pt 

## 2021-10-05 NOTE — Progress Notes (Signed)
Per Teodora Medici, patient meets criteria for inpatient treatment. There are no available or appropriate beds at [facility] today. CSW faxed referrals to the following facilities for review:  [facility]  Pending - No Request Sent N/A [address] [place] [address] [phone] --  [facility]  Pending - No Request Sent N/A [address], [place] [address] [phone] --  [facility]  Pending - No Request Sent N/A [address] [place] [address] [phone] --  [facility]  Pending - No Request Sent N/A [address] [place] [address] [phone] --  [facility]  Pending - No Request Sent N/A [address] [place] [address] [phone] --  [facility]  Pending - No Request Sent N/A [address] [place] [address] [phone] --  [facility]  Pending - No Request Sent N/A [address] [place], [place] [address] [phone] --  [facility]  Pending - No Request Sent N/A [address] [place] [address] [phone] --  [facility]  Pending - No Request Sent N/A [address] [place] [address] [phone] --  [facility]  Pending - No Request Sent N/A [address] [place] [address] [phone] --  [facility]  Pending - No Request Sent N/A [address] [place] [address] [phone] --  [facility]  Pending - No Request Sent N/A [address], [place] [address] [phone] --  [facility]  Pending - No Request Sent N/A [address] [place] [address] [phone] --  [facility]  Pending - No Request Sent N/A [address] [place] [address] [phone] --  [facility]  Pending - No Request Sent N/A [address] [place] [address] [phone] --  [facility]  Pending - No Request Sent N/A [address] [place] [address] [phone] --  [facility]  Pending - No Request Sent N/A [address] [place] [address] [phone] --    TTS will continue to seek bed placement.  Crissie Reese, MSW, LCSW-A, LCAS-A Phone: [phone] Disposition/TOC

## 2021-10-05 NOTE — ED Notes (Signed)
The was given a recliner at this time she was complaining of back pain from laying  on stretcher for several days . Kollin Udell

## 2021-10-06 ENCOUNTER — Encounter (HOSPITAL_COMMUNITY): Payer: Self-pay | Admitting: Behavioral Health

## 2021-10-06 DIAGNOSIS — J449 Chronic obstructive pulmonary disease, unspecified: Secondary | ICD-10-CM | POA: Insufficient documentation

## 2021-10-06 DIAGNOSIS — F32A Depression, unspecified: Secondary | ICD-10-CM

## 2021-10-06 DIAGNOSIS — F17200 Nicotine dependence, unspecified, uncomplicated: Secondary | ICD-10-CM

## 2021-10-06 MED ORDER — KETOROLAC TROMETHAMINE 30 MG/ML IJ SOLN
15.0000 mg | Freq: Once | INTRAMUSCULAR | Status: AC
  Administered 2021-10-06: 15 mg via INTRAMUSCULAR
  Filled 2021-10-06: qty 1

## 2021-10-06 NOTE — ED Provider Notes (Addendum)
°  Physical Exam  BP 107/62 (BP Location: Right Arm)    Pulse (!) 51    Temp 98.7 F (37.1 C) (Oral)    Resp 20    Ht 5\' 5"  (1.651 m)    Wt (!) 142 kg    LMP 05/06/2016    SpO2 100%    BMI 52.09 kg/m   Physical Exam  Procedures  Procedures  ED Course / MDM    Medical Decision Making Amount and/or Complexity of Data Reviewed Labs: ordered.  Risk Prescription drug management.  Under IVC.  Pending psychiatric placement for suicidal and substance abuse.  Appears of been faxed out to 17 places but no bed yet found yet.  Hopefully some progress can be made today although it is a holiday.       05/08/2016, MD 10/06/21 [phone]  0258 from psychiatry is now seen patient.  Cleared for discharge.  No longer suicidal.  Outpatient resources given   Pearletha Furl, MD 10/06/21 1329

## 2021-10-06 NOTE — ED Notes (Signed)
TTS in process 

## 2021-10-06 NOTE — ED Notes (Signed)
Pt's belongings given to patient, patient verified all belongings are present. Discharge instructions given to patient. Patient verbalized understanding.

## 2021-10-06 NOTE — Progress Notes (Signed)
Patient has been faxed out per the request of Dr. Lucianne Muss. Patient meets BH inpatient criteria per Cecilio Asper, NP. Patient has been faxed out to the following facilities:    [facility]  [address], [place] [address] [phone]  [facility]  [address], [place] [address] [phone]  [facility]  [address], [place] [address] [phone]  [facility]  [address] [place] [address] [phone]  [facility]  [address] [place] [address] [phone]  [facility]  [address] [place] [address] [phone]  [facility]  [address] [place], [place] [address] [phone]  [facility]  [address] [place] [address] [phone]  [facility]  [address] [place] [address] [phone]  [facility]  [address] [place] [address] [phone]  [facility]  [address] [place] [address] [phone]  [facility]  [address], [place] [address] [phone]  [facility]  [address], [place] [address] [phone]  [facility]  [address] [place] [address] [phone]  [facility]  [address], [place] [address] [phone]  [facility]  [address] [place] [address] [phone]  [facility]  [address] [place] [address] [phone]   Damita Dunnings, MSW, LCSW-A  10:24 AM 10/06/2021

## 2021-10-06 NOTE — Consult Note (Signed)
Telepsych Consultation   Reason for Consult:  Psychiatry Clearance Referring Physician:  Dr. Rubin Payor Location of Patient:  [facility] Location of Provider: Behavioral Health TTS Department  Patient Identification: Sandra Orr MRN:  [id number] Principal Diagnosis: <principal problem not specified> Diagnosis:  Active Problems:   Depression   Tobacco abuse  Total Time spent with patient: 30 minutes  Subjective: "Have life stressors and depressed."  HPI:  Sandra Orr is a 51 y.o. female patient with medical history including depression, anxiety, presents involuntarily to [facility] with complaints of "feeling depressed and no longer wanting to live". Patient was seen face-to-face via tele-health. Her chart was reviewed, and findings discussed with the treatment team and Dr. Lucianne Muss.  On encounter, patient is alert and oriented x 4 and responds appropriately to questions. Patient reported that she has been depressed since her parents passed away about 3 years ago and is currently homeless and bounces back between living with her daughter and finding shelters. Endorsed reduced  energy, anhedonia, and she no longer wants to wake up. She has no active plan on how she would end her life, and she has never attempted suicide in the past. She endorsed using cocaine, last time she used was 5 days ago. She reported that she has been out of her medication for the past 2 months and does not have money to refill them. She denies any suicidal ideation, homicidal ideations, hallucinations or delusions.  After examination by treatment team, this provider  feel that the patent would benefit from out-patient resources. Home meds will be ordered (started back on fluoxetine). Community resources for outpatient therapy provided and [facility] physician and nursing staff aware of patient's disposition.   Past Psychiatric History: Depression and Anxiety  Risk to Self: No  Risk to Others: No  Prior Inpatient Therapy:   No Prior Outpatient Therapy:  Yes  Past Medical History:  Past Medical History:  Diagnosis Date   Anxiety    Arthritis    Asthma    Back pain    Carpal tunnel syndrome    Depression    DM type 2 (diabetes mellitus, type 2) (HCC) 02/04/2015   A1c 6.5 (01/11/15)    GERD (gastroesophageal reflux disease)    Obesity     Past Surgical History:  Procedure Laterality Date   CHOLECYSTECTOMY     TUBAL LIGATION     Family History:  Family History  Problem Relation Age of Onset   Depression Mother        bipolar/schizo   Heart disease Mother    Diabetes Mother    Asthma Father    Hyperlipidemia Father    Depression Maternal Grandmother    Heart disease Maternal Grandmother    Diabetes Maternal Grandmother    Hypertension Maternal Grandfather    Breast cancer Paternal Aunt    Family Psychiatric  History: None noted Social History:  Social History   Substance and Sexual Activity  Alcohol Use No   Alcohol/week: 0.0 standard drinks     Social History   Substance and Sexual Activity  Drug Use Yes   Frequency: 1.0 times per week   Types: Cocaine   Comment: Cocaine x4-5 a week    Social History   Socioeconomic History   Marital status: Single    Spouse name: Not on file   Number of children: Not on file   Years of education: Not on file   Highest education level: Not on file  Occupational History   Not on  file  Tobacco Use   Smoking status: Every Day    Packs/day: 1.00    Years: 31.00    Pack years: 31.00    Types: Cigarettes   Smokeless tobacco: Never  Vaping Use   Vaping Use: Never used  Substance and Sexual Activity   Alcohol use: No    Alcohol/week: 0.0 standard drinks   Drug use: Yes    Frequency: 1.0 times per week    Types: Cocaine    Comment: Cocaine x4-5 a week   Sexual activity: Not Currently    Birth control/protection: Surgical  Other Topics Concern   Not on file  Social History Narrative   Single, Unemployed, attended some high school. She  stays with different friends, no stable home. Public transportation.   Social Determinants of Health   Financial Resource Strain: Not on file  Food Insecurity: Not on file  Transportation Needs: Not on file  Physical Activity: Not on file  Stress: Not on file  Social Connections: Not on file   Additional Social History:   Allergies:   Allergies  Allergen Reactions   Penicillins Hives    Has patient had a PCN reaction causing immediate rash, facial/tongue/throat swelling, SOB or lightheadedness with hypotension: No Has patient had a PCN reaction causing severe rash involving mucus membranes or skin necrosis: No Has patient had a PCN reaction that required hospitalization No Has patient had a PCN reaction occurring within the last 10 years: No If all of the above answers are "NO", then may proceed with Cephalosporin use.    Labs:  Results for orders placed or performed during the hospital encounter of 10/04/21 (from the past 48 hour(s))  POC urine preg, ED     Status: None   Collection Time: 10/04/21  9:29 PM  Result Value Ref Range   Preg Test, Ur NEGATIVE NEGATIVE    Comment:        THE SENSITIVITY OF THIS METHODOLOGY IS >24 mIU/mL    Medications:  Current Facility-Administered Medications  Medication Dose Route Frequency Provider Last Rate Last Admin   albuterol (VENTOLIN HFA) 108 (90 Base) MCG/ACT inhaler 2 puff  2 puff Inhalation Q6H PRN Carroll Sage, PA-C       cyclobenzaprine (FLEXERIL) tablet 10 mg  10 mg Oral TID PRN Carroll Sage, PA-C   10 mg at 10/06/21 0959   FLUoxetine (PROZAC) capsule 20 mg  20 mg Oral Daily Carroll Sage, PA-C   20 mg at 10/06/21 0847   ibuprofen (ADVIL) tablet 600 mg  600 mg Oral Q8H PRN Carroll Sage, PA-C   600 mg at 10/06/21 2637   Current Outpatient Medications  Medication Sig Dispense Refill   albuterol (VENTOLIN HFA) 108 (90 Base) MCG/ACT inhaler Inhale 2 puffs into the lungs every 6 (six) hours as needed for  wheezing or shortness of breath. 3 each 0   diclofenac Sodium (VOLTAREN) 1 % GEL Apply 2 g topically 4 (four) times daily as needed. (Patient not taking: Reported on 10/06/2021) 100 g 0   ibuprofen (ADVIL) 600 MG tablet Take 1 tablet (600 mg total) by mouth 3 (three) times daily. (Patient not taking: Reported on 10/06/2021) 60 tablet 0   naproxen (NAPROSYN) 500 MG tablet Take 1 tablet (500 mg total) by mouth 2 (two) times daily with a meal. (Patient not taking: Reported on 10/06/2021) 14 tablet 0   oxyCODONE-acetaminophen (PERCOCET) 5-325 MG tablet Take 1 tablet by mouth every 6 (six) hours as needed. (Patient  not taking: Reported on 10/06/2021) 20 tablet 0   predniSONE (DELTASONE) 20 MG tablet 2 tabs po daily x 3 days (Patient not taking: Reported on 10/06/2021) 6 tablet 0   Musculoskeletal: Strength & Muscle Tone: within normal limits Gait & Station: normal Patient leans: N/A  Psychiatric Specialty Exam:  Presentation  General Appearance: Appropriate for Environment; Casual; Fairly Groomed  Eye Contact:Good  Speech:Normal Rate; Clear and Coherent  Speech Volume:Normal  Handedness:Right  Mood and Affect  Mood:Depressed; Anxious  Affect:Appropriate; Depressed  Thought Process  Thought Processes:Coherent  Descriptions of Associations:Intact  Orientation:Full (Time, Place and Person)  Thought Content:Logical; WDL  History of Schizophrenia/Schizoaffective disorder:No  Duration of Psychotic Symptoms:No data recorded Hallucinations:Hallucinations: None  Ideas of Reference:None  Suicidal Thoughts:Suicidal Thoughts: No  Homicidal Thoughts:Homicidal Thoughts: No  Sensorium  Memory:Immediate Fair; Recent Fair; Remote Fair  Judgment:Fair  Insight:Fair  Executive Functions  Concentration:Fair  Attention Span:Good  Recall:Good  Fund of Knowledge:Fair  Language:Good  Psychomotor Activity  Psychomotor Activity:Psychomotor Activity: Normal  Assets   Assets:Communication Skills; Physical Health  Sleep  Sleep:Sleep: Good Number of Hours of Sleep: 8  Physical Exam: Physical Exam Vitals and nursing note reviewed.  Constitutional:      Appearance: Normal appearance.  HENT:     Head: Normocephalic and atraumatic.     Right Ear: External ear normal.     Left Ear: External ear normal.     Nose: Nose normal.     Mouth/Throat:     Mouth: Mucous membranes are moist.  Eyes:     Extraocular Movements: Extraocular movements intact.     Conjunctiva/sclera: Conjunctivae normal.  Cardiovascular:     Rate and Rhythm: Normal rate.     Pulses: Normal pulses.  Pulmonary:     Effort: Pulmonary effort is normal.  Abdominal:     Palpations: Abdomen is soft.  Musculoskeletal:        General: Normal range of motion.     Cervical back: Normal range of motion and neck supple.  Skin:    General: Skin is warm.  Neurological:     General: No focal deficit present.     Mental Status: She is alert and oriented to person, place, and time.  Psychiatric:        Behavior: Behavior normal.   Review of Systems  Constitutional: Negative.   HENT: Negative.    Respiratory: Negative.    Gastrointestinal: Negative.   Genitourinary: Negative.   Musculoskeletal: Negative.   Skin: Negative.   Neurological: Negative.   Endo/Heme/Allergies: Negative.   Psychiatric/Behavioral:  Positive for depression and suicidal ideas. The patient is nervous/anxious.   Blood pressure (!) 92/54, pulse (!) 58, temperature 98.2 F (36.8 C), resp. rate 18, height 5\' 5"  (1.651 m), weight (!) 142 kg, last menstrual period 05/06/2016, SpO2 97 %. Body mass index is 52.09 kg/m.  Treatment Plan Summary: Daily contact with patient to assess and evaluate symptoms and progress in treatment and Medication management  Disposition: No evidence of imminent risk to self or others at present.   Patient does not meet criteria for psychiatric inpatient admission. Supportive therapy  provided about ongoing stressors. Discussed crisis plan, support from social network, calling 911, coming to the Emergency Department, and calling Suicide Hotline.  This service was provided via telemedicine using a 2-way, interactive audio and video technology.  Names of all persons participating in this telemedicine service and their role in this encounter. Name: Sandra Orr Role: Patient  Name: Alan Mulder Role: NP  Name: Dr. Lucianne MussKumar Role: Medical Director  Name:  Role:     Cecilie Lowersina C Eero Dini, FNP 10/06/2021 4:51 PM

## 2021-10-07 ENCOUNTER — Telehealth: Payer: Self-pay

## 2021-10-07 DIAGNOSIS — Z748 Other problems related to care provider dependency: Secondary | ICD-10-CM

## 2021-10-07 NOTE — Telephone Encounter (Signed)
Transition Care Management Follow-up Telephone Call Date of discharge and from where: 10/06/2021-[facility]  How have you been since you were released from the hospital? Pt stated she is not ok and would like to be seen at [facility] but lacks transportation. Any questions or concerns? No  Items Reviewed: Did the pt receive and understand the discharge instructions provided? Yes  Medications obtained and verified?  No medications given at discharge  Other? No  Any new allergies since your discharge? No  Dietary orders reviewed? No Do you have support at home? No   Home Care and Equipment/Supplies: Were home health services ordered? not applicable If so, what is the name of the agency? N/A  Has the agency set up a time to come to the patient's home? not applicable Were any new equipment or medical supplies ordered?  No What is the name of the medical supply agency? N/A Were you able to get the supplies/equipment? not applicable Do you have any questions related to the use of the equipment or supplies? No  Functional Questionnaire: (I = Independent and D = Dependent) ADLs: I  Bathing/Dressing- I  Meal Prep- I  Eating- I  Maintaining continence- I  Transferring/Ambulation- I  Managing Meds- I  Follow up appointments reviewed:  PCP Hospital f/u appt confirmed? No   Specialist Hospital f/u appt confirmed? No  Are transportation arrangements needed? Yes  If their condition worsens, is the pt aware to call PCP or go to the Emergency Dept.? Yes Was the patient provided with contact information for the PCP's office or ED? Yes Was to pt encouraged to call back with questions or concerns? Yes

## 2021-10-10 ENCOUNTER — Other Ambulatory Visit: Payer: Self-pay | Admitting: *Deleted

## 2021-10-10 NOTE — Patient Outreach (Cosign Needed Addendum)
Medicaid Managed Care Social Work Note  10/10/2021 Name:  Sandra Orr MRN:  [id number] DOB:  09/29/1970  Sandra Orr is an 51 y.o. year old female who is a primary patient of Pcp, No.  The Medicaid Managed Care Coordination team was consulted for assistance with:  Mental Health Counseling and Resources  Sandra Orr was given information about Medicaid Managed Care Coordination team services today. Sandra Orr Patient agreed to services and verbal consent obtained.  Engaged with patient  for by telephone forinitial visit in response to referral for case management and/or care coordination services.   Assessments/Interventions:  Review of past medical history, allergies, medications, health status, including review of consultants reports, laboratory and other test data, was performed as part of comprehensive evaluation and provision of chronic care management services.  SDOH: (Social Determinant of Health) assessments and interventions performed:   Advanced Directives Status:  Not addressed in this encounter.  Care Plan                 Allergies  Allergen Reactions   Penicillins Hives    Has patient had a PCN reaction causing immediate rash, facial/tongue/throat swelling, SOB or lightheadedness with hypotension: No Has patient had a PCN reaction causing severe rash involving mucus membranes or skin necrosis: No Has patient had a PCN reaction that required hospitalization No Has patient had a PCN reaction occurring within the last 10 years: No If all of the above answers are "NO", then may proceed with Cephalosporin use.     Medications Reviewed Today     Reviewed by Wynelle Link, CPhT (Pharmacy Technician) on 10/06/21 at 1151  Med List Status: Complete   Medication Order Taking? Sig Documenting Provider Last Dose Status Informant  albuterol (VENTOLIN HFA) 108 (90 Base) MCG/ACT inhaler [id number] Yes Inhale 2 puffs into the lungs every 6 (six) hours as needed for  wheezing or shortness of breath. Jacquelin Hawking, PA-C UNK Active Self           Med Note Wynelle Link   Mon Oct 06, 2021 11:49 AM) Has but hasn't used  diclofenac Sodium (VOLTAREN) 1 % GEL [id number] No Apply 2 g topically 4 (four) times daily as needed.  Patient not taking: Reported on 10/06/2021   Maia Plan, MD Completed Course Active Self  ibuprofen (ADVIL) 600 MG tablet [id number] No Take 1 tablet (600 mg total) by mouth 3 (three) times daily.  Patient not taking: Reported on 10/06/2021   Maia Plan, MD Not Taking Active Self  naproxen (NAPROSYN) 500 MG tablet [id number] No Take 1 tablet (500 mg total) by mouth 2 (two) times daily with a meal.  Patient not taking: Reported on 10/06/2021   Pauline Aus, PA-C Not Taking Active Self  oxyCODONE-acetaminophen (PERCOCET) 5-325 MG tablet [id number] No Take 1 tablet by mouth every 6 (six) hours as needed.  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   Abdominal pain 10/18/2015  Constipation 10/18/2015   Rash and nonspecific skin eruption 10/18/2015   Bilateral knee pain 07/15/2015   Carpal tunnel syndrome 07/15/2015   Homelessness 05/30/2015   Essential hypertension, benign 05/30/2015   Morbid obesity ([place]) 05/04/2012   Depression 05/04/2012   Chronic pain 05/04/2012   Tobacco abuse 05/04/2012    Conditions to be addressed/monitored per PCP order:  Anxiety and Depression  Care Plan : General Social Work (Adult)   Updates made by Sandra Claude, LCSW since 10/10/2021 12:00 AM     Problem: CHL AMB "PATIENT-SPECIFIC PROBLEM"   Note:   CARE PLAN ENTRY (see longitudinal plan of care for additional care plan information)  Current Barriers:  Knowledge deficits related to accessing mental health provider in patient with Depression  Patient is experiencing symptoms of  depression which seem to be exacerbated by financial difficulties, family conflicts and housing barriers..     Patient needs Support, Education, and Care Coordination in order to meet unmet mental health needs  Mental Health Concerns -recent discharge from hospital due to suicidal ideations-patient confirms having no suicidal or homicidal thoughts at this time.  Clinical Social Work Goal(s):  Over the next 90 days, patient will work with SW bi-weekly by telephone or in person to reduce or manage symptoms of depression until connected for ongoing counseling resources.  Patient will implement clinical interventions discussed today to decreases symptoms of depression and increase knowledge and/or ability of: self-management skills.  Interventions:  Assessed patient's understanding, education, previous treatment and care coordination needs  Patient interviewed and appropriate assessments performed: PHQ 2 PHQ 9 Provided basic mental health support, education and interventions  Patient discussed financial challenges, family discord and housing barriers  Patient confirmed having no income at this time-food stamps only-currently in the process of applying for disability due to bilateral knee pain Housing barriers-no permanent housing at this time, on the waiting list for [employer] Patient confirmed feelings of anxiety, agitation and hopelessness due to current situation-confirmed that past attempts for assistance have not been successful Collaborated with appropriate clinical care team members regarding patient needs Discussed  options for long  term counseling based on need and insurance.  Other interventions include: Motivational Interviewing employed PHQ2/ PHQ9 completed Verbalization of feelings encouraged Active listening / Reflection utilized  Emotional support provided Initiated discussion of problem solving strategies, mental health counseling encouraged, contacted [address] for available housing resources also encouraged Transportation resources to be provided as patient is agreeable to mental health follow up at [place]   Patient Self Care Activities & Deficits:  Patient is unable to independently navigate community resource options without care coordination support Patient is able to implement clinical interventions discussed today and is motivated for treatment  Patient will select one of the agencies from the list provided and call to schedule an appointment   Motivation for treatment  Initial goal documentation     Care Plan : General Social Work (Adult)  Updates made by Sandra Claude, LCSW since 10/10/2021 12:00 AM     Problem: CHL AMB "PATIENT-SPECIFIC PROBLEM"       Follow up:  Patient agrees to Care Plan and Follow-up.  Plan: The Managed Medicaid care management team will reach out to the patient again over the next 14 business days.  Date/time of next scheduled Social Work care management/care coordination outreach:  10/23/21   Elliot Gurney, [facility] Social Worker [phone]

## 2021-10-10 NOTE — Patient Instructions (Signed)
Visit Information  Sandra Orr was given information about Medicaid Managed Care team care coordination services as a part of their [facility] Medicaid benefit. Sandra Orr verbally consented to engagement with the [facility] Managed Care team.   If you are experiencing a medical emergency, please call 911 or report to your local emergency department or urgent care.   If you have a non-emergency medical problem during routine business hours, please contact your provider's office and ask to speak with a nurse.   For questions related to your [facility] health plan, please call: [phone] or go here:[email or web address]  If you would like to schedule transportation through your [facility] plan, please call the following number at least 2 days in advance of your appointment: [phone].  You can also use the MTM portal or MTM mobile app to manage your rides. For the portal, please go to [email or web address]  Call the [facility] Crisis Line at [phone], at any time, 24 hours a day, 7 days a week. If you are in danger or need immediate medical attention call 911.  If you would like help to quit smoking, call 1-800-QUIT-NOW ((225) 282-5258) OR Espaol: 1-855-Djelo-Ya (8-177-116-5790) o para ms informacin haga clic aqu or Text READY to 383-338 to register via text  [name] - following are the goals we discussed in your visit today:   Goals Addressed             This Visit's Progress    Find Help in My Community       Timeframe:  Long-Range Goal Priority:  Medium Start Date:     10/10/21                        Expected End Date:   04/09/22                    Follow Up Date 10/23/21    - begin a notebook of services in my neighborhood or community - call 211 when I need some help - follow-up on any referrals for help I am given - think ahead to make sure my need does not become an emergency - have a back-up plan - make a list of family or friends that  I can call    Why is this important?   Knowing how and where to find help for yourself or family in your neighborhood and community is an important skill.  You will want to take some steps to learn how.    Notes:         Patient verbalizes understanding of instructions and care plan provided today and agrees to view in MyChart. Active MyChart status confirmed with patient.    Sandra Orr Sandra Orr, [place] Managed [facility] Social Worker [phone]     Following is a copy of your plan of care:  Care Plan : General Social Work (Adult)  Updates made by Wenda Overland, LCSW since 10/10/2021 12:00 AM     Problem: CHL AMB "PATIENT-SPECIFIC PROBLEM"   Note:   CARE PLAN ENTRY (see longitudinal plan of care for additional care plan information)  Current Barriers:  Knowledge deficits related to accessing mental health provider in patient with Depression  Patient is experiencing symptoms of  depression which seem to be exacerbated by financial difficulties, family conflicts and housing barriers..     Patient needs Support, Education, and Care Coordination in order to meet unmet mental health needs  Mental Health Concerns -recent discharge from  hospital due to suicidal ideations-patient confirms having no suicidal or homicidal thoughts at this time.  Clinical Social Work Goal(s):  Over the next 90 days, patient will work with SW bi-weekly by telephone or in person to reduce or manage symptoms of depression until connected for ongoing counseling resources.  Patient will implement clinical interventions discussed today to decreases symptoms of depression and increase knowledge and/or ability of: self-management skills.  Interventions:  Assessed patient's understanding, education, previous treatment and care coordination needs  Patient interviewed and appropriate assessments performed: PHQ 2 PHQ 9 Provided basic mental health support, education and interventions  Patient discussed financial  challenges, family discord and housing barriers  Patient confirmed having no income at this time-food stamps only-currently in the process of applying for disability due to bilateral knee pain Housing barriers-no permanent housing at this time, on the waiting list for Section 8 Patient confirmed feelings of anxiety, agitation and hopelessness due to current situation-confirmed that past attempts for assistance have not been successful Collaborated with appropriate clinical care team members regarding patient needs Discussed  options for long term counseling based on need and insurance.  Other interventions include: Motivational Interviewing employed PHQ2/ PHQ9 completed Verbalization of feelings encouraged Active listening / Reflection utilized  Emotional support provided Initiated discussion of problem solving strategies, mental health counseling encouraged, contacted [address] for available housing resources also encouraged Transportation resources to be provided as patient is agreeable to mental health follow up at [place]   Patient Self Care Activities & Deficits:  Patient is unable to independently navigate community resource options without care coordination support Patient is able to implement clinical interventions discussed today and is motivated for treatment  Patient will select one of the agencies from the list provided and call to schedule an appointment   Motivation for treatment  Initial goal documentation     Care Plan : General Social Work (Adult)  Updates made by Vern Claude, LCSW since 10/10/2021 12:00 AM     Problem: CHL AMB "PATIENT-SPECIFIC PROBLEM"

## 2021-10-23 ENCOUNTER — Other Ambulatory Visit: Payer: Self-pay | Admitting: *Deleted

## 2021-10-23 DIAGNOSIS — M25562 Pain in left knee: Secondary | ICD-10-CM

## 2021-10-23 DIAGNOSIS — G8929 Other chronic pain: Secondary | ICD-10-CM

## 2021-10-24 NOTE — Patient Outreach (Signed)
?Medicaid Managed Care ?Social Work Note ? ?10/24/2021 ?Name:  Joesph FillersVeronica Ann Sumners MRN:  [id number] DOB:  10-Jan-1971 ? ?Joesph FillersVeronica Ann Hollinsworth is an 51 y.o. year old female who is a primary patient of Pcp, No.  The Medicaid Managed Care Coordination team was consulted for assistance with:  Community Resources  ?Mental Health Counseling and Resources ? ?Ms. Jennette Kettleeal was given information about Medicaid Managed Care Coordination team services today. Joesph FillersVeronica Ann Jutte Patient agreed to services and verbal consent obtained. ? ?Engaged with patient  for by telephone forfollow up visit in response to referral for case management and/or care coordination services.  ? ?Assessments/Interventions:  Review of past medical history, allergies, medications, health status, including review of consultants reports, laboratory and other test data, was performed as part of comprehensive evaluation and provision of chronic care management services. ? ?SDOH: (Social Determinant of Health) assessments and interventions performed: ? ? ?Advanced Directives Status:  Not addressed in this encounter. ? ?Care Plan ?                ?Allergies  ?Allergen Reactions  ? Penicillins Hives  ?  Has patient had a PCN reaction causing immediate rash, facial/tongue/throat swelling, SOB or lightheadedness with hypotension: No ?Has patient had a PCN reaction causing severe rash involving mucus membranes or skin necrosis: No ?Has patient had a PCN reaction that required hospitalization No ?Has patient had a PCN reaction occurring within the last 10 years: No ?If all of the above answers are "NO", then may proceed with Cephalosporin use. ?  ? ? ?Medications Reviewed Today   ? ? Reviewed by Wynelle Linkalton, [place], CPhT (Pharmacy Technician) on 10/06/21 at 1151  Med List Status: Complete  ? ?Medication Order Taking? Sig Documenting Provider Last Dose Status Informant  ?albuterol (VENTOLIN HFA) 108 (90 Base) MCG/ACT inhaler [id number] Yes Inhale 2 puffs into the lungs every 6 (six)  hours as needed for wheezing or shortness of breath. Jacquelin HawkingMcElroy, Shannon, PA-C Loreli SlotUNK Active Self  ?         ?Med Note Wynelle Link(DALTON, KEYANA   Mon Oct 06, 2021 11:49 AM) Has but hasn't used  ?diclofenac Sodium (VOLTAREN) 1 % GEL [id number] No Apply 2 g topically 4 (four) times daily as needed.  ?Patient not taking: Reported on 10/06/2021  ? Maia PlanLong, Joshua G, MD Completed Course Active Self  ?ibuprofen (ADVIL) 600 MG tablet [id number] No Take 1 tablet (600 mg total) by mouth 3 (three) times daily.  ?Patient not taking: Reported on 10/06/2021  ? Maia PlanLong, Joshua G, MD Not Taking Active Self  ?naproxen (NAPROSYN) 500 MG tablet [id number] No Take 1 tablet (500 mg total) by mouth 2 (two) times daily with a meal.  ?Patient not taking: Reported on 10/06/2021  ? Pauline Ausriplett, Tammy, PA-C Not Taking Active Self  ?oxyCODONE-acetaminophen (PERCOCET) 5-325 MG tablet [id number] No Take 1 tablet by mouth every 6 (six) hours as needed.  ?Patient not taking: Reported on 10/06/2021  ? Bethann BerkshireZammit, Joseph, MD Completed Course Active Self  ?predniSONE (DELTASONE) 20 MG tablet [id number] No 2 tabs po daily x 3 days  ?Patient not taking: Reported on 10/06/2021  ? Bethann BerkshireZammit, Joseph, MD Completed Course Active Self  ? ?  ?  ? ?  ? ? ?Patient Active Problem List  ? Diagnosis Date Noted  ? Chronic obstructive pulmonary disease, unspecified COPD type (HCC) 10/06/2021  ? Primary localized osteoarthritis of knees, bilateral 12/22/2017  ? Chronic pain of left knee 08/03/2016  ? 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 Abdominal  pain 10/18/2015  ? Constipation 10/18/2015  ? Rash and nonspecific skin eruption 10/18/2015  ? Bilateral knee pain 07/15/2015  ? Carpal tunnel syndrome 07/15/2015  ? Homelessness 05/30/2015  ? Essential hypertension, benign 05/30/2015  ? Morbid obesity (HCC) 05/04/2012  ? Depression 05/04/2012  ? Chronic pain 05/04/2012  ? Tobacco abuse 05/04/2012  ? ? ?Conditions to be addressed/monitored per PCP order:  Depression and Homelessness ? ?Care Plan : General  Social Work (Adult)  ?Updates made by Wenda Overland, LCSW since 10/24/2021 12:00 AM  ?  ? ?Problem: CHL AMB "PATIENT-SPECIFIC PROBLEM"   ?Note:   ?CARE PLAN ENTRY ?(see longitudinal plan of care for additional care plan information) ? ?Current Barriers:  ?Knowledge deficits related to accessing mental health provider in patient with Depression  ?Patient is experiencing symptoms of  depression which seem to be exacerbated by financial difficulties, family conflicts and housing barriers.Marland Kitchen     ?Patient needs Support, Education, and Care Coordination in order to meet unmet mental health needs  ?Mental Health Concerns -recent discharge from hospital due to suicidal ideations-patient confirms having no suicidal or homicidal thoughts at this time. ? ?Clinical Social Work Goal(s):  ?Over the next 90 days, patient will work with SW bi-weekly by telephone or in person to reduce or manage symptoms of depression until connected for ongoing counseling resources.  ?Patient will implement clinical interventions discussed today to decreases symptoms of depression and increase knowledge and/or ability of: self-management skills. ? ?Interventions:  ?Followed up with patient in regards to patient's mental health and community resource needs ?Patient continues to discuss financial challenges, family discord and housing barriers  ?Confirmed that patient continues to reside in a mobile home on the property of her father, however patient continues to look for alternative housing as mobile home is temporary, conflicts continue with family members that reside on the property ?Information provided for [facility] [employer], patient to call Melissa at [phone] on 10/23/21 or 10/24/21 at 1pm to follow up on bed availability ?Provided patient with contact information for [facility] Help for the [facility] [phone], patient to call to schedule a intake and then will be placed on the waiting list-patient continues to be on waiting list for [address]  [address].  ?Patient confirmed having no income at this time-food stamps only-currently in the process of applying for disability due to bilateral knee pain-court date of appeal coming up soon ?Provided patient with information for [facility] for mental health follow up-walk in clinic Monday-Friday 8am-2pm [address] [place], [place] [address] ?Referral placed to the [employer]guide for transportation resources ?Provided basic mental health support, education and interventions  ?Collaborated with appropriate clinical care team members regarding patient needs  ?Patient agreeable to contacting resources above for housing and mental health follow up ?Other interventions include: Motivational Interviewing employed ?Verbalization of feelings encouraged ?Active listening / Reflection utilized  ?Emotional support provided ? ? ? ?Patient Self Care Activities & Deficits:  ?Patient is unable to independently navigate community resource options without care coordination support ?Patient is able to implement clinical interventions discussed today and is motivated for treatment  ?Patient will select one of the agencies from the list provided and call to schedule an appointment   ?Motivation for treatment ? ?Please see past updates related to this goal by clicking on the "Past Updates" button in the selected goal  ? ?  ? ? ?Follow up:  Patient requests no follow-up at this time. ? ?Plan: The  Patient has been provided with contact  information for the Managed Medicaid care management team and has been advised to call with any health related questions or concerns. ? ? ?Asencion Guisinger, LCSW ?Managed Medicaid Social Worker ?[phone] ? ?

## 2021-10-24 NOTE — Addendum Note (Signed)
Addended by: Wenda Overland on: 10/24/2021 04:20 PM ? ? Modules accepted: Orders ? ?

## 2021-10-24 NOTE — Patient Instructions (Addendum)
Visit Information ? ?Thank you for taking time to visit with me today. Please don't hesitate to contact me if I can be of assistance to you for any questions or concerns related to your community resource needs. ? ?  ?- begin a notebook of services in my neighborhood or community ?- call 211 when I need some help ?- follow-up on any referrals for help I am given ?- think ahead to make sure my need does not become an emergency ?- have a back-up plan ?- make a list of family or friends that I can call  ?-please contact [facility] [place] [phone] ?-please contact [place] for the homeless [phone] ?-please follow up wit [facility] walk in clinic Monday-Friday 8-2pm to address your mental health needs [address] [place], [place] [address] ? ? ?If you are experiencing a Mental Health or [place] or need someone to talk to, please call the Suicide and Crisis Lifeline: 988  ? ?Patient verbalizes understanding of instructions and care plan provided today and agrees to view in [place]. Active MyChart status confirmed with patient.   ? ?No further follow up required: patient to contact this Education officer, museum with any additional community resource needs ? ?Ryley Bachtel, LCSW ?Managed Medicaid Social Worker ?[phone] ? ?

## 2021-10-27 ENCOUNTER — Telehealth: Payer: Self-pay

## 2021-10-27 NOTE — Telephone Encounter (Signed)
? ?  Telephone encounter was:  Successful.  ?10/27/2021 ?Name: Sandra Orr MRN: [id number] DOB: 04/07/1971 ? ?Sandra Orr is a 51 y.o. year old female who is a primary care patient of Pcp, No . The community resource team was consulted for assistance with Transportation Needs  ? ?Care guide performed the following interventions:  Patient needs transportation to all Medical appointments. She has medicaid and medicaid advised we need to contact [facility]. [facility] closes at 3pm. Pt advised she needs appts asap. I will contact [facility] at 8:30am tomorrow when they open. ? ?Follow Up Plan:  Care guide will follow up with patient by phone over the next few days. ? ?Hessie Knows ?Care Guide, Embedded Care Coordination ?[facility] Health  Care management  ?[place], [place] [address]  ?Main Phone: [phone]  E-mail: [email or web address]  ?Website: [email or web address] ? ? ? ?

## 2021-10-28 ENCOUNTER — Telehealth: Payer: Self-pay

## 2021-10-28 NOTE — Telephone Encounter (Signed)
? ?  Telephone encounter was:  Unsuccessful.  10/28/2021 ?Name: Sandra Orr MRN: [id number] DOB: 1970/12/10 ? ?Unsuccessful outbound call made today to assist with:  Transportation Needs  ? ?Outreach Attempt:  1st Attempt ? ?Voicemail box is not set up. I am unable to leave a message to reach pt. ? ?Cameron Proud ?Care Guide, Embedded Care Coordination ?[facility] Health  Care management  ?[place], [place]  ?Main Phone: [phone]  E-mail: [email or web address]  ?Website: [email or web address] ? ? ? ?

## 2021-10-31 ENCOUNTER — Telehealth: Payer: Self-pay

## 2021-10-31 NOTE — Telephone Encounter (Signed)
? ?  Telephone encounter was:  Unsuccessful.  10/31/2021 ?Name: Sandra Orr MRN: [id number] DOB: Mar 17, 1971 ? ?Unsuccessful outbound call made today to assist with:  Transportation Needs  ? ?Outreach Attempt:  2nd Attempt ? ?Voicemail box is not set up. I am unable to leave a message to reach pt. 2nd attempt. Need to reach pt regarding Transportation updates. ? ?Cameron Proud ?Care Guide, Embedded Care Coordination ?[facility]  Care management  ?[place], [place]  ?Main Phone: [phone]  E-mail: [email or web address]  ?Website: [email or web address] ? ? ? ?

## 2021-11-03 ENCOUNTER — Telehealth: Payer: Self-pay

## 2021-11-03 NOTE — Telephone Encounter (Signed)
? ?  Telephone encounter was:  Successful.  ?11/03/2021 ?Name: Appollonia Klee MRN: [id number] DOB: 1971-02-02 ? ?Cynde Menard is a 51 y.o. year old female who is a primary care patient of Pcp, No . The community resource team was consulted for assistance with Transportation Needs  and Insurance questions ? ?Care guide performed the following interventions:  Pt has been updated on resources:  Dr. Michelle Nasuti office does accept Medicaid. For [facility] Medicaid questions, she pt will call [phone] and for transportation with [facility] Medicaidcall, pt will call [phone] - [employer] Medicaid to book rides . This information has also been sent to pt's e-mail as well per request. ? ?Follow Up Plan:  No further follow up planned at this time. The patient has been provided with needed resources. ? ?Hessie Knows ?Care Guide, Embedded Care Coordination ?[facility]  Care management  ?[place], [place] [address]  ?Main Phone: [phone]  E-mail: [email or web address]  ?Website: [email or web address] ? ? ? ?

## 2021-11-05 ENCOUNTER — Ambulatory Visit (INDEPENDENT_AMBULATORY_CARE_PROVIDER_SITE_OTHER): Payer: Medicaid Other | Admitting: Family Medicine

## 2021-11-05 DIAGNOSIS — M25562 Pain in left knee: Secondary | ICD-10-CM

## 2021-11-05 DIAGNOSIS — M25561 Pain in right knee: Secondary | ICD-10-CM

## 2021-11-05 DIAGNOSIS — G8929 Other chronic pain: Secondary | ICD-10-CM | POA: Diagnosis not present

## 2021-11-05 DIAGNOSIS — M545 Low back pain, unspecified: Secondary | ICD-10-CM

## 2021-11-05 DIAGNOSIS — G5603 Carpal tunnel syndrome, bilateral upper limbs: Secondary | ICD-10-CM

## 2021-11-05 MED ORDER — OXYCODONE-ACETAMINOPHEN 5-325 MG PO TABS: 1.0000 | ORAL_TABLET | ORAL | 0 refills | Status: DC | PRN

## 2021-11-05 MED ORDER — PREDNISONE 10 MG PO TABS: ORAL_TABLET | ORAL | 0 refills | Status: DC

## 2021-11-05 NOTE — Patient Instructions (Addendum)
Start prednisone dose pack x 12 days. ?Percocet as needed for severe pain. ?Start physical therapy for your low back - this is necessary to be able to get an MRI in the future to consider injections back here. ?We will refer you to dietitian to help with weight loss - you will need this to have your knees replaced in the future. ?Follow up with me in 1 month ?

## 2021-11-06 ENCOUNTER — Encounter: Payer: Self-pay | Admitting: Family Medicine

## 2021-11-06 NOTE — Progress Notes (Signed)
PCP: Pcp, No ? ?Subjective:  ? ?HPI: ?Patient is a 51 y.o. female here for bilateral knee pain. ? ?Patient has known history of severe medial knee arthritis. ?In past has had steroid injections that provided temporary relief for a few months but most recent ones several months ago only helped for 1-2 weeks. ?Has not tried viscosupplementation in past. ?She has also had problems with numbness, pain in 1st-3rd digits of hands as well as chronic low back pain. ?Steroid dose packs helped in past though not so much with her back pain. ?Has not done physical therapy for her low back. ?BMI above 40 so not a current candidate for knee replacement surgery. ? ?Past Medical History:  ?Diagnosis Date  ? Anxiety   ? Arthritis   ? Asthma   ? Back pain   ? Carpal tunnel syndrome   ? Depression   ? DM type 2 (diabetes mellitus, type 2) (HCC) 02/04/2015  ? A1c 6.5 (01/11/15)   ? GERD (gastroesophageal reflux disease)   ? Obesity   ? ? ?Current Outpatient Medications on File Prior to Visit  ?Medication Sig Dispense Refill  ? albuterol (VENTOLIN HFA) 108 (90 Base) MCG/ACT inhaler Inhale 2 puffs into the lungs every 6 (six) hours as needed for wheezing or shortness of breath. 3 each 0  ? ?No current facility-administered medications on file prior to visit.  ? ? ?Past Surgical History:  ?Procedure Laterality Date  ? CHOLECYSTECTOMY    ? TUBAL LIGATION    ? ? ?Allergies  ?Allergen Reactions  ? Penicillins Hives  ?  Has patient had a PCN reaction causing immediate rash, facial/tongue/throat swelling, SOB or lightheadedness with hypotension: No ?Has patient had a PCN reaction causing severe rash involving mucus membranes or skin necrosis: No ?Has patient had a PCN reaction that required hospitalization No ?Has patient had a PCN reaction occurring within the last 10 years: No ?If all of the above answers are "NO", then may proceed with Cephalosporin use. ?  ? ? ?BP (!) 159/71   Ht 5\' 6"  (1.676 m)   Wt (!) 315 lb (142.9 kg)   LMP  05/06/2016   BMI 50.84 kg/m?  ? ? ?  06/09/2021  ?  1:47 PM  ?[facility]  ?Frequency of aerobic exercise (# of days/week) 0  ?Average time in minutes 0  ?Frequency of strengthening activities (# of days/week) 0  ? ? ?   ? View : No data to display.  ?  ?  ?  ? ? ?    ?Objective:  ?Physical Exam: ? ?Gen: NAD, comfortable in exam room ? ?Bilateral knees: ?No gross deformity, ecchymoses, swelling. ?TTP medial joint lines. ?ROM 0 - 100 degrees with normal strength. ?Negative ant/post drawers. Negative valgus/varus testing. Negative lachman. ?Negative mcmurrays, apleys. ?NV intact distally. ? ?Low back: ?No gross deformity, scoliosis. ?TTP bilateral paraspinal regions.  No midline or bony TTP. ?Strength LEs 5/5 all muscle groups.   ?Negative SLRs. ?Sensation intact to light touch bilaterally. ? ?Bilateral wrists: ?No deformity. ?FROM with 5/5 strength finger abduction, extension, thumb opposition. ?Tenderness to palpation carpal tunnel. ?Positive tinels carpal tunnels. ?  ?Assessment & Plan:  ?1. Bilateral knee pain - 2/2 known medial osteoarthritis.  Steroid injections not working as well given level of arthritis.  Will look into visco for her.  Prednisone dose pack and percocet if needed.  Dietitian referral as weight loss will help with pain but also needs to lose weight to get  BMI < 40 to be surgical candidate. ? ?2. Bilateral carpal tunnel - prednisone dose pack.  This is chronic.  Wrist braces at nighttime can help as well. ? ?3. Chronic low back pain - start physical therapy.  Prednisone with percocet if needed.  F/u in 1 month for all issues. ?

## 2021-11-18 ENCOUNTER — Ambulatory Visit: Payer: Medicaid Other

## 2021-12-03 ENCOUNTER — Telehealth: Payer: Self-pay | Admitting: Family Medicine

## 2021-12-03 MED ORDER — OXYCODONE-ACETAMINOPHEN 5-325 MG PO TABS: 1.0000 | ORAL_TABLET | ORAL | 0 refills | Status: DC | PRN

## 2021-12-03 NOTE — Telephone Encounter (Signed)
-----   Message from [facility], LAT sent at 12/02/2021  3:50 PM EDT ----- ?Regarding: FW: med refill ? ?----- Message ----- ?From: Lizbeth Bark ?Sent: 12/02/2021   3:25 PM EDT ?To: Rutha Bouchard, LAT ?Subject: med refill                                    ? ?Pt is asking for a refill on prednisone and oxycodone-acetaminophen(she is out of both meds).  She has applied and is waiting for an appt with  Dr. Sudie Bailey to be her pcp. ? ? ?

## 2021-12-03 NOTE — Telephone Encounter (Signed)
We did the 12 day prednisone dose pack just a month ago - do not recommend doing this again.  Reviewed database and will give 1 additional fill of oxycodone. ?

## 2021-12-08 ENCOUNTER — Ambulatory Visit: Payer: Medicaid Other | Admitting: Family Medicine

## 2021-12-30 ENCOUNTER — Telehealth: Payer: Self-pay | Admitting: Family Medicine

## 2021-12-30 NOTE — Telephone Encounter (Signed)
-----   Message from [facility], LAT sent at 12/30/2021  1:42 PM EDT ----- ?Regarding: FW: phone message ? ?----- Message ----- ?From: Carolyne Littles ?Sent: 12/30/2021   1:27 PM EDT ?To: Jolinda Croak, LAT ?Subject: phone message                                 ? ?Pt is asking for a refill on oxyCODONE-acetaminophen (PERCOCET/ROXICET) 5-325 MG tablet. He is still waiting on a pcp. ? ? ?

## 2021-12-30 NOTE — Telephone Encounter (Signed)
Last script was the final one for this issue - oxycodone is not an appropriate medication for her to take long term for arthritis.  She was encouraged to start physical therapy for her low back and if failed would go ahead with MRI of her lumbar spine - I don't see that she's started this but the referral is in.  She needs to see the PCP so we can go ahead with visco of her knees.  We also referred her to nutritionist. ?

## 2022-01-05 ENCOUNTER — Ambulatory Visit: Payer: Medicaid Other | Admitting: Family Medicine

## 2022-01-13 NOTE — Therapy (Incomplete)
OUTPATIENT PHYSICAL THERAPY THORACOLUMBAR EVALUATION   Patient Name: Sandra Orr MRN: [id number] DOB:1971/04/05, 51 y.o., female Today's Date: 01/13/2022    Past Medical History:  Diagnosis Date   Anxiety    Arthritis    Asthma    Back pain    Carpal tunnel syndrome    Depression    DM type 2 (diabetes mellitus, type 2) (HCC) 02/04/2015   A1c 6.5 (01/11/15)    GERD (gastroesophageal reflux disease)    Obesity    Past Surgical History:  Procedure Laterality Date   CHOLECYSTECTOMY     TUBAL LIGATION     Patient Active Problem List   Diagnosis Date Noted   Chronic obstructive pulmonary disease, unspecified COPD type (HCC) 10/06/2021   Primary localized osteoarthritis of knees, bilateral 12/22/2017   Chronic pain of left knee 08/03/2016   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   Abdominal pain 10/18/2015   Constipation 10/18/2015   Rash and nonspecific skin eruption 10/18/2015   Bilateral knee pain 07/15/2015   Carpal tunnel syndrome 07/15/2015   Homelessness 05/30/2015   Essential hypertension, benign 05/30/2015   Morbid obesity (HCC) 05/04/2012   Depression 05/04/2012   Chronic pain 05/04/2012   Tobacco abuse 05/04/2012    PCP: Pcp, No  REFERRING PROVIDER: Lenda Kelp, MD  REFERRING DIAG: M54.50,G89.29 (ICD-10-CM) - Chronic low back pain, unspecified back pain laterality, unspecified whether sciatica present  Rationale for Evaluation and Treatment Rehabilitation  THERAPY DIAG:  No diagnosis found.  ONSET DATE: ***  SUBJECTIVE:                                                                                                                                                                                           SUBJECTIVE STATEMENT: *** PERTINENT HISTORY:  ***  PAIN:  Are you having pain? Yes: {yespain:27235::"NPRS scale: ***/10","Pain location: ***","Pain description: ***","Aggravating factors: ***","Relieving factors:  ***"}   PRECAUTIONS: {Therapy precautions:24002}  WEIGHT BEARING RESTRICTIONS {Yes ***/No:24003}  FALLS:  Has patient fallen in last 6 months? {fallsyesno:27318}  LIVING ENVIRONMENT: Lives with: {OPRC lives with:25569::"lives with their family"} Lives in: {Lives in:25570} Stairs: {opstairs:27293} Has following equipment at home: {Assistive devices:23999}  OCCUPATION: ***  PLOF: {PLOF:24004}  PATIENT GOALS ***   OBJECTIVE:   DIAGNOSTIC FINDINGS:  IMPRESSION: Mild degenerative changes of the lumbar spine.     Electronically Signed   By: Meda Klinefelter MD   On: 03/09/2021 17:25 Xray pelvis IMPRESSION: Negative.     Electronically Signed   By: Meda Klinefelter MD   On: 03/09/2021 17:26  PATIENT SURVEYS:  {rehab surveys:24030}  SCREENING FOR RED FLAGS: Bowel or bladder incontinence: {  JSH/FW:263785885} Spinal tumors: {Yes/No:304960894} Cauda equina syndrome: {Yes/No:304960894} Compression fracture: {Yes/No:304960894} Abdominal aneurysm: {Yes/No:304960894}  COGNITION:  Overall cognitive status: {cognition:24006}     SENSATION: {sensation:27233}  MUSCLE LENGTH: Hamstrings: Right *** deg; Left *** deg Maisie Fus test: Right *** deg; Left *** deg  POSTURE: {posture:25561}  PALPATION: ***  LUMBAR ROM:   {AROM/PROM:27142}  A/PROM  eval  Flexion   Extension   Right lateral flexion   Left lateral flexion   Right rotation   Left rotation    (Blank rows = not tested)  LOWER EXTREMITY ROM:     {AROM/PROM:27142}  Right eval Left eval  Hip flexion    Hip extension    Hip abduction    Hip adduction    Hip internal rotation    Hip external rotation    Knee flexion    Knee extension    Ankle dorsiflexion    Ankle plantarflexion    Ankle inversion    Ankle eversion     (Blank rows = not tested)  LOWER EXTREMITY MMT:    MMT Right eval Left eval  Hip flexion    Hip extension    Hip abduction    Hip adduction    Hip internal rotation     Hip external rotation    Knee flexion    Knee extension    Ankle dorsiflexion    Ankle plantarflexion    Ankle inversion    Ankle eversion     (Blank rows = not tested)  LUMBAR SPECIAL TESTS:  {lumbar special test:25242}  FUNCTIONAL TESTS:  {Functional tests:24029}  GAIT: Distance walked: *** Assistive device utilized: {Assistive devices:23999} Level of assistance: {Levels of assistance:24026} Comments: ***    TODAY'S TREATMENT  ***   PATIENT EDUCATION:  Education details: *** Person educated: {Person educated:25204} Education method: {Education Method:25205} Education comprehension: {Education Comprehension:25206}   HOME EXERCISE PROGRAM: ***  ASSESSMENT:  CLINICAL IMPRESSION: Patient is a *** y.o. *** who was seen today for physical therapy evaluation and treatment for ***.    OBJECTIVE IMPAIRMENTS {opptimpairments:25111}.   ACTIVITY LIMITATIONS {activitylimitations:27494}  PARTICIPATION LIMITATIONS: {participationrestrictions:25113}  PERSONAL FACTORS {Personal factors:25162} are also affecting patient's functional outcome.   REHAB POTENTIAL: {rehabpotential:25112}  CLINICAL DECISION MAKING: {clinical decision making:25114}  EVALUATION COMPLEXITY: {Evaluation complexity:25115}   GOALS: Goals reviewed with patient? {yes/no:20286}  SHORT TERM GOALS: Target date: {follow up:25551}  *** Baseline: Goal status: {GOALSTATUS:25110}  2.  *** Baseline:  Goal status: {GOALSTATUS:25110}  3.  *** Baseline:  Goal status: {GOALSTATUS:25110}  4.  *** Baseline:  Goal status: {GOALSTATUS:25110}  5.  *** Baseline:  Goal status: {GOALSTATUS:25110}  6.  *** Baseline:  Goal status: {GOALSTATUS:25110}  LONG TERM GOALS: Target date: {follow up:25551}  *** Baseline:  Goal status: {GOALSTATUS:25110}  2.  *** Baseline:  Goal status: {GOALSTATUS:25110}  3.  *** Baseline:  Goal status: {GOALSTATUS:25110}  4.  *** Baseline:  Goal status:  {GOALSTATUS:25110}  5.  *** Baseline:  Goal status: {GOALSTATUS:25110}  6.  *** Baseline:  Goal status: {GOALSTATUS:25110}   PLAN: PT FREQUENCY: {rehab frequency:25116}  PT DURATION: {rehab duration:25117}  PLANNED INTERVENTIONS: {rehab planned interventions:25118::"Therapeutic exercises","Therapeutic activity","Neuromuscular re-education","Balance training","Gait training","Patient/Family education","Joint mobilization"}.  PLAN FOR NEXT SESSION: ***   Joellyn Rued, PT 01/13/2022, 9:54 PM

## 2022-01-14 ENCOUNTER — Ambulatory Visit: Payer: Medicaid Other | Attending: Family Medicine

## 2022-03-19 ENCOUNTER — Ambulatory Visit: Payer: Medicaid Other | Admitting: Family Medicine

## 2022-06-10 ENCOUNTER — Ambulatory Visit: Payer: Medicaid Other | Admitting: Family Medicine

## 2022-06-21 DIAGNOSIS — M545 Low back pain, unspecified: Secondary | ICD-10-CM | POA: Insufficient documentation

## 2022-06-21 DIAGNOSIS — J449 Chronic obstructive pulmonary disease, unspecified: Secondary | ICD-10-CM | POA: Insufficient documentation

## 2022-06-21 DIAGNOSIS — Z7952 Long term (current) use of systemic steroids: Secondary | ICD-10-CM | POA: Insufficient documentation

## 2022-06-21 DIAGNOSIS — M25562 Pain in left knee: Secondary | ICD-10-CM | POA: Diagnosis not present

## 2022-06-21 DIAGNOSIS — M25561 Pain in right knee: Secondary | ICD-10-CM | POA: Insufficient documentation

## 2022-06-22 ENCOUNTER — Emergency Department (HOSPITAL_COMMUNITY)
Admission: EM | Admit: 2022-06-22 | Discharge: 2022-06-22 | Disposition: A | Discharge status: Home / Self Care | Payer: Medicaid Other | Attending: Emergency Medicine | Admitting: Emergency Medicine

## 2022-06-22 ENCOUNTER — Encounter (HOSPITAL_COMMUNITY): Payer: Self-pay | Admitting: Emergency Medicine

## 2022-06-22 ENCOUNTER — Other Ambulatory Visit: Payer: Self-pay

## 2022-06-22 DIAGNOSIS — M545 Low back pain, unspecified: Secondary | ICD-10-CM

## 2022-06-22 DIAGNOSIS — M25561 Pain in right knee: Secondary | ICD-10-CM

## 2022-06-22 MED ORDER — OXYCODONE-ACETAMINOPHEN 5-325 MG PO TABS
2.0000 | ORAL_TABLET | Freq: Once | ORAL | Status: AC
  Administered 2022-06-22: 2 via ORAL
  Filled 2022-06-22: qty 2

## 2022-06-22 MED ORDER — PREDNISONE 10 MG PO TABS: 20.0000 mg | ORAL_TABLET | Freq: Two times a day (BID) | ORAL | 0 refills | Status: AC

## 2022-06-22 MED ORDER — PREDNISONE 20 MG PO TABS
40.0000 mg | ORAL_TABLET | Freq: Once | ORAL | Status: AC
  Administered 2022-06-22: 40 mg via ORAL
  Filled 2022-06-22: qty 2

## 2022-06-22 MED ORDER — OXYCODONE-ACETAMINOPHEN 5-325 MG PO TABS: 1.0000 | ORAL_TABLET | Freq: Four times a day (QID) | ORAL | 0 refills | Status: DC | PRN

## 2022-06-22 NOTE — ED Triage Notes (Signed)
Pt with c/o chronic knee pain bilaterally that has "gotten worse" and that she can "barely stand up". Pt also c/o intermittent shooting back pain.

## 2022-06-22 NOTE — Discharge Instructions (Signed)
Begin taking prednisone as prescribed.  Begin taking Percocet as prescribed as needed for pain.  Follow-up with your orthopedist/primary doctor if symptoms or not improving in the next few days.

## 2022-06-22 NOTE — ED Provider Notes (Signed)
[facility] EMERGENCY DEPARTMENT Provider Note   CSN: [id number] Arrival date & time: 06/21/22  2358     History  No chief complaint on file.   Sandra Orr is a 51 y.o. female.  Patient is a 51 year old female with past medical history of osteoarthritis with chronic pain, COPD, obesity.  Patient presenting today with complaints of bilateral knee and low back pain.  This began in the absence of any specific injury or trauma.  She has received steroid injections in the past by her orthopedist, but has not had transportation to make it there recently.  She denies any fevers or chills.  Pain is worse when she attempts to ambulate and there are no alleviating factors.  The history is provided by the patient.       Home Medications Prior to Admission medications   Medication Sig Start Date End Date Taking? Authorizing Provider  albuterol (VENTOLIN HFA) 108 (90 Base) MCG/ACT inhaler Inhale 2 puffs into the lungs every 6 (six) hours as needed for wheezing or shortness of breath. 09/09/20   Soyla Dryer, PA-C  oxyCODONE-acetaminophen (PERCOCET/ROXICET) 5-325 MG tablet Take 1 tablet by mouth every 4 (four) hours as needed for severe pain. 12/03/21   Hudnall, Sharyn Lull, MD  predniSONE (DELTASONE) 10 MG tablet 6 tabs po days 1-2, 5 tabs po days 3-4, 4 tabs po days 5-6, 3 tabs po days 7-8, 2 tabs po days 9-10, 1 tab po days 11-12 11/05/21   Hudnall, Sharyn Lull, MD      Allergies    Penicillins    Review of Systems   Review of Systems  All other systems reviewed and are negative.   Physical Exam Updated Vital Signs BP 107/64 (BP Location: Right Wrist)   Pulse 70   Temp 98.3 F (36.8 C) (Oral)   Resp 18   Ht 5\' 6"  (1.676 m)   Wt (!) 152 kg   LMP 05/06/2016   SpO2 95%   BMI 54.07 kg/m  Physical Exam Vitals and nursing note reviewed.  Constitutional:      General: She is not in acute distress.    Appearance: Normal appearance. She is not ill-appearing.  HENT:     Head:  Normocephalic and atraumatic.  Pulmonary:     Effort: Pulmonary effort is normal.  Musculoskeletal:     Comments: Bilateral knees are grossly normal in appearance.  There is no palpable effusion.  She has good range of motion with no crepitus.    Skin:    General: Skin is warm and dry.  Neurological:     Mental Status: She is alert and oriented to person, place, and time.    ED Results / Procedures / Treatments   Labs (all labs ordered are listed, but only abnormal results are displayed) Labs Reviewed - No data to display  EKG None  Radiology No results found.  Procedures Procedures    Medications Ordered in ED Medications  predniSONE (DELTASONE) tablet 40 mg (has no administration in time range)  oxyCODONE-acetaminophen (PERCOCET/ROXICET) 5-325 MG per tablet 2 tablet (has no administration in time range)    ED Course/ Medical Decision Making/ A&P  Patient presenting with complaints of low back and knee pain and presents with what seems like an exacerbation of chronic pain.  Vitals are stable and physical examination reveals no red flags.  I feel as though it is reasonable to try a course of a steroid along with medication for pain.  Final Clinical Impression(s) /  ED Diagnoses Final diagnoses:  None    Rx / DC Orders ED Discharge Orders     None         Geoffery Lyons, MD 06/22/22 5945

## 2022-07-11 ENCOUNTER — Encounter (HOSPITAL_COMMUNITY): Payer: Self-pay

## 2022-07-11 ENCOUNTER — Other Ambulatory Visit: Payer: Self-pay

## 2022-07-11 ENCOUNTER — Emergency Department (HOSPITAL_COMMUNITY)
Admission: EM | Admit: 2022-07-11 | Discharge: 2022-07-11 | Disposition: A | Discharge status: Home / Self Care | Payer: Medicaid Other | Attending: Emergency Medicine | Admitting: Emergency Medicine

## 2022-07-11 DIAGNOSIS — Z7951 Long term (current) use of inhaled steroids: Secondary | ICD-10-CM | POA: Insufficient documentation

## 2022-07-11 DIAGNOSIS — R197 Diarrhea, unspecified: Secondary | ICD-10-CM | POA: Insufficient documentation

## 2022-07-11 DIAGNOSIS — J449 Chronic obstructive pulmonary disease, unspecified: Secondary | ICD-10-CM | POA: Diagnosis not present

## 2022-07-11 DIAGNOSIS — K6289 Other specified diseases of anus and rectum: Secondary | ICD-10-CM | POA: Insufficient documentation

## 2022-07-11 MED ORDER — HYDROCORTISONE (PERIANAL) 2.5 % EX CREA: 1.0000 | TOPICAL_CREAM | Freq: Two times a day (BID) | CUTANEOUS | 0 refills | Status: AC

## 2022-07-11 NOTE — ED Triage Notes (Signed)
Pt arrived from work via [employer] [facility] w c/o diarrhea x 5 times in the last hour. Pt states that her rectum is swollen and sore.

## 2022-07-11 NOTE — Discharge Instructions (Signed)
Apply Anusol cream to the tissues surrounding your rectum twice daily.  Follow-up with primary doctor if not improving.

## 2022-07-11 NOTE — ED Provider Notes (Signed)
[facility] EMERGENCY DEPARTMENT Provider Note   CSN: [id number] Arrival date & time: 07/11/22  0141     History  Chief Complaint  Patient presents with   Diarrhea   Rectal Pain    Sandra Orr is a 51 y.o. female.  Patient is a 51 year old female with past medical history of COPD, osteoarthritis, chronic back pain, obesity.  Patient presenting today for evaluation of rectal pain.  Patient reports several episodes of diarrhea in the hour before calling 911 for her sore rectum.  She describes burning to the tissue around her anus since having the diarrhea.  She denies any bloody stool.  She denies any diarrhea.  She denies any abdominal pain.  Patient was transported to the emergency department uneventfully for evaluation of her rectal pain.  The history is provided by the patient.       Home Medications Prior to Admission medications   Medication Sig Start Date End Date Taking? Authorizing Provider  albuterol (VENTOLIN HFA) 108 (90 Base) MCG/ACT inhaler Inhale 2 puffs into the lungs every 6 (six) hours as needed for wheezing or shortness of breath. 09/09/20   Jacquelin Hawking, PA-C  oxyCODONE-acetaminophen (PERCOCET/ROXICET) 5-325 MG tablet Take 1 tablet by mouth every 6 (six) hours as needed for severe pain. 06/22/22   Geoffery Lyons, MD  predniSONE (DELTASONE) 10 MG tablet Take 2 tablets (20 mg total) by mouth 2 (two) times daily. 06/22/22   Geoffery Lyons, MD      Allergies    Penicillins    Review of Systems   Review of Systems  All other systems reviewed and are negative.   Physical Exam Updated Vital Signs BP 123/73   Pulse 61   Temp 98.6 F (37 C) (Oral)   Resp 15   LMP 05/06/2016   SpO2 98%  Physical Exam Vitals and nursing note reviewed.  Constitutional:      General: She is not in acute distress.    Appearance: Normal appearance. She is not ill-appearing.  HENT:     Head: Normocephalic and atraumatic.  Pulmonary:     Effort: Pulmonary effort is  normal.  Abdominal:     General: There is no distension.     Tenderness: There is no abdominal tenderness.  Genitourinary:    Comments: There is perhaps mild inflammation to the perianal tissues.  There are 2 small skin tag-like hemorrhoids that do not appear inflamed or swollen.  DRE reveals no significant discomfort or palpable masses.  There are no obvious fissures. Skin:    General: Skin is warm and dry.  Neurological:     Mental Status: She is alert.     ED Results / Procedures / Treatments   Labs (all labs ordered are listed, but only abnormal results are displayed) Labs Reviewed - No data to display  EKG None  Radiology No results found.  Procedures Procedures    Medications Ordered in ED Medications - No data to display  ED Course/ Medical Decision Making/ A&P  Patient brought by EMS for evaluation of rectal pain that started after having several episodes of diarrhea.  The perianal tissues appear mildly inflamed, but I see no evidence for fissure, hemorrhoid, abscess, or other abnormality.  I will prescribe hydrocortisone cream which the patient can apply to her perianal tissues and see if this helps with the inflammation.  She appears well-hydrated and only had diarrhea for 1 hour prior to coming to the ER.  I do not feel as though any  fluids or workup is indicated in this situation.  Final Clinical Impression(s) / ED Diagnoses Final diagnoses:  None    Rx / DC Orders ED Discharge Orders     None         Geoffery Lyons, MD 07/11/22 [phone]

## 2022-09-27 ENCOUNTER — Emergency Department (HOSPITAL_COMMUNITY)
Admission: EM | Admit: 2022-09-27 | Discharge: 2022-09-27 | Disposition: A | Discharge status: Home / Self Care | Payer: Medicaid Other | Attending: Emergency Medicine | Admitting: Emergency Medicine

## 2022-09-27 ENCOUNTER — Encounter (HOSPITAL_COMMUNITY): Payer: Self-pay | Admitting: *Deleted

## 2022-09-27 ENCOUNTER — Other Ambulatory Visit: Payer: Self-pay

## 2022-09-27 DIAGNOSIS — M25561 Pain in right knee: Secondary | ICD-10-CM | POA: Diagnosis present

## 2022-09-27 DIAGNOSIS — M170 Bilateral primary osteoarthritis of knee: Secondary | ICD-10-CM | POA: Insufficient documentation

## 2022-09-27 DIAGNOSIS — Z760 Encounter for issue of repeat prescription: Secondary | ICD-10-CM | POA: Insufficient documentation

## 2022-09-27 MED ORDER — OXYCODONE-ACETAMINOPHEN 5-325 MG PO TABS
2.0000 | ORAL_TABLET | Freq: Once | ORAL | Status: AC
  Administered 2022-09-27: 2 via ORAL
  Filled 2022-09-27: qty 2

## 2022-09-27 MED ORDER — IBUPROFEN 800 MG PO TABS
800.0000 mg | ORAL_TABLET | Freq: Once | ORAL | Status: AC
  Administered 2022-09-27: 800 mg via ORAL
  Filled 2022-09-27: qty 1

## 2022-09-27 MED ORDER — OXYCODONE-ACETAMINOPHEN 5-325 MG PO TABS: 1.0000 | ORAL_TABLET | Freq: Three times a day (TID) | ORAL | 0 refills | Status: DC | PRN

## 2022-09-27 MED ORDER — IBUPROFEN 800 MG PO TABS: 800.0000 mg | ORAL_TABLET | Freq: Three times a day (TID) | ORAL | 0 refills | Status: AC | PRN

## 2022-09-27 NOTE — ED Provider Notes (Signed)
[facility] Provider Note   CSN: [id number] Arrival date & time: 09/27/22  [id number]     History  Chief Complaint  Patient presents with   Medication Refill    Sandra Orr is a 52 y.o. female with a history including bilateral knee, hip and low back pain which has been chronic and is associated with diagnosis of osteoarthritis presenting for evaluation of increased pain specifically in her bilateral knees.  Her pain is currently worse in the right knee, stating the degree of arthritis in this knee is significantly more than the left.  She denies any injuries, reports that her knee pain tends to get worse during the winter months and with weather changes.  She has been taking ibuprofen 800 mg twice daily and uses Percocet up to 2 tablets twice daily, stating she generally does not need this medication during warm weather, but over the past week with the cold and also endorsing having to be on her feet more than typical her pain is escalated.  She denies weakness or numbness in her legs, no increased swelling, redness, as mentioned no injuries.  She took her last medications 2 days ago.  She is currently without transportation and had been for a while therefore was released from the care of her prior orthopedist Dr. Barbaraann Barthel.  She is attempting to establish local care with Dr. Luna Glasgow.  The history is provided by the patient.       Home Medications Prior to Admission medications   Medication Sig Start Date End Date Taking? Authorizing Provider  ibuprofen (ADVIL) 800 MG tablet Take 1 tablet (800 mg total) by mouth every 8 (eight) hours as needed for moderate pain. 09/27/22  Yes Gurfateh Mcclain, Almyra Free, PA-C  oxyCODONE-acetaminophen (PERCOCET/ROXICET) 5-325 MG tablet Take 1 tablet by mouth every 8 (eight) hours as needed for severe pain. 09/27/22  Yes Sukhraj Esquivias, Almyra Free, PA-C  albuterol (VENTOLIN HFA) 108 (90 Base) MCG/ACT inhaler Inhale 2 puffs into the lungs every 6  (six) hours as needed for wheezing or shortness of breath. 09/09/20   Soyla Dryer, PA-C  hydrocortisone (ANUSOL-HC) 2.5 % rectal cream Place 1 Application rectally 2 (two) times daily. 07/11/22   Veryl Speak, MD  predniSONE (DELTASONE) 10 MG tablet Take 2 tablets (20 mg total) by mouth 2 (two) times daily. 06/22/22   Veryl Speak, MD      Allergies    Penicillins    Review of Systems   Review of Systems  Constitutional:  Negative for fever.  Musculoskeletal:  Positive for arthralgias. Negative for joint swelling and myalgias.  Neurological:  Negative for weakness and numbness.  All other systems reviewed and are negative.   Physical Exam Updated Vital Signs BP (!) 149/89 (BP Location: Left Arm)   Pulse (!) 55   Temp 98.4 F (36.9 C) (Oral)   Resp 16   Ht 5' 5"$  (1.651 m)   Wt (!) 145.2 kg   LMP 05/06/2016   SpO2 100%   BMI 53.25 kg/m  Physical Exam Constitutional:      Appearance: She is well-developed.  HENT:     Head: Atraumatic.  Cardiovascular:     Pulses:          Dorsalis pedis pulses are 2+ on the right side and 2+ on the left side.     Comments: Pulses equal bilaterally Musculoskeletal:        General: Tenderness present. No deformity.     Cervical back: Normal range  of motion.     Right knee: Bony tenderness present. No swelling, deformity or erythema. Normal alignment.     Left knee: No swelling, deformity or erythema. Normal alignment.  Skin:    General: Skin is warm and dry.     Findings: No erythema.  Neurological:     Mental Status: She is alert.     Sensory: No sensory deficit.     Motor: No weakness.     Deep Tendon Reflexes: Reflexes normal.     ED Results / Procedures / Treatments   Labs (all labs ordered are listed, but only abnormal results are displayed) Labs Reviewed - No data to display  EKG None  Radiology No results found.  Procedures Procedures    Medications Ordered in ED Medications  ibuprofen (ADVIL) tablet 800  mg (has no administration in time range)  oxyCODONE-acetaminophen (PERCOCET/ROXICET) 5-325 MG per tablet 2 tablet (has no administration in time range)    ED Course/ Medical Decision Making/ A&P                             Medical Decision Making Patient with chronic bilateral knee pain with known osteoarthritis, worse with recent weather changes.  Is asking for a refill of her ibuprofen and her Percocet which she uses very sparingly.  She is given a 30-day supply of ibuprofen, 5-day supply of Percocet, advised to use sparingly as this will need to be her last narcotic refill from the emergency department, she is motivated to establish care with Dr. Luna Glasgow which she was encouraged to do.  She is also given referral to Dr. Percell Miller who is on-call for [place] today in the event she is unable to establish with Dr. Luna Glasgow.  Amount and/or Complexity of Data Reviewed Radiology:     Details: Not indicated  Risk Prescription drug management.           Final Clinical Impression(s) / ED Diagnoses Final diagnoses:  Primary osteoarthritis of both knees    Rx / DC Orders ED Discharge Orders          Ordered    ibuprofen (ADVIL) 800 MG tablet  Every 8 hours PRN        09/27/22 0921    oxyCODONE-acetaminophen (PERCOCET/ROXICET) 5-325 MG tablet  Every 8 hours PRN        09/27/22 0921              Evalee Jefferson, PA-C 09/27/22 0932    Davonna Belling, MD 09/27/22 [phone]

## 2022-09-27 NOTE — ED Triage Notes (Signed)
Pt c/o chronic bilateral knee and hip pain. Pt reports she was seeing a doctor for this pain and was supposed to have reconstructive surgery on her knees but because she missed so many appts due to her car breaking down they dropped her. Pt reports she last saw this doctor 4 months ago but ran out of her meds 2 days ago. Pt reports she was taking Ibuprofen 860m twice daily, Percocet 5/321m2 tabs twice daily. Pt has been working with Social Services to get transportation set up so she can find another doctor but she hasn't called to set it up yet. Pt is requesting to have this two medications refilled.

## 2022-09-27 NOTE — Discharge Instructions (Signed)
You have been prescribed a small quantity of your pain medication, any further narcotic prescriptions will need to come from either your primary doctor or your orthopedist.  Do not drive within 4 hours of taking this medication as it will make you drowsy.  Other modalities that can help with arthritis pain include elevation in warm compresses.  You may also apply ice if you have had to be on your feet for a long period and you feel the knees are swollen from exertion.

## 2022-10-07 ENCOUNTER — Ambulatory Visit: Payer: Medicaid Other | Admitting: Orthopaedic Surgery

## 2022-10-15 ENCOUNTER — Encounter: Payer: Self-pay | Admitting: Radiology

## 2023-07-28 ENCOUNTER — Ambulatory Visit (INDEPENDENT_AMBULATORY_CARE_PROVIDER_SITE_OTHER): Payer: 59 | Admitting: Family Medicine

## 2023-07-28 VITALS — BP 110/74 | Ht 64.0 in | Wt 340.0 lb

## 2023-07-28 DIAGNOSIS — G8929 Other chronic pain: Secondary | ICD-10-CM

## 2023-07-28 DIAGNOSIS — M25562 Pain in left knee: Secondary | ICD-10-CM

## 2023-07-28 DIAGNOSIS — M25561 Pain in right knee: Secondary | ICD-10-CM | POA: Diagnosis not present

## 2023-07-28 MED ORDER — GABAPENTIN 300 MG PO CAPS: 300.0000 mg | ORAL_CAPSULE | Freq: Three times a day (TID) | ORAL | 2 refills | Status: DC

## 2023-07-28 NOTE — Patient Instructions (Signed)
Your pain is due to arthritis. Start gabapentin 300mg  at bedtime. After a few days start taking it twice a day.  After a few more days increase to 300mg  three times a day. These are the different medications you can take for this: Tylenol 500mg  1-2 tabs three times a day for pain. Voltaren gel, capsaicin, aspercreme, or biofreeze topically up to four times a day may also help with pain. Some supplements that may help for arthritis: Boswellia extract, curcumin, pycnogenol Aleve 1-2 tabs twice a day with food Cortisone injections are an option. If cortisone injections do not help, there are different types of shots that may help but they take longer to take effect - we will look into these gel injections for you. It's important that you continue to stay active. Straight leg raises, knee extensions 3 sets of 10 once a day (add ankle weight if these become too easy). Consider physical therapy to strengthen muscles around the joint that hurts to take pressure off of the joint itself. Shoe inserts with good arch support may be helpful. Heat or ice 15 minutes at a time 3-4 times a day as needed to help with pain. Water aerobics and cycling with low resistance are the best two types of exercise for arthritis though any exercise is ok as long as it doesn't worsen the pain.

## 2023-07-29 NOTE — Progress Notes (Signed)
PCP: Pcp, No  Subjective:   HPI: Patient is a 52 y.o. female here for bilateral knee pain.  Patient with known history of arthritis of both knees. Had steroid injections in past and last ones did not last very long. Interested in viscosupplementation but does not want steroid injections today. Pain worse walking in both knees. No new injuries. Ibuprofen has not been helping.  Past Medical History:  Diagnosis Date   Anxiety    Arthritis    Asthma    Back pain    Carpal tunnel syndrome    Depression    DM type 2 (diabetes mellitus, type 2) (HCC) 02/04/2015   A1c 6.5 (01/11/15)    GERD (gastroesophageal reflux disease)    Obesity     Current Outpatient Medications on File Prior to Visit  Medication Sig Dispense Refill   albuterol (VENTOLIN HFA) 108 (90 Base) MCG/ACT inhaler Inhale 2 puffs into the lungs every 6 (six) hours as needed for wheezing or shortness of breath. 3 each 0   hydrocortisone (ANUSOL-HC) 2.5 % rectal cream Place 1 Application rectally 2 (two) times daily. 30 g 0   ibuprofen (ADVIL) 800 MG tablet Take 1 tablet (800 mg total) by mouth every 8 (eight) hours as needed for moderate pain. 21 tablet 0   oxyCODONE-acetaminophen (PERCOCET/ROXICET) 5-325 MG tablet Take 1 tablet by mouth every 8 (eight) hours as needed for severe pain. 15 tablet 0   predniSONE (DELTASONE) 10 MG tablet Take 2 tablets (20 mg total) by mouth 2 (two) times daily. 20 tablet 0   No current facility-administered medications on file prior to visit.    Past Surgical History:  Procedure Laterality Date   CHOLECYSTECTOMY     TUBAL LIGATION      Allergies  Allergen Reactions   Penicillins Hives    Has patient had a PCN reaction causing immediate rash, facial/tongue/throat swelling, SOB or lightheadedness with hypotension: No Has patient had a PCN reaction causing severe rash involving mucus membranes or skin necrosis: No Has patient had a PCN reaction that required hospitalization No Has  patient had a PCN reaction occurring within the last 10 years: No If all of the above answers are "NO", then may proceed with Cephalosporin use.     BP 110/74   Ht 5\' 4"  (1.626 m)   Wt (!) 340 lb (154.2 kg)   LMP 05/06/2016   BMI 58.36 kg/m      06/09/2021    1:47 PM  [facility] Adult Exercise  Frequency of aerobic exercise (# of days/week) 0  Average time in minutes 0  Frequency of strengthening activities (# of days/week) 0        No data to display              Objective:  Physical Exam:  Gen: NAD, comfortable in exam room  Bilateral knees: No gross deformity, ecchymoses, obvious effusion. TTP medial and lateral joint lines. ROM 0 - 100 degrees.  Normal strength. Negative ant/post drawers. Negative valgus/varus testing. Negative lachman. Negative mcmurrays, apleys.  NV intact distally.   Assessment & Plan:  1. Bilateral knee pain - 2/2 known arthritis.  Tylenol, topical medications, supplements, aleve reviewed.  Steroid injections haven't provided long lasting benefit beyond several days - will look into visco injections.  Quad strengthening heat or ice.

## 2023-08-04 NOTE — Progress Notes (Signed)
Pt benefits for orthovisc below. Her secondary will pick up remaining costs at 100%. PA required. Sent to [employer]. Will contact pt once insurance approves the medicine and will discuss scheduling at that time.

## 2023-08-19 ENCOUNTER — Ambulatory Visit
Admission: EM | Admit: 2023-08-19 | Discharge: 2023-08-19 | Disposition: A | Discharge status: Home / Self Care | Payer: 59 | Attending: Physician Assistant | Admitting: Physician Assistant

## 2023-08-19 DIAGNOSIS — J4520 Mild intermittent asthma, uncomplicated: Secondary | ICD-10-CM | POA: Diagnosis not present

## 2023-08-19 MED ORDER — ALBUTEROL SULFATE HFA 108 (90 BASE) MCG/ACT IN AERS: 2.0000 | INHALATION_SPRAY | Freq: Four times a day (QID) | RESPIRATORY_TRACT | 2 refills | Status: AC | PRN

## 2023-08-19 MED ORDER — ALBUTEROL SULFATE HFA 108 (90 BASE) MCG/ACT IN AERS
2.0000 | INHALATION_SPRAY | RESPIRATORY_TRACT | Status: DC
  Administered 2023-08-19: 2 via RESPIRATORY_TRACT

## 2023-08-19 NOTE — Discharge Instructions (Addendum)
 Return if any problems.

## 2023-08-19 NOTE — ED Triage Notes (Signed)
 Patient states she need a breathing treatment, "can't hardly breathe" Symptoms started 2 days ago.

## 2023-08-19 NOTE — ED Provider Notes (Signed)
 [facility]    CSN: [id number] Arrival date & time: 08/19/23  1509      History   Chief Complaint No chief complaint on file.   HPI Sandra Orr is a 53 y.o. female.   Pt complains of shortness of breath.  Pt reports she is out of albuterol  inhaler.  Pt used her inhaler last night and used last dose.  Pt reports she needs a refill of albuterol    The history is provided by the patient. No language interpreter was used.    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   Abdominal pain 10/18/2015   Constipation 10/18/2015   Rash and nonspecific skin eruption 10/18/2015   Bilateral knee pain 07/15/2015   Carpal tunnel syndrome 07/15/2015   Homelessness 05/30/2015   Essential hypertension, benign 05/30/2015   Morbid obesity (HCC) 05/04/2012   Depression 05/04/2012   Chronic pain 05/04/2012   Tobacco abuse 05/04/2012    Past Surgical History:  Procedure Laterality Date   CHOLECYSTECTOMY     TUBAL LIGATION      OB History     Gravida  3   Para  3   Term  3   Preterm      AB      Living  3      SAB      IAB      Ectopic      Multiple      Live Births  3            Home Medications    Prior to Admission medications   Medication Sig Start Date End Date Taking? Authorizing Provider  albuterol  (VENTOLIN  HFA) 108 (90 Base) MCG/ACT inhaler Inhale 2 puffs into the lungs every 6 (six) hours as needed for wheezing or shortness of breath.  08/19/23  Yes Flint Raring K, PA-C  gabapentin  (NEURONTIN ) 300 MG capsule Take 1 capsule (300 mg total) by mouth 3 (three) times daily. 07/28/23   Hudnall, Ludie SAUNDERS, MD  hydrocortisone  (ANUSOL -HC) 2.5 % rectal cream Place 1 Application rectally 2 (two) times daily. 07/11/22   Geroldine Berg, MD  ibuprofen  (ADVIL ) 800 MG tablet Take 1 tablet (800 mg total) by mouth every 8 (eight) hours as needed for moderate pain. 09/27/22   Idol, Julie, PA-C  oxyCODONE -acetaminophen  (PERCOCET/ROXICET) 5-325 MG tablet Take 1 tablet by mouth every 8 (eight) hours as needed for severe pain. 09/27/22   Idol, Julie, PA-C  predniSONE  (DELTASONE ) 10 MG tablet Take 2 tablets (20 mg total) by mouth 2 (two) times daily. 06/22/22   Geroldine Berg, MD    Family History Family History  Problem Relation Age of Onset   Depression Mother        bipolar/schizo   Heart disease Mother    Diabetes Mother    Asthma Father    Hyperlipidemia Father    Depression Maternal Grandmother    Heart  disease Maternal Grandmother    Diabetes Maternal Grandmother    Hypertension Maternal Grandfather    Breast cancer Paternal Aunt     Social History Social History   Tobacco Use   Smoking status: Every Day    Current packs/day: 0.50    Average packs/day: 0.5 packs/day for 31.0 years (15.5 ttl pk-yrs)    Types: Cigarettes   Smokeless tobacco: Never  Vaping Use   Vaping status: Never Used  Substance Use Topics   Alcohol use: No    Alcohol/week: 0.0 standard drinks of alcohol   Drug use: Not Currently    Types: Cocaine    Comment: hx of, pt reports she has been clearn for almost a year now as of 09/27/22-Cocaine x 4-5 a week     Allergies   Penicillins   Review of Systems Review of Systems  Respiratory:  Positive for shortness of breath. Negative for wheezing.   All other systems reviewed and are negative.    Physical Exam Triage Vital Signs ED Triage Vitals  Encounter Vitals Group     BP 08/19/23 1640 (!) 135/103      Systolic BP Percentile --      Diastolic BP Percentile --      Pulse Rate 08/19/23 1640 (!) 56     Resp 08/19/23 1640 18     Temp 08/19/23 1640 98.6 F (37 C)     Temp Source 08/19/23 1640 Oral     SpO2 08/19/23 1640 100 %     Weight 08/19/23 1644 (!) 360 lb (163.3 kg)     Height 08/19/23 1644 5' 4 (1.626 m)     Head Circumference --      Peak Flow --      Pain Score 08/19/23 1644 0     Pain Loc --      Pain Education --      Exclude from Growth Chart --    No data found.  Updated Vital Signs BP (!) 135/103 (BP Location: Left Arm)   Pulse (!) 56   Temp 98.6 F (37 C) (Oral)   Resp 18   Ht 5' 4 (1.626 m)   Wt (!) 163.3 kg   LMP 05/06/2016   SpO2 100%   BMI 61.79 kg/m   Visual Acuity Right Eye Distance:   Left Eye Distance:   Bilateral Distance:    Right Eye Near:   Left Eye Near:    Bilateral Near:     Physical Exam Vitals and nursing note reviewed.  Constitutional:      Appearance: She is well-developed.  HENT:     Head: Normocephalic.     Mouth/Throat:     Mouth: Mucous membranes are moist.  Cardiovascular:     Rate and Rhythm: Normal rate.  Pulmonary:     Effort: Pulmonary effort is normal.  Abdominal:     General: There is no distension.  Musculoskeletal:        General: Normal range of motion.     Cervical back: Normal range of motion.  Skin:    General: Skin is warm.  Neurological:     General: No focal deficit present.     Mental Status: She is alert and oriented to person, place, and time.      UC Treatments / Results  Labs (all labs ordered are listed, but only abnormal results are displayed) Labs Reviewed - No data to display  EKG   Radiology No results found.  Procedures Procedures (including  critical care time)  Medications Ordered in UC Medications  albuterol  (VENTOLIN  HFA) 108 (90 Base) MCG/ACT inhaler 2 puff (2 puffs Inhalation Given 08/19/23 1721)    Initial Impression / Assessment and Plan / UC Course  I have  reviewed the triage vital signs and the nursing notes.  Pertinent labs & imaging results that were available during my care of the patient were reviewed by me and considered in my medical decision making (see chart for details).      Final Clinical Impressions(s) / UC Diagnoses   Final diagnoses:  Intermittent asthma, unspecified asthma severity, unspecified whether complicated     Discharge Instructions      Return if any problems.    ED Prescriptions     Medication Sig Dispense Auth. Provider   albuterol  (VENTOLIN  HFA) 108 (90 Base) MCG/ACT inhaler Inhale 2 puffs into the lungs every 6 (six) hours as needed for wheezing or shortness of breath. 8 g Mikah Poss K, PA-C      PDMP not reviewed this encounter. An After Visit Summary was printed and given to the patient.        Flint Sonny POUR, PA-C 08/19/23 1726

## 2023-08-24 ENCOUNTER — Telehealth: Payer: Self-pay

## 2023-08-24 NOTE — Telephone Encounter (Signed)
 Copied from CRM [id number]. Topic: General - Other >> Aug 24, 2023 11:49 AM Elle L wrote: Reason for CRM: The patient was dismissed from the practice due to a no show new patient appointment but wants to establish care at the practice. She states she has transportation now. Her call back number is [phone].

## 2023-08-26 NOTE — Telephone Encounter (Signed)
 Ok to establish care.

## 2023-08-30 NOTE — Telephone Encounter (Signed)
 Called patient to schedule, patient has already found another pcp

## 2023-09-17 ENCOUNTER — Telehealth: Payer: Self-pay

## 2023-09-17 NOTE — Telephone Encounter (Signed)
Regarding gel injections- we were able to get insurance authorization for Monovisc with Dr. Darene Lamer. Dr. Pearletha Forge was showing as out of network and they denied that claim initially. If pt would like to proceed with monovisc injections she will be scheduled with Dr. Christella Hartigan and be responsible for an $85 copay on the day of the visit. Per [employer] rep they will pick up remaining costs at 100%.

## 2023-10-18 ENCOUNTER — Ambulatory Visit: Payer: 59 | Admitting: Family Medicine

## 2023-11-18 ENCOUNTER — Ambulatory Visit (INDEPENDENT_AMBULATORY_CARE_PROVIDER_SITE_OTHER): Payer: MEDICAID | Admitting: Family Medicine

## 2023-11-18 ENCOUNTER — Encounter: Payer: Self-pay | Admitting: Family Medicine

## 2023-11-18 ENCOUNTER — Other Ambulatory Visit: Payer: Self-pay

## 2023-11-18 ENCOUNTER — Ambulatory Visit: Payer: MEDICAID | Admitting: Family Medicine

## 2023-11-18 VITALS — BP 153/72 | Ht 66.0 in | Wt 320.0 lb

## 2023-11-18 DIAGNOSIS — M7660 Achilles tendinitis, unspecified leg: Secondary | ICD-10-CM

## 2023-11-18 DIAGNOSIS — M25561 Pain in right knee: Secondary | ICD-10-CM

## 2023-11-18 DIAGNOSIS — M7742 Metatarsalgia, left foot: Secondary | ICD-10-CM

## 2023-11-18 DIAGNOSIS — G8929 Other chronic pain: Secondary | ICD-10-CM | POA: Diagnosis not present

## 2023-11-18 DIAGNOSIS — M170 Bilateral primary osteoarthritis of knee: Secondary | ICD-10-CM | POA: Diagnosis not present

## 2023-11-18 DIAGNOSIS — M25562 Pain in left knee: Secondary | ICD-10-CM | POA: Diagnosis not present

## 2023-11-18 MED ORDER — GABAPENTIN 300 MG PO CAPS: ORAL_CAPSULE | ORAL | 2 refills | Status: AC

## 2023-11-18 NOTE — Patient Instructions (Addendum)
 You should start taking Gabapentin 300mg  1 tab in the morning, 1 tab in the afternoon, and 2 tabs at bedtime  We will order the Gel injections for your knee (Monovisc) - we expect the medication to be available after Monday.  You can schedule a follow-up appointment for injections of both of your knees at your convenience.

## 2023-11-18 NOTE — Progress Notes (Signed)
 DATE OF VISIT: 11/18/2023    Sandra Orr DOB: 05/21/1971 MRN: [id number]  CC:  knee pain  History of present Illness: Sandra Orr is a 53 y.o. female who presents for a follow-up visit for b/l knee pain Last seen by Dr Pearletha Forge 07/28/23 - known knee OA - limited improvement with prior CSI injections - considering HA injections -- benefits run after last visit, has $85 copay.  Staff tried to contact her 09/17/23, but no voicemail setup - also given Rx Gabapentin 300mg  tid  Today she notes ongoing pain in both knees Again notes that prior cortisone injections have not been effective Has never had HA injections, benefits were run and authorization was obtained after last visit but we were never able to contact her to make her aware. No improvement with oral NSAIDs including ibuprofen, Aleve, Naprosyn No improvement with topical NSAIDs including Voltaren gel Has been taking gabapentin 300 mg 3 times daily with limited improvement Has used oxycodone and hydrocodone as prescribed by PCP in the past with some improvement.  She does not have any supply of this at this time  Also complaining of some left heel and left foot pain Notes a bump along her left Achilles Typically cannot wear sneakers she feels they are too heavy and uncomfortable for her knees Typically wears lives with reasonable support No prior foot or Achilles issues  Medications:  Outpatient Encounter Medications as of 11/18/2023  Medication Sig   albuterol (VENTOLIN HFA) 108 (90 Base) MCG/ACT inhaler Inhale 2 puffs into the lungs every 6 (six) hours as needed for wheezing or shortness of breath.   gabapentin (NEURONTIN) 300 MG capsule 1 tab in the morning, 1 tab lunch time, 2 tabs at bedtime   hydrocortisone (ANUSOL-HC) 2.5 % rectal cream Place 1 Application rectally 2 (two) times daily.   ibuprofen (ADVIL) 800 MG tablet Take 1 tablet (800 mg total) by mouth every 8 (eight) hours as needed for moderate pain.    oxyCODONE-acetaminophen (PERCOCET/ROXICET) 5-325 MG tablet Take 1 tablet by mouth every 8 (eight) hours as needed for severe pain.   predniSONE (DELTASONE) 10 MG tablet Take 2 tablets (20 mg total) by mouth 2 (two) times daily.   [DISCONTINUED] gabapentin (NEURONTIN) 300 MG capsule Take 1 capsule (300 mg total) by mouth 3 (three) times daily.   No facility-administered encounter medications on file as of 11/18/2023.    Allergies: is allergic to penicillins.  Physical Examination: Vitals: BP (!) 153/72   Ht 5\' 6"  (1.676 m)   Wt (!) 320 lb (145.2 kg)   LMP 05/06/2016   BMI 51.65 kg/m  GENERAL:  Sandra Orr is a 53 y.o. female appearing their stated age, alert and oriented x 3, in no apparent distress.  SKIN: no rashes or lesions, skin clean, dry, intact MSK: Knees: Bilateral knees without any gross deformity.  Morbidly obese.  Tender to palpation along medial and lateral joint lines.  Range of motion 100 degrees with pain.  Foot/ankle: Prominent nodule along distal aspect of the Achilles.  Mildly tender.  No palpable defects.  Full range of motion of the ankle with slight pain at terminal dorsiflexion.  No nodules on the right Pes planus with widening of the transverse arches.  Does have mild tenderness along the fourth and fifth metatarsal heads on the left Walking with an antalgic gait Neurovascularly intact distally  Radiology: Limited MSK ultrasound left Achilles Date 11/18/23 Indication: Left Achilles pain Findings: - Normal attachment of the Achilles to  the calcaneus -Prominent thickening of the Achilles approximately 2 cm proximal to the calcaneus.  Thickness measuring 1.06 centimeters.  Associated hypoechoic change.  No increased Doppler flow, no visible tearing  Impression: -Left Achilles tendinopathy Images and interpretation completed by Darene Lamer, DO today  RT knee XR 01/17/21 4 views personally reviewed and interpreted by me today showing: - severe medial and PF  joint space narrowing with advanced knee OA  Assessment & Plan Chronic pain of both knees Chronic bilateral knee pain with underlying severe osteoarthritis, limited improvement with prior cortisone injections, oral NSAIDs, topical NSAIDs.  No prior HA injections  Plan: -Diagnosis and treatment discussed -We did obtain prior authorization for Monovisc after her last visit.  We will place order for this, anticipate should be available by early next week.  She can schedule an appointment at her convenience for ultrasound-guided HA injections -Will increase her gabapentin to dosage.  Rx gabapentin 300 mg 1 tab every morning, 1 tab q. afternoon, 2 tabs at bedtime.  Counseled on risks of drowsiness -She was asking for refill of her oxycodone or hydrocodone.  She is primarily getting this from her PCP.  Advised his best to follow-up with PCP for this.  She expressed understanding and agreement Primary localized osteoarthritis of knees, bilateral Chronic bilateral knee pain with underlying severe osteoarthritis, limited improvement with prior cortisone injections, oral NSAIDs, topical NSAIDs.  No prior HA injections  Plan: -Diagnosis and treatment discussed -We did obtain prior authorization for Monovisc after her last visit.  We will place order for this, anticipate should be available by early next week.  She can schedule an appointment at her convenience for ultrasound-guided HA injections -She understands that definitive treatment would be a knee replacement, but her current weight precludes her from being able to do this since her BMI is 51.6 Achilles tendon pain Left Achilles pain with Achilles tendinopathy  Plan: -MSK ultrasound completed with findings as noted above -Suspect tendinopathy the result of altered gait from underlying osteoarthritis -Recommend she wear supportive footwear when possible, primarily wearing slides because that is most comfortable for her knees. -Will provide home  exercise program -Will assess progress at follow-up visit Metatarsalgia of left foot Metatarsalgia of the left foot along fourth and fifth metatarsal heads, likely related to altered gait from her knee osteoarthritis and Achilles tendinopathy  Plan: -Diagnosis and treatment discussed -Recommend she wear supportive footwear when possible, primarily wearing slides because that is most comfortable for her knees. -Increase gabapentin dosage as noted above to hopefully help with knee and foot pain -Can assess her progress when she follows up for the injections   Patient expressed understanding & agreement with above.  Encounter Diagnoses  Name Primary?   Chronic pain of both knees Yes   Primary localized osteoarthritis of knees, bilateral    Achilles tendon pain    Metatarsalgia of left foot     Orders Placed This Encounter  Procedures   [place] LIMITED JOINT SPACE STRUCTURES LOW LEFT

## 2023-11-18 NOTE — Assessment & Plan Note (Signed)
 Chronic bilateral knee pain with underlying severe osteoarthritis, limited improvement with prior cortisone injections, oral NSAIDs, topical NSAIDs.  No prior HA injections  Plan: -Diagnosis and treatment discussed -We did obtain prior authorization for Monovisc after her last visit.  We will place order for this, anticipate should be available by early next week.  She can schedule an appointment at her convenience for ultrasound-guided HA injections -Will increase her gabapentin to dosage.  Rx gabapentin 300 mg 1 tab every morning, 1 tab q. afternoon, 2 tabs at bedtime.  Counseled on risks of drowsiness -She was asking for refill of her oxycodone or hydrocodone.  She is primarily getting this from her PCP.  Advised his best to follow-up with PCP for this.  She expressed understanding and agreement

## 2023-11-18 NOTE — Assessment & Plan Note (Signed)
 Chronic bilateral knee pain with underlying severe osteoarthritis, limited improvement with prior cortisone injections, oral NSAIDs, topical NSAIDs.  No prior HA injections  Plan: -Diagnosis and treatment discussed -We did obtain prior authorization for Monovisc after her last visit.  We will place order for this, anticipate should be available by early next week.  She can schedule an appointment at her convenience for ultrasound-guided HA injections -She understands that definitive treatment would be a knee replacement, but her current weight precludes her from being able to do this since her BMI is 51.6

## 2023-12-14 ENCOUNTER — Ambulatory Visit: Admitting: Family Medicine

## 2024-01-20 ENCOUNTER — Other Ambulatory Visit: Payer: Self-pay

## 2024-01-20 ENCOUNTER — Emergency Department (HOSPITAL_COMMUNITY)
Admission: EM | Admit: 2024-01-20 | Discharge: 2024-01-20 | Disposition: A | Discharge status: Home / Self Care | Payer: MEDICAID | Attending: Emergency Medicine | Admitting: Emergency Medicine

## 2024-01-20 ENCOUNTER — Encounter (HOSPITAL_COMMUNITY): Payer: Self-pay

## 2024-01-20 ENCOUNTER — Emergency Department (HOSPITAL_COMMUNITY): Payer: MEDICAID

## 2024-01-20 DIAGNOSIS — E119 Type 2 diabetes mellitus without complications: Secondary | ICD-10-CM | POA: Diagnosis not present

## 2024-01-20 DIAGNOSIS — M25561 Pain in right knee: Secondary | ICD-10-CM | POA: Insufficient documentation

## 2024-01-20 DIAGNOSIS — K921 Melena: Secondary | ICD-10-CM | POA: Diagnosis present

## 2024-01-20 DIAGNOSIS — M25562 Pain in left knee: Secondary | ICD-10-CM | POA: Insufficient documentation

## 2024-01-20 DIAGNOSIS — M25551 Pain in right hip: Secondary | ICD-10-CM | POA: Diagnosis not present

## 2024-01-20 DIAGNOSIS — N3001 Acute cystitis with hematuria: Secondary | ICD-10-CM

## 2024-01-20 DIAGNOSIS — J45909 Unspecified asthma, uncomplicated: Secondary | ICD-10-CM | POA: Insufficient documentation

## 2024-01-20 LAB — CBC WITH DIFFERENTIAL/PLATELET
Abs Immature Granulocytes: 0.02 10*3/uL (ref 0.00–0.07)
Basophils Absolute: 0.1 10*3/uL (ref 0.0–0.1)
Basophils Relative: 1 %
Eosinophils Absolute: 0.1 10*3/uL (ref 0.0–0.5)
Eosinophils Relative: 1 %
HCT: 42 % (ref 36.0–46.0)
Hemoglobin: 13.7 g/dL (ref 12.0–15.0)
Immature Granulocytes: 0 %
Lymphocytes Relative: 24 %
Lymphs Abs: 2.6 10*3/uL (ref 0.7–4.0)
MCH: 30.5 pg (ref 26.0–34.0)
MCHC: 32.6 g/dL (ref 30.0–36.0)
MCV: 93.5 fL (ref 80.0–100.0)
Monocytes Absolute: 0.7 10*3/uL (ref 0.1–1.0)
Monocytes Relative: 7 %
Neutro Abs: 7.1 10*3/uL (ref 1.7–7.7)
Neutrophils Relative %: 67 %
Platelets: 216 10*3/uL (ref 150–400)
RBC: 4.49 MIL/uL (ref 3.87–5.11)
RDW: 14 % (ref 11.5–15.5)
WBC: 10.6 10*3/uL — ABNORMAL HIGH (ref 4.0–10.5)
nRBC: 0 % (ref 0.0–0.2)

## 2024-01-20 LAB — BASIC METABOLIC PANEL WITH GFR
Anion gap: 10 (ref 5–15)
BUN: 14 mg/dL (ref 6–20)
CO2: 26 mmol/L (ref 22–32)
Calcium: 9.4 mg/dL (ref 8.9–10.3)
Chloride: 104 mmol/L (ref 98–111)
Creatinine, Ser: 0.72 mg/dL (ref 0.44–1.00)
GFR, Estimated: >60 mL/min (ref >60)
Glucose, Bld: 121 mg/dL — ABNORMAL HIGH (ref 70–99)
Potassium: 3.5 mmol/L (ref 3.5–5.1)
Sodium: 140 mmol/L (ref 135–145)

## 2024-01-20 LAB — URINALYSIS, ROUTINE W REFLEX MICROSCOPIC
Bilirubin Urine: NEGATIVE
Glucose, UA: NEGATIVE mg/dL
Ketones, ur: NEGATIVE mg/dL
Nitrite: NEGATIVE
Protein, ur: 100 mg/dL — AB
RBC / HPF: >50 RBC/hpf (ref 0–5)
Specific Gravity, Urine: 1.025 (ref 1.005–1.030)
pH: 5 (ref 5.0–8.0)

## 2024-01-20 LAB — POC OCCULT BLOOD, ED: Fecal Occult Bld: POSITIVE — AB

## 2024-01-20 MED ORDER — ALBUTEROL SULFATE HFA 108 (90 BASE) MCG/ACT IN AERS
2.0000 | INHALATION_SPRAY | RESPIRATORY_TRACT | Status: DC
  Administered 2024-01-20: 2 via RESPIRATORY_TRACT

## 2024-01-20 MED ORDER — OXYCODONE-ACETAMINOPHEN 5-325 MG PO TABS
1.0000 | ORAL_TABLET | Freq: Once | ORAL | Status: AC
  Administered 2024-01-20: 1 via ORAL
  Filled 2024-01-20: qty 1

## 2024-01-20 MED ORDER — CEPHALEXIN 500 MG PO CAPS: 500.0000 mg | ORAL_CAPSULE | Freq: Four times a day (QID) | ORAL | 0 refills | Status: AC

## 2024-01-20 MED ORDER — OXYCODONE-ACETAMINOPHEN 5-325 MG PO TABS: 1.0000 | ORAL_TABLET | ORAL | 0 refills | Status: AC | PRN

## 2024-01-20 NOTE — Discharge Instructions (Signed)
 The x-ray of your hip today was reassuring.  Your other workup shows that you have a urinary tract infection.  You have been treated with antibiotics for this.  Please take as directed until finished.  Please drink plenty of water as well.  You will need to follow-up with GI regarding your rectal bleeding.  Will likely need colonoscopy.  Someone from Dr. John Muzzy office should be contacting you but call his office to arrange follow-up appointment if you do not hear from them return to the emergency department for any new or worsening symptoms.

## 2024-01-20 NOTE — ED Provider Notes (Signed)
 [place] EMERGENCY DEPARTMENT AT [facility] Provider Note   CSN: [id number] Arrival date & time: 01/20/24  1350     History  Chief Complaint  Patient presents with   Hip Pain    Sandra Orr is a 53 y.o. female.   Hip Pain Pertinent negatives include no chest pain, no abdominal pain, no headaches and no shortness of breath.       Sandra Orr is a 53 y.o. female past medical history of type 2 diabetes, obesity, arthritis, GERD, asthma and anxiety, who presents to the Emergency Department complaining of right hip pain bilateral knee pain and bright red blood per rectum.  She has been having knee pain for some time, has seen orthopedics for her knee pain and told she needs knee replacements.  She has been having pain to her right hip for several days.  Pain of her hip radiates into her lateral thigh area.  Worse with standing or walking.  She denies known injury.  She noticed bright red blood from her rectum yesterday after bowel movement.  It occurred again today.  She denies any generalized weakness, abdominal pain, fever chills nausea or vomiting.  No excessive diarrhea or constipation.  States her stools have been soft.  She denies using over-the-counter Goody powders, aspirin or ibuprofen .  Home Medications Prior to Admission medications   Medication Sig Start Date End Date Taking? Authorizing Provider  albuterol  (VENTOLIN  HFA) 108 (90 Base) MCG/ACT inhaler Inhale 2 puffs into the lungs every 6 (six) hours as needed for wheezing or shortness of breath. 08/19/23   Sofia, Leslie K, PA-C  gabapentin  (NEURONTIN ) 300 MG capsule 1 tab in the morning, 1 tab lunch time, 2 tabs at bedtime 11/18/23   Jacobs, Bret C, DO  hydrocortisone  (ANUSOL -HC) 2.5 % rectal cream Place 1 Application rectally 2 (two) times daily. 07/11/22   Orvilla Blander, MD  ibuprofen  (ADVIL ) 800 MG tablet Take 1 tablet (800 mg total) by mouth every 8 (eight) hours as needed for moderate pain. 09/27/22    Idol, Julie, PA-C  oxyCODONE -acetaminophen  (PERCOCET/ROXICET) 5-325 MG tablet Take 1 tablet by mouth every 8 (eight) hours as needed for severe pain. 09/27/22   Idol, Julie, PA-C  predniSONE  (DELTASONE ) 10 MG tablet Take 2 tablets (20 mg total) by mouth 2 (two) times daily. 06/22/22   Orvilla Blander, MD      Allergies    Penicillins    Review of Systems   Review of Systems  Constitutional:  Negative for chills and fever.  Respiratory:  Negative for shortness of breath.   Cardiovascular:  Negative for chest pain.  Gastrointestinal:  Positive for blood in stool. Negative for abdominal pain, diarrhea, nausea and vomiting.  Genitourinary:  Negative for decreased urine volume, difficulty urinating, dysuria and flank pain.  Musculoskeletal:  Positive for arthralgias (Bilateral knee pain, right hip pain). Negative for back pain and neck pain.  Skin:  Negative for color change and wound.  Neurological:  Negative for dizziness, light-headedness and headaches.    Physical Exam Updated Vital Signs BP (!) 141/79 (BP Location: Right Wrist)   Pulse 78   Temp 97.6 F (36.4 C)   Resp 20   Ht 5\' 6"  (1.676 m)   Wt (!) 147 kg   LMP 05/06/2016   SpO2 95%   BMI 52.31 kg/m  Physical Exam Vitals and nursing note reviewed.  Constitutional:      General: She is not in acute distress.    Appearance:  Normal appearance. She is obese. She is not ill-appearing or toxic-appearing.  Cardiovascular:     Rate and Rhythm: Normal rate and regular rhythm.     Pulses: Normal pulses.  Pulmonary:     Effort: Pulmonary effort is normal.  Abdominal:     General: There is no distension.     Palpations: Abdomen is soft.     Tenderness: There is no abdominal tenderness.  Genitourinary:    Rectum: Guaiac result positive. No tenderness, anal fissure or external hemorrhoid. Normal anal tone.     Comments: Heme positive stool on DRE.  No palpable rectal masses.  No abnormal tone or external  hemorrhoids. Musculoskeletal:        General: Tenderness present. No swelling or signs of injury.     Right hip: Tenderness present. No crepitus. Normal range of motion. Normal strength.     Right lower leg: No edema.     Left lower leg: No edema.     Comments: Tenderness palpation on range of motion right hip.  No bony deformities or crepitus.  Tenderness on range of motion bilateral knees.  No edema or palpable effusion.  No excessive warmth or erythema.  Skin:    General: Skin is warm.     Capillary Refill: Capillary refill takes less than 2 seconds.  Neurological:     General: No focal deficit present.     Mental Status: She is alert.     Sensory: No sensory deficit.     Motor: No weakness.     ED Results / Procedures / Treatments   Labs (all labs ordered are listed, but only abnormal results are displayed) Labs Reviewed  CBC WITH DIFFERENTIAL/PLATELET - Abnormal; Notable for the following components:      Result Value   WBC 10.6 (*)    All other components within normal limits  BASIC METABOLIC PANEL WITH GFR - Abnormal; Notable for the following components:   Glucose, Bld 121 (*)    All other components within normal limits  URINALYSIS, ROUTINE W REFLEX MICROSCOPIC - Abnormal; Notable for the following components:   APPearance HAZY (*)    Hgb urine dipstick LARGE (*)    Protein, ur 100 (*)    Leukocytes,Ua MODERATE (*)    Bacteria, UA RARE (*)    All other components within normal limits  POC OCCULT BLOOD, ED - Abnormal; Notable for the following components:   Fecal Occult Bld POSITIVE (*)    All other components within normal limits  URINE CULTURE    EKG None  Radiology DG Hip Unilat W or Wo Pelvis 2-3 Views Right Result Date: 01/20/2024 CLINICAL DATA:  Worsening right hip pain. EXAM: DG HIP (WITH OR WITHOUT PELVIS) 2-3V RIGHT COMPARISON:  January 17, 2021. FINDINGS: There is no evidence of hip fracture or dislocation. There is no evidence of arthropathy or other focal  bone abnormality. IMPRESSION: Negative. Electronically Signed   By: Rosalene Colon M.D.   On: 01/20/2024 15:23    Procedures Procedures    Medications Ordered in ED Medications - No data to display  ED Course/ Medical Decision Making/ A&P                                 Medical Decision Making Patient here with bilateral knee pain right hip pain.  Knee pain is chronic she is followed by orthopedics for this.  She is awaiting replacement.  Began having right hip pain several days ago.  No known injury.  Also noticed bright red blood per rectum yesterday.  Denies NSAID use, no history of peptic ulcers.  Denies any abdominal pain.  On exam, patient able to ambulate, gait slightly ataxic.  No focal neurodeficits.  She has heme positive stool on DRE out palpable masses or external hemorrhoid  Hip pain in absence of trauma, no recent procedures illness fever or chills to suggest a septic joint.  Suspect this is musculoskeletal and likely secondary to her antalgic gait.  Bright red blood from her rectum with unclear cause at this time.  She denies excessive NSAIDs no history of previous GI bleed no rectal or abdominal pain.  Will check labs, she had x-rays of her knees in 2020 that showed significant degenerative changes.  No recent fall to indicate need for repeat imaging of her knees.  Amount and/or Complexity of Data Reviewed Labs: ordered.    Details: Labs no significant leukocytosis, hemoglobin 13.7 chemistries reassuring.  Hemoccult positive stool  Urinalysis shows possible cystitis Radiology: ordered.    Details: X-ray of the right hip is negative for acute bony finding Discussion of management or test interpretation with external provider(s): Patient here with knee pain imaging without evidence of fracture or dislocation.  Extremity neurovascularly intact.  She also complains of bright red blood per rectum.  Cause of this is unclear at this time.  She denies any excessive NSAID use no  prior history of rectal bleeding.  Her hemoglobin today is reassuring she appears hemodynamically stable.  No history of prior colonoscopy  Will treat for possible cystitis, culture pending  I have consulted with GI, Dr. Alita Irwin who agrees to see patient in close clinic follow-up and plan for colonoscopy  Risk Prescription drug management.           Final Clinical Impression(s) / ED Diagnoses Final diagnoses:  Right hip pain  Hematochezia  Acute cystitis with hematuria    Rx / DC Orders ED Discharge Orders     None         Catherne Clubs, PA-C 01/20/24 2317    Merdis Stalling, MD 01/21/24 1659

## 2024-01-20 NOTE — ED Triage Notes (Signed)
 Pt arrived via POV c/o worsening right hip pain. Pt also reports she noticed blood in her stool yesterday. Pt also requests a refill of her inhaler.Pt reports hip pain radiates down her leg and she knows she needs knee surgery.

## 2024-01-21 ENCOUNTER — Telehealth (INDEPENDENT_AMBULATORY_CARE_PROVIDER_SITE_OTHER): Payer: Self-pay | Admitting: *Deleted

## 2024-01-21 LAB — URINE CULTURE: Culture: NO GROWTH

## 2024-01-21 NOTE — Telephone Encounter (Signed)
 Patient is schd 02/02/24 at 210 pm with Dr Alita Irwin left detailed message for patient

## 2024-01-21 NOTE — Telephone Encounter (Signed)
-----   Message from Hargis Lias sent at 01/20/2024  5:57 PM EDT ----- Regarding: appt Hi Mysti Haley/Susan  ED called me about this patient , can she be scheduled for a clinic visit with me for blood per rectum ----- Message ----- From: Catherne Clubs, PA-C Sent: 01/20/2024   5:53 PM EDT To: Hargis Lias, MD  Hi Dr. Alita Irwin  This is the patient that we spoke about that had heme positive stool, hemoglobin 13.7.  No previous colonoscopy.  She denies NSAID use  Thank you for seeing her  Catherne Clubs, PA-C

## 2024-02-02 ENCOUNTER — Ambulatory Visit (INDEPENDENT_AMBULATORY_CARE_PROVIDER_SITE_OTHER): Admitting: Gastroenterology

## 2024-04-07 ENCOUNTER — Encounter: Payer: Self-pay | Admitting: Radiology

## 2024-06-19 ENCOUNTER — Encounter: Payer: Self-pay | Admitting: Radiology
